# Patient Record
Sex: Male | Born: 1955
Health system: Southern US, Community
[De-identification: ages and names within clinical notes are randomized; demographics above are authoritative.]

## PROBLEM LIST (undated history)

## (undated) DIAGNOSIS — D72829 Elevated white blood cell count, unspecified: Secondary | ICD-10-CM

## (undated) DIAGNOSIS — J189 Pneumonia, unspecified organism: Secondary | ICD-10-CM

## (undated) DIAGNOSIS — E785 Hyperlipidemia, unspecified: Secondary | ICD-10-CM

## (undated) DIAGNOSIS — M5136 Other intervertebral disc degeneration, lumbar region: Secondary | ICD-10-CM

## (undated) DIAGNOSIS — F419 Anxiety disorder, unspecified: Secondary | ICD-10-CM

## (undated) DIAGNOSIS — I10 Essential (primary) hypertension: Secondary | ICD-10-CM

## (undated) DIAGNOSIS — M51369 Other intervertebral disc degeneration, lumbar region without mention of lumbar back pain or lower extremity pain: Secondary | ICD-10-CM

## (undated) HISTORY — DX: Other intervertebral disc degeneration, lumbar region without mention of lumbar back pain or lower extremity pain: M51.369

## (undated) HISTORY — DX: Essential (primary) hypertension: I10

## (undated) HISTORY — DX: Hyperlipidemia, unspecified: E78.5

## (undated) HISTORY — DX: Elevated white blood cell count, unspecified: D72.829

## (undated) HISTORY — PX: TONSILLECTOMY: SUR1361

## (undated) HISTORY — DX: Other intervertebral disc degeneration, lumbar region: M51.36

## (undated) HISTORY — PX: TOTAL HIP ARTHROPLASTY: SHX124

---

## 2011-06-11 ENCOUNTER — Encounter (HOSPITAL_COMMUNITY): Payer: 59 | Attending: Oncology | Admitting: Oncology

## 2011-06-11 ENCOUNTER — Encounter (HOSPITAL_COMMUNITY): Payer: Self-pay | Admitting: Oncology

## 2011-06-11 VITALS — BP 124/76 | HR 84 | Temp 97.9°F | Ht 71.0 in | Wt 202.6 lb

## 2011-06-11 DIAGNOSIS — B029 Zoster without complications: Secondary | ICD-10-CM

## 2011-06-11 DIAGNOSIS — D72829 Elevated white blood cell count, unspecified: Secondary | ICD-10-CM | POA: Insufficient documentation

## 2011-06-11 DIAGNOSIS — J449 Chronic obstructive pulmonary disease, unspecified: Secondary | ICD-10-CM | POA: Insufficient documentation

## 2011-06-11 DIAGNOSIS — F17200 Nicotine dependence, unspecified, uncomplicated: Secondary | ICD-10-CM | POA: Insufficient documentation

## 2011-06-11 DIAGNOSIS — J4489 Other specified chronic obstructive pulmonary disease: Secondary | ICD-10-CM | POA: Insufficient documentation

## 2011-06-11 LAB — DIFFERENTIAL
Eosinophils Relative: 1 % (ref 0–5)
Lymphocytes Relative: 25 % (ref 12–46)
Monocytes Absolute: 0.8 10*3/uL (ref 0.1–1.0)
Monocytes Relative: 7 % (ref 3–12)
Neutro Abs: 7.9 10*3/uL — ABNORMAL HIGH (ref 1.7–7.7)

## 2011-06-11 LAB — CBC
HCT: 41.8 % (ref 39.0–52.0)
Hemoglobin: 15.2 g/dL (ref 13.0–17.0)
MCHC: 36.4 g/dL — ABNORMAL HIGH (ref 30.0–36.0)
MCV: 92.7 fL (ref 78.0–100.0)
RDW: 13.1 % (ref 11.5–15.5)
WBC: 11.8 10*3/uL — ABNORMAL HIGH (ref 4.0–10.5)

## 2011-06-11 NOTE — Patient Instructions (Signed)
[facility]  Discharge Instructions  RECOMMENDATIONS MADE BY THE CONSULTANT AND ANY TEST RESULTS WILL BE SENT TO YOUR REFERRING DOCTOR.   EXAM FINDINGS BY MD TODAY AND SIGNS AND SYMPTOMS TO REPORT TO CLINIC OR PRIMARY MD: need to stop smoking.  Will try you on zyban to see if it will help.  MEDICATIONS PRESCRIBED: zyban start with 1 pill at bedtime for 3 nights then 1 pill 2 times daily   INSTRUCTIONS GIVEN AND DISCUSSED: Advance Medical Directives  SPECIAL INSTRUCTIONS/FOLLOW-UP:  CT of chest in October then labs and MD appointment in November  I acknowledge that I have been informed and understand all the instructions given to me and received a copy. I do not have any more questions at this time, but understand that I may call the Specialty Clinic at [facility] at [phone] during business hours should I have any further questions or need assistance in obtaining follow-up care.    __________________________________________  _____________  __________ Signature of Patient or Authorized Representative            Date                   Time    __________________________________________ Nurse's Signature

## 2011-06-11 NOTE — Progress Notes (Signed)
Addended by: Sterling Big on: 06/11/2011 03:36 PM   Modules accepted: Orders

## 2011-06-11 NOTE — Progress Notes (Signed)
ict

## 2011-06-11 NOTE — Progress Notes (Signed)
CC:   Helene Kelp, PA-C  DIAGNOSES: 1. Leukocytosis, unclear etiology. 2. History of herpes zoster, February, 2012 unprovoked. 3. Chronic obstructive pulmonary disease with a greater than 40 to 50     pack year history of smoking. He smokes a pack and a half of     cigarettes a day, has done so for 30 years essentially. 4. History of tonsillectomy many years ago as a child. 5. Hypercholesterolemia.  This is a very pleasant gentleman, 55 year old, who I think looks slightly older than his stated age, who was referred because of chronic elevation of his white cells since July of last year. I do not have blood work prior to that. At that time in July of 2011, his white count was 11,600 with a slight increase in lymphocytes and granulocytes.  His hemoglobin and platelets were normal at that time. MCV was 99.5.  He had a white count then in December, 2011 of 11,000.  Normal hemoglobin and normal platelets and this time the differential showed a slight increase in lymphocytes to 4000.  His neutrophils were normal.  This year in August his white count was 12,900 at this time with 9,500 so it is a slight increase, normal numbers of lymphocytes and normal hemoglobin and normal platelet count.  He does cough at times. He does not bring up phlegm and does not bring up blood. He is not losing weight. Appetite is excellent. Bowel function is fine, but he has not had  colonoscopy ever.  He has no GU functional problems.  He is not aware of any lumps or bumps anywhere.  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  He is married. He has one son. His wife and his son are nonsmokers and they are both in good health he states.  He does use about 4 beers a day and again he smokes a pack and a half a day.  CURRENT MEDICATIONS:  His medications include atorvastatin, Benicar, hydrochlorothiazide and hydrocodone p.r.n. for pain.  ALLERGIES:  Penicillin.  PHYSICAL EXAMINATION:   General:  He is a pleasant gentleman in no acute distress.  His height is 5 feet 11 inches.  Weight is 202 pounds.  BMI 28.3.  Blood pressure 120/76 right arm sitting position.  Pulse 80 and regular.  Respiratory rate 14 to 16 and unlabored.  He has no obvious hepatosplenomegaly. He does have a lymph node that is about a centimeter and a half in the left supraclavicular fossa, but it is soft. There is a questionable small lymph node about 5 to 6 mm in the right supraclavicular fossa and low neck area on the right.  I did not feel enlarged inguinal nodes, axillary nodes, etc.  He had no infraclavicular nodes. His lungs showed diminished breath sounds, but right now they were clear.  His heart showed a regular rhythm and rate without murmur, rub or gallop. He had no gynecomastia. He did have a patch of scars on the right lateral mid axillary line around T6 consistent with old zoster. He states that he actually had that this February and forgot to mention it in his history.  He has no leg edema, pulse 1 to 2 plus and symmetric.  Nails are unremarkable. He is alert, he is oriented.  Facial symmetry is intact. Throat is clear. Tongue is normal to midline. Pupils are equally round and reactive to light.  Teeth are in fair repair.  This gentleman I think has some  things to worry about, maybe mild leukocytosis, but it is not a single cell type, so I do not think we need to send off flow cytometry at this juncture.  I would like to repeat a CBC and definitely do a smear.  Next, he has longstanding smoking history and he had unprovoked shingles. He did not have infection or surgery, etc. I do think that he needs a CT of the chest to rule out underlying or occult lung malignancy. He needs a repeat CBC and diff after 8 weeks of cessation of smoking to see if his bronchitis, which is chronic, is causing his elevation in leukocytes and I would like him to come back and see me in 8 weeks. I would like  to try him on Zyban if he is willing to try that after discussing it, I think that might help him. I really need to see what his counts are off cigarettes. He is willing to proceed with this.  We will see him as scheduled.    ______________________________ Ladona Horns. Mariel Sleet, MD ESN/MEDQ  D:  06/11/2011  T:  06/11/2011  Job:  [id number]

## 2011-06-27 ENCOUNTER — Other Ambulatory Visit (HOSPITAL_COMMUNITY): Payer: Self-pay | Admitting: Oncology

## 2011-06-27 ENCOUNTER — Ambulatory Visit (HOSPITAL_COMMUNITY)
Admission: RE | Admit: 2011-06-27 | Discharge: 2011-06-27 | Disposition: A | Discharge status: Home / Self Care | Payer: 59 | Source: Ambulatory Visit | Attending: Oncology | Admitting: Oncology

## 2011-06-27 DIAGNOSIS — B028 Zoster with other complications: Secondary | ICD-10-CM | POA: Insufficient documentation

## 2011-06-27 DIAGNOSIS — R911 Solitary pulmonary nodule: Secondary | ICD-10-CM

## 2011-06-27 DIAGNOSIS — F17200 Nicotine dependence, unspecified, uncomplicated: Secondary | ICD-10-CM | POA: Insufficient documentation

## 2011-06-27 DIAGNOSIS — R7989 Other specified abnormal findings of blood chemistry: Secondary | ICD-10-CM | POA: Insufficient documentation

## 2011-06-27 DIAGNOSIS — D72829 Elevated white blood cell count, unspecified: Secondary | ICD-10-CM

## 2011-08-06 ENCOUNTER — Encounter (HOSPITAL_COMMUNITY): Payer: 59 | Attending: Oncology

## 2011-08-06 ENCOUNTER — Encounter (HOSPITAL_BASED_OUTPATIENT_CLINIC_OR_DEPARTMENT_OTHER): Payer: 59 | Admitting: Oncology

## 2011-08-06 VITALS — BP 138/73 | HR 78 | Temp 98.0°F | Wt 202.0 lb

## 2011-08-06 DIAGNOSIS — J449 Chronic obstructive pulmonary disease, unspecified: Secondary | ICD-10-CM | POA: Insufficient documentation

## 2011-08-06 DIAGNOSIS — J4489 Other specified chronic obstructive pulmonary disease: Secondary | ICD-10-CM | POA: Insufficient documentation

## 2011-08-06 DIAGNOSIS — D72829 Elevated white blood cell count, unspecified: Secondary | ICD-10-CM | POA: Insufficient documentation

## 2011-08-06 LAB — CBC
HCT: 45.4 % (ref 39.0–52.0)
MCH: 32.9 pg (ref 26.0–34.0)
MCHC: 35.5 g/dL (ref 30.0–36.0)
MCV: 92.8 fL (ref 78.0–100.0)
Platelets: 208 10*3/uL (ref 150–400)
RDW: 12.9 % (ref 11.5–15.5)
WBC: 8.5 10*3/uL (ref 4.0–10.5)

## 2011-08-06 LAB — DIFFERENTIAL
Basophils Absolute: 0.1 10*3/uL (ref 0.0–0.1)
Basophils Relative: 1 % (ref 0–1)
Eosinophils Absolute: 0.1 10*3/uL (ref 0.0–0.7)
Eosinophils Relative: 2 % (ref 0–5)
Lymphocytes Relative: 28 % (ref 12–46)
Monocytes Absolute: 0.7 10*3/uL (ref 0.1–1.0)

## 2011-08-06 MED ORDER — BUPROPION HCL ER (SMOKING DET) 150 MG PO TB12: 150.0000 mg | ORAL_TABLET | Freq: Two times a day (BID) | ORAL | Status: DC

## 2011-08-06 NOTE — Progress Notes (Signed)
This office note has been dictated.

## 2011-08-06 NOTE — Patient Instructions (Signed)
Louis [name]  [id number] 1956/02/03   [facility]  Discharge Instructions  RECOMMENDATIONS MADE BY THE CONSULTANT AND ANY TEST RESULTS WILL BE SENT TO YOUR REFERRING DOCTOR.   EXAM FINDINGS BY MD TODAY AND SIGNS AND SYMPTOMS TO REPORT TO CLINIC OR PRIMARY MD: Exam findings are good per Dr. Mariel Sleet.  Please continue to work to eliminate your use of tobacco/smoking.  MEDICATIONS PRESCRIBED: Zyban reordered with refills x 3.  I acknowledge that I have been informed and understand all the instructions given to me and received a copy. I do not have any more questions at this time, but understand that I may call the Specialty Clinic at [facility] at [phone] during business hours should I have any further questions or need assistance in obtaining follow-up care.    __________________________________________  _____________  __________ Signature of Patient or Authorized Representative            Date                   Time    __________________________________________ Nurse's Signature

## 2011-08-06 NOTE — Progress Notes (Signed)
Labs drawn today for cbc,diff 

## 2011-08-06 NOTE — Progress Notes (Signed)
CC:   Ernestina Penna, M.D.  DIAGNOSES: 1. Leukocytosis, which is now resolved. 2. Chronic obstructive pulmonary disease secondary to a longstanding     smoking history and he has been able to quit now for the last 4 to     5 weeks. 3. History of herpes zoster in February 2012. 4. Tonsillectomy years ago as a child. 5. Hypercholesterolemia.  Mr. Olmeda is here today for followup.  He has been able to quit smoking and interestingly his white count is perfectly normal at 8500, differential is unremarkable.  Hemoglobin is normal at 16.1 g, platelets normal at 208,000, differential again unremarkable, and a CT scan of the chest without contrast done on October 12th revealed that he has centrilobular emphysema without question.  There is no evidence for lung cancer, however.  There was a subpleural lingular nodule felt to be a lymph node, but with his smoking history we need to do another low-dose CT scan next year either way.  Again, there was no evidence of malignancy.  With his blood counts perfectly normal and he is doing very, very well on the Zyban 150 mg twice a day, we will continue the Zyban, call in refills x3, and see him back in late February to repeat his counts to make sure that they are staying the same.  I did review his blood smear today, which shows an occasional large platelet.  He has some atypical lymphocytes, but no malignant-appearing cells.  He had 1 myelocyte seen versus a metamyelocyte, but I believe it was a myelocyte.  He had several 5-lobed polys, but they appeared normal.  So we will see him back.  As mentioned, he is doing very well right now, asymptomatic. Again, his blood counts are all in the normal range with an unremarkable differential.  So will let him go, see him back in 2013 as planned and a CT scan needs to be done next year.    ______________________________ Ladona Horns. Mariel Sleet, MD ESN/MEDQ  D:  08/06/2011  T:  08/06/2011  Job:  [id number]

## 2011-11-11 ENCOUNTER — Ambulatory Visit (HOSPITAL_COMMUNITY): Payer: 59 | Admitting: Oncology

## 2011-11-11 ENCOUNTER — Other Ambulatory Visit (HOSPITAL_COMMUNITY): Payer: 59

## 2012-01-05 ENCOUNTER — Other Ambulatory Visit (HOSPITAL_COMMUNITY): Payer: Self-pay | Admitting: Oncology

## 2012-01-05 DIAGNOSIS — J449 Chronic obstructive pulmonary disease, unspecified: Secondary | ICD-10-CM

## 2012-01-05 DIAGNOSIS — D72829 Elevated white blood cell count, unspecified: Secondary | ICD-10-CM

## 2012-01-05 MED ORDER — BUPROPION HCL ER (SMOKING DET) 150 MG PO TB12: 150.0000 mg | ORAL_TABLET | Freq: Two times a day (BID) | ORAL | Status: DC

## 2012-08-17 ENCOUNTER — Other Ambulatory Visit (HOSPITAL_COMMUNITY): Payer: Self-pay | Admitting: Oncology

## 2012-08-17 DIAGNOSIS — D72829 Elevated white blood cell count, unspecified: Secondary | ICD-10-CM

## 2012-08-17 DIAGNOSIS — J449 Chronic obstructive pulmonary disease, unspecified: Secondary | ICD-10-CM

## 2012-08-17 MED ORDER — BUPROPION HCL ER (SMOKING DET) 150 MG PO TB12: 150.0000 mg | ORAL_TABLET | Freq: Two times a day (BID) | ORAL | Status: DC

## 2012-12-30 ENCOUNTER — Telehealth: Payer: Self-pay | Admitting: Physician Assistant

## 2012-12-30 DIAGNOSIS — I10 Essential (primary) hypertension: Secondary | ICD-10-CM

## 2012-12-30 DIAGNOSIS — D72829 Elevated white blood cell count, unspecified: Secondary | ICD-10-CM

## 2012-12-30 NOTE — Telephone Encounter (Signed)
Patient requested a cheaper antihypertensive than Benicar at his appt in Nov 2013 with Helene Kelp, PA-C.  He was switched to Amlodipine 10mg  and has noticed swelling in his feet when he takes it.  He has tried alternating not taking it for a week at a time and the swelling resolves when he isn't on it. He would like to switch back to Benicar/HCT 40/12.5.   (I set the prescription up for you to refill if you approve it.  Samples would be appreciated as well.)

## 2012-12-31 NOTE — Telephone Encounter (Signed)
Let us use Losartan/hct 100/25 one daily #30 Refill x 1. instead OV in 6 weeks. D/C the norvasc.

## 2013-01-03 MED ORDER — LOSARTAN POTASSIUM-HCTZ 100-25 MG PO TABS: 1.0000 | ORAL_TABLET | Freq: Every day | ORAL | Status: DC

## 2013-01-03 NOTE — Telephone Encounter (Signed)
Losartan is equivalent to Benicar and cheaper.

## 2013-01-03 NOTE — Telephone Encounter (Signed)
PT AWARE TO STOP THE NORVASC AND PICK UP NEW RX.

## 2013-02-26 ENCOUNTER — Other Ambulatory Visit: Payer: Self-pay | Admitting: *Deleted

## 2013-02-26 DIAGNOSIS — I10 Essential (primary) hypertension: Secondary | ICD-10-CM

## 2013-02-26 NOTE — Telephone Encounter (Signed)
You filled this last time, last seen 12/13, no future appts scheduled. If denied nurse must let him know

## 2013-02-28 MED ORDER — LOSARTAN POTASSIUM-HCTZ 100-25 MG PO TABS: 1.0000 | ORAL_TABLET | Freq: Every day | ORAL | Status: DC

## 2013-02-28 NOTE — Telephone Encounter (Signed)
Due for Office visit

## 2013-04-30 ENCOUNTER — Other Ambulatory Visit: Payer: Self-pay | Admitting: Family Medicine

## 2013-05-03 NOTE — Telephone Encounter (Signed)
Last seen 07/21/12  ACM

## 2013-05-06 ENCOUNTER — Ambulatory Visit (INDEPENDENT_AMBULATORY_CARE_PROVIDER_SITE_OTHER): Payer: 59 | Admitting: Family Medicine

## 2013-05-06 VITALS — BP 198/100 | HR 88 | Temp 98.7°F | Ht 72.0 in | Wt 215.0 lb

## 2013-05-06 DIAGNOSIS — M549 Dorsalgia, unspecified: Secondary | ICD-10-CM

## 2013-05-06 DIAGNOSIS — D72829 Elevated white blood cell count, unspecified: Secondary | ICD-10-CM

## 2013-05-06 DIAGNOSIS — J069 Acute upper respiratory infection, unspecified: Secondary | ICD-10-CM

## 2013-05-06 DIAGNOSIS — E785 Hyperlipidemia, unspecified: Secondary | ICD-10-CM

## 2013-05-06 DIAGNOSIS — I10 Essential (primary) hypertension: Secondary | ICD-10-CM

## 2013-05-06 MED ORDER — DOXYCYCLINE HYCLATE 50 MG PO CAPS: 50.0000 mg | ORAL_CAPSULE | Freq: Two times a day (BID) | ORAL | Status: DC

## 2013-05-06 MED ORDER — METOPROLOL TARTRATE 50 MG PO TABS: 50.0000 mg | ORAL_TABLET | Freq: Two times a day (BID) | ORAL | Status: DC

## 2013-05-06 MED ORDER — CLONIDINE HCL 0.1 MG PO TABS
0.1000 mg | ORAL_TABLET | Freq: Once | ORAL | Status: AC
  Administered 2013-05-06: 0.1 mg via ORAL

## 2013-05-06 MED ORDER — HYDROCODONE-ACETAMINOPHEN 5-325 MG PO TABS: 1.0000 | ORAL_TABLET | Freq: Four times a day (QID) | ORAL | Status: DC | PRN

## 2013-05-06 MED ORDER — ATORVASTATIN CALCIUM 40 MG PO TABS: 40.0000 mg | ORAL_TABLET | Freq: Every day | ORAL | Status: DC

## 2013-05-06 MED ORDER — METHOCARBAMOL 750 MG PO TABS: 750.0000 mg | ORAL_TABLET | Freq: Two times a day (BID) | ORAL | Status: DC | PRN

## 2013-05-06 MED ORDER — LOSARTAN POTASSIUM-HCTZ 100-25 MG PO TABS: 1.0000 | ORAL_TABLET | Freq: Every day | ORAL | Status: DC

## 2013-05-06 NOTE — Patient Instructions (Signed)

## 2013-05-06 NOTE — Progress Notes (Signed)
  Subjective:    Patient ID: Louis [name], male    DOB: [birth year], 57 y.o.   MRN: [id number]  HPI  This 57 y.o. male presents for evaluation of hypertension.  He has been out of his blood pressure medicine For over 3 months.  He has hx of hyperlipidemia.  He has c/o uri sx's.  Review of Systems No chest pain, SOB, HA, dizziness, vision change, N/V, diarrhea, constipation, dysuria, urinary urgency or frequency, myalgias, arthralgias or rash.     Objective:   Physical Exam Vital signs noted  Well developed well nourished male.  HEENT - Head atraumatic Normocephalic                Eyes - PERRLA, Conjuctiva - clear Sclera- Clear EOMI                Ears - EAC's Wnl TM's Wnl Gross Hearing WNL                Nose - Nares patent                 Throat - oropharanx wnl Respiratory - Lungs CTA bilateral Cardiac - RRR S1 and S2 without murmur GI - Abdomen soft Nontender and bowel sounds active x 4 Extremities - No edema. Neuro - Grossly intact.       Assessment & Plan:  HTN (hypertension) - Plan: cloNIDine (CATAPRES) tablet 0.1 mg, losartan-hydrochlorothiazide (HYZAAR) 100-25 MG per tablet, metoprolol (LOPRESSOR) 50 MG tablet.  Clonidine 0.1mg  po given in clinic and bp comes down to 170/100. Patient advised to get on the lopressor and hyzaar today.   Back pain - Plan: methocarbamol (ROBAXIN) 750 MG tablet, metoprolol (LOPRESSOR) 50 MG tablet  Other and unspecified hyperlipidemia - Plan: atorvastatin (LIPITOR) 40 MG tablet, metoprolol (LOPRESSOR) 50 MG tablet  URI, acute - Plan: doxycycline (VIBRAMYCIN) 50 MG capsule.  Follow up in one month and prn

## 2013-05-09 ENCOUNTER — Telehealth: Payer: Self-pay | Admitting: Family Medicine

## 2013-05-09 ENCOUNTER — Other Ambulatory Visit: Payer: Self-pay | Admitting: Family Medicine

## 2013-05-09 DIAGNOSIS — M549 Dorsalgia, unspecified: Secondary | ICD-10-CM

## 2013-05-09 MED ORDER — HYDROCODONE-ACETAMINOPHEN 5-325 MG PO TABS: 1.0000 | ORAL_TABLET | Freq: Four times a day (QID) | ORAL | Status: DC | PRN

## 2013-05-09 NOTE — Telephone Encounter (Signed)
Tell him to come in and p/u rx

## 2013-05-09 NOTE — Telephone Encounter (Signed)
Patient aware.

## 2013-06-28 ENCOUNTER — Other Ambulatory Visit: Payer: Self-pay | Admitting: Nurse Practitioner

## 2013-06-30 NOTE — Telephone Encounter (Signed)
Patient needs to be seen. Has exceeded time since last visit. Limited quantity refilled. Needs to bring all medications to next appointment.   

## 2013-06-30 NOTE — Telephone Encounter (Signed)
LAST OV 11/13. MED NOT IN EPIC BUT IN PAPER CHART. LAST RF 9/14.

## 2013-07-04 NOTE — Telephone Encounter (Signed)
Left message for pt to return call to sch appt

## 2013-07-22 ENCOUNTER — Ambulatory Visit (INDEPENDENT_AMBULATORY_CARE_PROVIDER_SITE_OTHER): Payer: 59 | Admitting: Family Medicine

## 2013-07-22 ENCOUNTER — Encounter: Payer: Self-pay | Admitting: Family Medicine

## 2013-07-22 VITALS — BP 135/75 | HR 81 | Temp 97.3°F | Ht 72.0 in | Wt 212.2 lb

## 2013-07-22 DIAGNOSIS — M1990 Unspecified osteoarthritis, unspecified site: Secondary | ICD-10-CM

## 2013-07-22 DIAGNOSIS — M549 Dorsalgia, unspecified: Secondary | ICD-10-CM

## 2013-07-22 DIAGNOSIS — Z0000 Encounter for general adult medical examination without abnormal findings: Secondary | ICD-10-CM

## 2013-07-22 DIAGNOSIS — M129 Arthropathy, unspecified: Secondary | ICD-10-CM

## 2013-07-22 DIAGNOSIS — I10 Essential (primary) hypertension: Secondary | ICD-10-CM

## 2013-07-22 LAB — POCT CBC
Granulocyte percent: 58.7 %G (ref 37–80)
HCT, POC: 49.1 % (ref 43.5–53.7)
Hemoglobin: 16.7 g/dL (ref 14.1–18.1)
Lymph, poc: 3.8 — AB (ref 0.6–3.4)
MCH, POC: 31.5 pg — AB (ref 27–31.2)
MCHC: 34 g/dL (ref 31.8–35.4)
MCV: 92.6 fL (ref 80–97)
MPV: 7.3 fL (ref 0–99.8)
POC Granulocyte: 6.1 (ref 2–6.9)
POC LYMPH PERCENT: 36.7 %L (ref 10–50)
Platelet Count, POC: 241 10*3/uL (ref 142–424)
RBC: 5.3 M/uL (ref 4.69–6.13)
RDW, POC: 12.5 %
WBC: 10.4 10*3/uL — AB (ref 4.6–10.2)

## 2013-07-22 MED ORDER — MELOXICAM 15 MG PO TABS: 15.0000 mg | ORAL_TABLET | Freq: Every day | ORAL | Status: DC

## 2013-07-22 MED ORDER — HYDROCODONE-ACETAMINOPHEN 5-325 MG PO TABS: 1.0000 | ORAL_TABLET | Freq: Four times a day (QID) | ORAL | Status: DC | PRN

## 2013-07-22 NOTE — Patient Instructions (Signed)

## 2013-07-22 NOTE — Progress Notes (Signed)
  Subjective:    Patient ID: Louis [name], male    DOB: Jun 17, 1956, 57 y.o.   MRN: [id number]  HPI This 57 y.o. male presents for evaluation of hypertension.  Patient  Has persistent back pain and wants refill on pain meds.  He has Been having arthralgias.   Review of Systems C/o back pain. No chest pain, SOB, HA, dizziness, vision change, N/V, diarrhea, constipation, dysuria, urinary urgency or rash.     Objective:   Physical Exam Vital signs noted  Well developed well nourished male.  HEENT - Head atraumatic Normocephalic                Eyes - PERRLA, Conjuctiva - clear Sclera- Clear EOMI                Ears - EAC's Wnl TM's Wnl Gross Hearing WNL                Nose - Nares patent                 Throat - oropharanx wnl Respiratory - Lungs CTA bilateral Cardiac - RRR S1 and S2 without murmur GI - Abdomen soft Nontender and bowel sounds active x 4 Extremities - No edema. Neuro - Grossly intact.       Assessment & Plan:  Arthritis - Plan: POCT CBC, Uric acid, meloxicam (MOBIC) 15 MG tablet, HYDROcodone-acetaminophen (NORCO) 5-325 MG per tablet  Routine general medical examination at a health care facility - Plan: POCT CBC, Lipid panel, PSA, total and free, Uric acid, Thyroid Panel With TSH, CMP14+EGFR  Unspecified essential hypertension - Plan: POCT CBC  Back pain - Plan: HYDROcodone-acetaminophen (NORCO) 5-325 MG per tablet  Deatra Canter FNP

## 2013-07-23 LAB — CMP14+EGFR
ALT: 29 IU/L (ref 0–44)
AST: 22 IU/L (ref 0–40)
Albumin/Globulin Ratio: 2.2 (ref 1.1–2.5)
Albumin: 4.8 g/dL (ref 3.5–5.5)
Alkaline Phosphatase: 94 IU/L (ref 39–117)
BUN/Creatinine Ratio: 14 (ref 9–20)
BUN: 11 mg/dL (ref 6–24)
CO2: 30 mmol/L — ABNORMAL HIGH (ref 18–29)
Calcium: 9.5 mg/dL (ref 8.7–10.2)
Chloride: 103 mmol/L (ref 97–108)
Creatinine, Ser: 0.78 mg/dL (ref 0.76–1.27)
GFR calc Af Amer: 117 mL/min/{1.73_m2} (ref >59)
GFR calc non Af Amer: 101 mL/min/{1.73_m2} (ref >59)
Globulin, Total: 2.2 g/dL (ref 1.5–4.5)
Glucose: 102 mg/dL — ABNORMAL HIGH (ref 65–99)
Potassium: 4.9 mmol/L (ref 3.5–5.2)
Sodium: 144 mmol/L (ref 134–144)
Total Bilirubin: 0.7 mg/dL (ref 0.0–1.2)
Total Protein: 7 g/dL (ref 6.0–8.5)

## 2013-07-23 LAB — LIPID PANEL
Chol/HDL Ratio: 2.9 ratio units (ref 0.0–5.0)
Cholesterol, Total: 132 mg/dL (ref 100–199)
HDL: 46 mg/dL (ref >39)
LDL Calculated: 51 mg/dL (ref 0–99)
Triglycerides: 174 mg/dL — ABNORMAL HIGH (ref 0–149)
VLDL Cholesterol Cal: 35 mg/dL (ref 5–40)

## 2013-07-23 LAB — PSA, TOTAL AND FREE
PSA, Free Pct: 40 %
PSA, Free: 0.16 ng/mL
PSA: 0.4 ng/mL (ref 0.0–4.0)

## 2013-07-23 LAB — THYROID PANEL WITH TSH
Free Thyroxine Index: 2.2 (ref 1.2–4.9)
T3 Uptake Ratio: 30 % (ref 24–39)
T4, Total: 7.2 ug/dL (ref 4.5–12.0)
TSH: 0.959 u[IU]/mL (ref 0.450–4.500)

## 2013-07-23 LAB — URIC ACID: Uric Acid: 5.5 mg/dL (ref 3.7–8.6)

## 2013-09-30 ENCOUNTER — Ambulatory Visit (INDEPENDENT_AMBULATORY_CARE_PROVIDER_SITE_OTHER): Payer: 59 | Admitting: Family Medicine

## 2013-09-30 VITALS — BP 147/69 | HR 49 | Temp 96.9°F | Ht 72.0 in | Wt 216.0 lb

## 2013-09-30 DIAGNOSIS — J209 Acute bronchitis, unspecified: Secondary | ICD-10-CM

## 2013-09-30 MED ORDER — LEVALBUTEROL HCL 1.25 MG/0.5ML IN NEBU: 1.2500 mg | INHALATION_SOLUTION | Freq: Once | RESPIRATORY_TRACT | Status: DC

## 2013-09-30 MED ORDER — LEVALBUTEROL HCL 1.25 MG/3ML IN NEBU
1.2500 mg | INHALATION_SOLUTION | Freq: Once | RESPIRATORY_TRACT | Status: AC
  Administered 2013-09-30: 1.25 mg via RESPIRATORY_TRACT

## 2013-09-30 MED ORDER — AZITHROMYCIN 250 MG PO TABS: ORAL_TABLET | ORAL | Status: DC

## 2013-09-30 MED ORDER — HYDROCODONE-HOMATROPINE 5-1.5 MG/5ML PO SYRP: 5.0000 mL | ORAL_SOLUTION | Freq: Three times a day (TID) | ORAL | Status: DC | PRN

## 2013-09-30 MED ORDER — METHYLPREDNISOLONE ACETATE 80 MG/ML IJ SUSP
80.0000 mg | Freq: Once | INTRAMUSCULAR | Status: AC
  Administered 2013-09-30: 80 mg via INTRAMUSCULAR

## 2013-09-30 NOTE — Progress Notes (Signed)
   Subjective:    Patient ID: Louis [name], male    DOB: [birth year], 58 y.o.   MRN: [id number]  HPI This 58 y.o. male presents for evaluation of persistent dry hacky cough and uri sx's for Over a week.  He is coughing a lot he states.  He is a cigarette smoker and is trying To cut back he states.   Review of Systems C/o cough and bronchitis sx's   No chest pain, SOB, HA, dizziness, vision change, N/V, diarrhea, constipation, dysuria, urinary urgency or frequency, myalgias, arthralgias or rash.  Objective:   Physical Exam Vital signs noted  Well developed well nourished male.  HEENT - Head atraumatic Normocephalic                Eyes - PERRLA, Conjuctiva - clear Sclera- Clear EOMI                Ears - EAC's Wnl TM's Wnl Gross Hearing WNL                Nose - Nares decreased and boggy                Throat - oropharanx wnl Respiratory - Lungs with expiratory wheezes  Cardiac - RRR S1 and S2 without murmur GI - Abdomen soft Nontender and bowel sounds active x 4   Post neb - Lungs CTA bilateral     Assessment & Plan:  Acute bronchitis - Plan: levalbuterol (XOPENEX) nebulizer solution 1.25 mg, methylPREDNISolone acetate (DEPO-MEDROL) injection 80 mg, azithromycin (ZITHROMAX) 250 MG tablet, HYDROcodone-homatropine (HYCODAN) 5-1.5 MG/5ML syrup.  Symbicort 80/4.5 one puff bid #1 sample given and instructions given on how to use.  Recommend mucinex otc bid for next 2 weeks.  Push po fluids, rest, tylenol and motrin otc prn as directed for fever, arthralgias, and myalgias.  Follow up prn if sx's continue or persist.  Discused DC smoking at length.    Lysbeth Penner FNP

## 2013-09-30 NOTE — Patient Instructions (Signed)

## 2013-10-04 ENCOUNTER — Telehealth: Payer: Self-pay | Admitting: Family Medicine

## 2013-10-05 ENCOUNTER — Other Ambulatory Visit: Payer: Self-pay | Admitting: Family Medicine

## 2013-10-05 DIAGNOSIS — J209 Acute bronchitis, unspecified: Secondary | ICD-10-CM

## 2013-10-05 MED ORDER — HYDROCODONE-HOMATROPINE 5-1.5 MG/5ML PO SYRP: 5.0000 mL | ORAL_SOLUTION | Freq: Three times a day (TID) | ORAL | Status: DC | PRN

## 2013-10-05 NOTE — Telephone Encounter (Signed)
Pt notified RX for Hycodan 158ml was ready for pick up Verbalizes understanding

## 2013-12-15 ENCOUNTER — Telehealth: Payer: Self-pay | Admitting: Family Medicine

## 2013-12-15 NOTE — Telephone Encounter (Signed)
appt for next Friday with bill

## 2013-12-23 ENCOUNTER — Encounter: Payer: Self-pay | Admitting: Family Medicine

## 2013-12-23 ENCOUNTER — Ambulatory Visit (INDEPENDENT_AMBULATORY_CARE_PROVIDER_SITE_OTHER): Payer: 59 | Admitting: Family Medicine

## 2013-12-23 ENCOUNTER — Ambulatory Visit (INDEPENDENT_AMBULATORY_CARE_PROVIDER_SITE_OTHER): Payer: 59

## 2013-12-23 VITALS — BP 122/65 | HR 48 | Temp 97.1°F | Ht 72.0 in

## 2013-12-23 DIAGNOSIS — M7741 Metatarsalgia, right foot: Secondary | ICD-10-CM

## 2013-12-23 DIAGNOSIS — M79609 Pain in unspecified limb: Secondary | ICD-10-CM

## 2013-12-23 DIAGNOSIS — M549 Dorsalgia, unspecified: Secondary | ICD-10-CM

## 2013-12-23 DIAGNOSIS — M129 Arthropathy, unspecified: Secondary | ICD-10-CM

## 2013-12-23 DIAGNOSIS — M7750 Other enthesopathy of unspecified foot: Secondary | ICD-10-CM

## 2013-12-23 DIAGNOSIS — M79671 Pain in right foot: Secondary | ICD-10-CM

## 2013-12-23 DIAGNOSIS — M1990 Unspecified osteoarthritis, unspecified site: Secondary | ICD-10-CM

## 2013-12-23 MED ORDER — NAPROXEN 500 MG PO TABS
500.0000 mg | ORAL_TABLET | Freq: Two times a day (BID) | ORAL | Status: DC
Start: 2013-12-23 — End: 2014-03-30

## 2013-12-23 MED ORDER — HYDROCODONE-ACETAMINOPHEN 5-325 MG PO TABS: 1.0000 | ORAL_TABLET | Freq: Four times a day (QID) | ORAL | Status: DC | PRN

## 2013-12-23 NOTE — Progress Notes (Signed)
   Subjective:    Patient ID: Louis [name], male    DOB: [birth year], 58 y.o.   MRN: [id number]  HPI This 58 y.o. male presents for evaluation of right foot discomfort.   Review of Systems C/o right foot pain No chest pain, SOB, HA, dizziness, vision change, N/V, diarrhea, constipation, dysuria, urinary urgency or frequency, myalgias, arthralgias or rash.     Objective:   Physical Exam Vital signs noted  Well developed well nourished male in NAD Respiratory - Lungs CTA bilateral Cardiac - RRR S1 and S2 without murmur MS - TTP right metatarsal region  Xray Right foot - no fracture     Assessment & Plan:  Right foot pain - Plan: DG Foot Complete Right, naproxen (NAPROSYN) 500 MG tablet, Ambulatory referral to Orthopedic Surgery, HYDROcodone-acetaminophen (NORCO) 5-325 MG per tablet  Metatarsalgia of right foot - Plan: naproxen (NAPROSYN) 500 MG tablet, Ambulatory referral to Orthopedic Surgery, HYDROcodone-acetaminophen (NORCO) 5-325 MG per tablet  Lysbeth Penner FNP

## 2014-03-30 ENCOUNTER — Encounter: Payer: Self-pay | Admitting: Family Medicine

## 2014-03-30 ENCOUNTER — Ambulatory Visit (INDEPENDENT_AMBULATORY_CARE_PROVIDER_SITE_OTHER): Payer: 59 | Admitting: Family Medicine

## 2014-03-30 VITALS — BP 164/82 | HR 50 | Temp 97.6°F | Ht 72.0 in | Wt 215.8 lb

## 2014-03-30 DIAGNOSIS — M545 Low back pain, unspecified: Secondary | ICD-10-CM

## 2014-03-30 DIAGNOSIS — M1990 Unspecified osteoarthritis, unspecified site: Secondary | ICD-10-CM

## 2014-03-30 DIAGNOSIS — M79671 Pain in right foot: Secondary | ICD-10-CM

## 2014-03-30 DIAGNOSIS — M79609 Pain in unspecified limb: Secondary | ICD-10-CM

## 2014-03-30 DIAGNOSIS — M129 Arthropathy, unspecified: Secondary | ICD-10-CM

## 2014-03-30 DIAGNOSIS — M7750 Other enthesopathy of unspecified foot: Secondary | ICD-10-CM

## 2014-03-30 DIAGNOSIS — M7741 Metatarsalgia, right foot: Secondary | ICD-10-CM

## 2014-03-30 MED ORDER — MELOXICAM 15 MG PO TABS: 15.0000 mg | ORAL_TABLET | Freq: Every day | ORAL | Status: DC

## 2014-03-30 MED ORDER — HYDROCODONE-ACETAMINOPHEN 5-325 MG PO TABS: 1.0000 | ORAL_TABLET | Freq: Four times a day (QID) | ORAL | Status: DC | PRN

## 2014-03-30 NOTE — Progress Notes (Signed)
   Subjective:    Patient ID: Louis [name], male    DOB: [birth year], 58 y.o.   MRN: [id number]  HPI  This 58 y.o. male presents for evaluation of severe back pain.  He wants refill on his norco for pain.  He has hx of metatarsalgia and was rx'd pain meds which have helped.  He has hx of arthritis pain and he has seen ortho for inserts for metatarsalgia and this has helped.  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  Review of Systems C/o back pain   No chest pain, SOB, HA, dizziness, vision change, N/V, diarrhea, constipation, dysuria, urinary urgency or frequency, myalgias, arthralgias or rash.  Objective:   Physical Exam Vital signs noted  Well developed well nourished male.  HEENT - Head atraumatic Normocephalic                Eyes - PERRLA, Conjuctiva - clear Sclera- Clear EOMI                Ears - EAC's Wnl TM's Wnl Gross Hearing WNL                Throat - oropharanx wnl Respiratory - Lungs CTA bilateral Cardiac - RRR S1 and S2 without murmur GI - Abdomen soft Nontender and bowel sounds active x 4 MS - TTP LS spine and decreased ROM LS spine.       Assessment & Plan:  Arthritis - Plan: meloxicam (MOBIC) 15 MG tablet, HYDROcodone-acetaminophen (NORCO) 5-325 MG per tablet  Bilateral low back pain without sciatica - Plan: meloxicam (MOBIC) 15 MG tablet, HYDROcodone-acetaminophen (NORCO) 5-325 MG per tablet. Sciatic doughnut  Right foot pain - Improved  Metatarsalgia of right foot - Improved and continue inserts for shoes.  Lysbeth Penner FNP

## 2014-05-10 ENCOUNTER — Other Ambulatory Visit: Payer: Self-pay | Admitting: Family Medicine

## 2014-05-23 ENCOUNTER — Other Ambulatory Visit: Payer: Self-pay | Admitting: Family Medicine

## 2014-05-23 NOTE — Telephone Encounter (Signed)
Last lipids 11/14. ntbs

## 2014-06-02 ENCOUNTER — Ambulatory Visit (INDEPENDENT_AMBULATORY_CARE_PROVIDER_SITE_OTHER): Payer: 59 | Admitting: Family Medicine

## 2014-06-02 ENCOUNTER — Encounter: Payer: Self-pay | Admitting: Family Medicine

## 2014-06-02 VITALS — BP 199/69 | HR 44 | Temp 97.5°F | Ht 72.0 in | Wt 215.4 lb

## 2014-06-02 DIAGNOSIS — M129 Arthropathy, unspecified: Secondary | ICD-10-CM

## 2014-06-02 DIAGNOSIS — M1990 Unspecified osteoarthritis, unspecified site: Secondary | ICD-10-CM

## 2014-06-02 DIAGNOSIS — G4700 Insomnia, unspecified: Secondary | ICD-10-CM

## 2014-06-02 DIAGNOSIS — Z23 Encounter for immunization: Secondary | ICD-10-CM

## 2014-06-02 DIAGNOSIS — E785 Hyperlipidemia, unspecified: Secondary | ICD-10-CM

## 2014-06-02 DIAGNOSIS — M545 Low back pain, unspecified: Secondary | ICD-10-CM

## 2014-06-02 DIAGNOSIS — Z1211 Encounter for screening for malignant neoplasm of colon: Secondary | ICD-10-CM

## 2014-06-02 MED ORDER — PITAVASTATIN CALCIUM 2 MG PO TABS: 2.0000 mg | ORAL_TABLET | ORAL | Status: DC

## 2014-06-02 MED ORDER — DICYCLOMINE HCL 10 MG PO CAPS
10.0000 mg | ORAL_CAPSULE | Freq: Three times a day (TID) | ORAL | Status: DC
Start: 2014-06-02 — End: 2015-04-04

## 2014-06-02 MED ORDER — HYDROCODONE-ACETAMINOPHEN 5-325 MG PO TABS: 1.0000 | ORAL_TABLET | Freq: Four times a day (QID) | ORAL | Status: DC | PRN

## 2014-06-02 NOTE — Progress Notes (Signed)
   Subjective:    Patient ID: Louis [name], male    DOB: Aug 13, 1956, 58 y.o.   MRN: [id number]  HPI Patient comes in with a list of things he would like addressed.  He needs tdap, he need refill on lortab which he takes for back pain and arthritis, he wants to change lipid meds because he is having a lot of myalgias and arthralgias, he would like to try some medicine for insomnia but nothing that he can get addicted to and he would like a muscle relaxer but one that does not cause somnolence and that is affordable through his insurance company.  He would also like referral to GI for colonoscopy.   Review of Systems No chest pain, SOB, HA, dizziness, vision change, N/V, diarrhea, constipation, dysuria, urinary urgency or frequency, myalgias, arthralgias or rash.     Objective:   Physical Exam  Vital signs noted  Well developed well nourished male.  HEENT - Head atraumatic Normocephalic                Eyes - PERRLA, Conjuctiva - clear Sclera- Clear EOMI                Ears - EAC's Wnl TM's Wnl Gross Hearing WNL                Nose - Nares patent                 Throat - oropharanx wnl Respiratory - Lungs CTA bilateral Cardiac - RRR S1 and S2 without murmur GI - Abdomen soft Nontender and bowel sounds active x 4 Extremities - No edema. Neuro - Grossly intact.      Assessment & Plan:  Arthritis - Plan: HYDROcodone-acetaminophen (NORCO) 5-325 MG per tablet and take mobic.  Bilateral low back pain without sciatica - Plan: HYDROcodone-acetaminophen (NORCO) 5-325 MG per tablet  Insomnia - otc melatonin  Other and unspecified hyperlipidemia - DC lipitor and start livalo  Myalgias and muscle cramps - DC lipitor and try livalo and take bentyl 10mg  po qid prn for muscles spasms.  GI referral for colonscopy  TDAP  Lysbeth Penner FNP

## 2014-07-06 ENCOUNTER — Encounter: Payer: Self-pay | Admitting: Family Medicine

## 2014-08-03 ENCOUNTER — Telehealth: Payer: Self-pay | Admitting: Family Medicine

## 2014-08-03 NOTE — Telephone Encounter (Signed)
Appointment scheduled for 12/3 at 10:15

## 2014-08-17 ENCOUNTER — Encounter: Payer: Self-pay | Admitting: Family Medicine

## 2014-08-17 ENCOUNTER — Ambulatory Visit (INDEPENDENT_AMBULATORY_CARE_PROVIDER_SITE_OTHER): Payer: 59 | Admitting: Family Medicine

## 2014-08-17 VITALS — BP 155/77 | HR 50 | Temp 97.0°F | Ht 72.0 in | Wt 209.0 lb

## 2014-08-17 DIAGNOSIS — Z139 Encounter for screening, unspecified: Secondary | ICD-10-CM

## 2014-08-17 DIAGNOSIS — M545 Low back pain, unspecified: Secondary | ICD-10-CM

## 2014-08-17 DIAGNOSIS — E785 Hyperlipidemia, unspecified: Secondary | ICD-10-CM

## 2014-08-17 DIAGNOSIS — M1990 Unspecified osteoarthritis, unspecified site: Secondary | ICD-10-CM

## 2014-08-17 DIAGNOSIS — M5441 Lumbago with sciatica, right side: Secondary | ICD-10-CM

## 2014-08-17 DIAGNOSIS — I10 Essential (primary) hypertension: Secondary | ICD-10-CM

## 2014-08-17 MED ORDER — METHOCARBAMOL 750 MG PO TABS: 750.0000 mg | ORAL_TABLET | Freq: Two times a day (BID) | ORAL | Status: DC | PRN

## 2014-08-17 MED ORDER — HYDROCODONE-ACETAMINOPHEN 5-325 MG PO TABS: 1.0000 | ORAL_TABLET | Freq: Four times a day (QID) | ORAL | Status: DC | PRN

## 2014-08-17 NOTE — Addendum Note (Signed)
Addended by: Earlene Plater on: 08/17/2014 03:06 PM   Modules accepted: Orders, SmartSet

## 2014-08-17 NOTE — Addendum Note (Signed)
Addended by: Earlene Plater on: 08/17/2014 03:10 PM   Modules accepted: Orders, SmartSet

## 2014-08-17 NOTE — Progress Notes (Signed)
   Subjective:    Patient ID: Louis [name], male    DOB: August 17, 1956, 58 y.o.   MRN: [id number]  HPI Patient is here for CPE and refill on pain medication.  He has had flu shot this year.  He has hx of chronic back pain.   Review of Systems  Constitutional: Negative for fever.  HENT: Negative for ear pain.   Eyes: Negative for discharge.  Respiratory: Negative for cough.   Cardiovascular: Negative for chest pain.  Gastrointestinal: Negative for abdominal distention.  Endocrine: Negative for polyuria.  Genitourinary: Negative for difficulty urinating.  Musculoskeletal: Negative for gait problem and neck pain.  Skin: Negative for color change and rash.  Neurological: Negative for speech difficulty and headaches.  Psychiatric/Behavioral: Negative for agitation.       Objective:    BP 155/77 mmHg  Pulse 50  Temp(Src) 97 F (36.1 C) (Oral)  Ht 6' (1.829 m)  Wt 209 lb (94.802 kg)  BMI 28.34 kg/m2 Physical Exam  Constitutional: He is oriented to person, place, and time. He appears well-developed and well-nourished.  HENT:  Head: Normocephalic and atraumatic.  Mouth/Throat: Oropharynx is clear and moist.  Eyes: Pupils are equal, round, and reactive to light.  Neck: Normal range of motion. Neck supple.  Cardiovascular: Normal rate and regular rhythm.   No murmur heard. Pulmonary/Chest: Effort normal and breath sounds normal.  Abdominal: Soft. Bowel sounds are normal. There is no tenderness.  Neurological: He is alert and oriented to person, place, and time.  Skin: Skin is warm and dry.  Psychiatric: He has a normal mood and affect.          Assessment & Plan:     ICD-9-CM ICD-10-CM   1. Hyperlipemia 272.4 E78.5 Lipid panel  2. Essential hypertension, benign 401.1 I10 POCT CBC     CMP14+EGFR  3. Screening V82.9 Z13.9 PSA, total and free     Thyroid Panel With TSH  4. Low back pain with right-sided sciatica, unspecified back pain laterality 724.3 M54.41 methocarbamol  (ROBAXIN) 750 MG tablet  5. Arthritis 716.90 M19.90 HYDROcodone-acetaminophen (NORCO) 5-325 MG per tablet  6. Bilateral low back pain without sciatica 724.2 M54.5 HYDROcodone-acetaminophen (NORCO) 5-325 MG per tablet     Return if symptoms worsen or fail to improve.  Lysbeth Penner FNP

## 2014-08-18 LAB — PSA, TOTAL AND FREE
PSA, Free Pct: 32.5 %
PSA, Free: 0.13 ng/mL
PSA: 0.4 ng/mL (ref 0.0–4.0)

## 2014-08-18 LAB — CMP14+EGFR
ALT: 47 IU/L — ABNORMAL HIGH (ref 0–44)
AST: 33 IU/L (ref 0–40)
Albumin/Globulin Ratio: 1.9 (ref 1.1–2.5)
Albumin: 4.5 g/dL (ref 3.5–5.5)
Alkaline Phosphatase: 81 IU/L (ref 39–117)
BUN/Creatinine Ratio: 10 (ref 9–20)
BUN: 9 mg/dL (ref 6–24)
CO2: 26 mmol/L (ref 18–29)
Calcium: 9.8 mg/dL (ref 8.7–10.2)
Chloride: 101 mmol/L (ref 97–108)
Creatinine, Ser: 0.94 mg/dL (ref 0.76–1.27)
GFR calc Af Amer: 104 mL/min/{1.73_m2} (ref >59)
GFR calc non Af Amer: 90 mL/min/{1.73_m2} (ref >59)
Globulin, Total: 2.4 g/dL (ref 1.5–4.5)
Glucose: 105 mg/dL — ABNORMAL HIGH (ref 65–99)
Potassium: 5.5 mmol/L — ABNORMAL HIGH (ref 3.5–5.2)
Sodium: 142 mmol/L (ref 134–144)
Total Bilirubin: 1.1 mg/dL (ref 0.0–1.2)
Total Protein: 6.9 g/dL (ref 6.0–8.5)

## 2014-08-18 LAB — CBC WITH DIFFERENTIAL
Basophils Absolute: 0.1 10*3/uL (ref 0.0–0.2)
Basos: 1 %
Eos: 2 %
Eosinophils Absolute: 0.2 10*3/uL (ref 0.0–0.4)
HCT: 47.3 % (ref 37.5–51.0)
Hemoglobin: 16.7 g/dL (ref 12.6–17.7)
Immature Grans (Abs): 0 10*3/uL (ref 0.0–0.1)
Immature Granulocytes: 0 %
Lymphocytes Absolute: 4.1 10*3/uL — ABNORMAL HIGH (ref 0.7–3.1)
Lymphs: 38 %
MCH: 33.1 pg — ABNORMAL HIGH (ref 26.6–33.0)
MCHC: 35.3 g/dL (ref 31.5–35.7)
MCV: 94 fL (ref 79–97)
Monocytes Absolute: 0.8 10*3/uL (ref 0.1–0.9)
Monocytes: 7 %
Neutrophils Absolute: 5.7 10*3/uL (ref 1.4–7.0)
Neutrophils Relative %: 52 %
Platelets: 235 10*3/uL (ref 150–379)
RBC: 5.05 x10E6/uL (ref 4.14–5.80)
RDW: 13.1 % (ref 12.3–15.4)
WBC: 10.8 10*3/uL (ref 3.4–10.8)

## 2014-08-18 LAB — THYROID PANEL WITH TSH
Free Thyroxine Index: 2.1 (ref 1.2–4.9)
T3 Uptake Ratio: 28 % (ref 24–39)
T4, Total: 7.6 ug/dL (ref 4.5–12.0)
TSH: 0.798 u[IU]/mL (ref 0.450–4.500)

## 2014-08-18 LAB — LIPID PANEL
Chol/HDL Ratio: 2.6 ratio units (ref 0.0–5.0)
Cholesterol, Total: 136 mg/dL (ref 100–199)
HDL: 52 mg/dL (ref >39)
LDL Calculated: 63 mg/dL (ref 0–99)
Triglycerides: 103 mg/dL (ref 0–149)
VLDL Cholesterol Cal: 21 mg/dL (ref 5–40)

## 2014-08-25 ENCOUNTER — Other Ambulatory Visit (INDEPENDENT_AMBULATORY_CARE_PROVIDER_SITE_OTHER): Payer: 59

## 2014-08-25 DIAGNOSIS — E875 Hyperkalemia: Secondary | ICD-10-CM

## 2014-08-25 NOTE — Progress Notes (Signed)
Lab only 

## 2014-08-26 LAB — BMP8+EGFR
BUN/Creatinine Ratio: 13 (ref 9–20)
BUN: 11 mg/dL (ref 6–24)
CO2: 26 mmol/L (ref 18–29)
Calcium: 8.9 mg/dL (ref 8.7–10.2)
Chloride: 101 mmol/L (ref 97–108)
Creatinine, Ser: 0.88 mg/dL (ref 0.76–1.27)
GFR calc Af Amer: 110 mL/min/{1.73_m2} (ref >59)
GFR calc non Af Amer: 95 mL/min/{1.73_m2} (ref >59)
Glucose: 90 mg/dL (ref 65–99)
Potassium: 4.4 mmol/L (ref 3.5–5.2)
Sodium: 141 mmol/L (ref 134–144)

## 2014-08-28 ENCOUNTER — Telehealth: Payer: Self-pay | Admitting: *Deleted

## 2014-08-28 NOTE — Telephone Encounter (Signed)
Aware, normal potassium.

## 2014-08-28 NOTE — Telephone Encounter (Signed)
-----   Message from Lysbeth Penner, FNP sent at 08/28/2014 10:22 AM EST ----- K is normal

## 2014-10-13 ENCOUNTER — Ambulatory Visit (INDEPENDENT_AMBULATORY_CARE_PROVIDER_SITE_OTHER): Payer: 59 | Admitting: Family Medicine

## 2014-10-13 ENCOUNTER — Encounter: Payer: Self-pay | Admitting: Family Medicine

## 2014-10-13 VITALS — BP 172/80 | HR 44 | Temp 97.0°F | Ht 72.0 in | Wt 209.0 lb

## 2014-10-13 DIAGNOSIS — J069 Acute upper respiratory infection, unspecified: Secondary | ICD-10-CM

## 2014-10-13 DIAGNOSIS — I10 Essential (primary) hypertension: Secondary | ICD-10-CM

## 2014-10-13 MED ORDER — METHYLPREDNISOLONE ACETATE 80 MG/ML IJ SUSP
80.0000 mg | Freq: Once | INTRAMUSCULAR | Status: AC
  Administered 2014-10-13: 80 mg via INTRAMUSCULAR

## 2014-10-13 MED ORDER — AZITHROMYCIN 250 MG PO TABS: ORAL_TABLET | ORAL | Status: DC

## 2014-10-13 MED ORDER — AMLODIPINE BESYLATE 5 MG PO TABS
5.0000 mg | ORAL_TABLET | Freq: Every day | ORAL | Status: DC
Start: 2014-10-13 — End: 2015-04-04

## 2014-10-13 NOTE — Progress Notes (Signed)
   Subjective:    Patient ID: Louis [name], male    DOB: 1956/02/03, 59 y.o.   MRN: [id number]  HPI Patient c/o uri sinus congestion and fatigue.  He is only taking metoprolol qd instead of BID due to bradycardia.  He has been having elevated bp readings.  Review of Systems  Constitutional: Negative for fever.  HENT: Negative for ear pain.   Eyes: Negative for discharge.  Respiratory: Negative for cough.   Cardiovascular: Negative for chest pain.  Gastrointestinal: Negative for abdominal distention.  Endocrine: Negative for polyuria.  Genitourinary: Negative for difficulty urinating.  Musculoskeletal: Negative for gait problem and neck pain.  Skin: Negative for color change and rash.  Neurological: Negative for speech difficulty and headaches.  Psychiatric/Behavioral: Negative for agitation.       Objective:    BP 172/80 mmHg  Pulse 44  Temp(Src) 97 F (36.1 C) (Oral)  Ht 6' (1.829 m)  Wt 209 lb (94.802 kg)  BMI 28.34 kg/m2 Physical Exam  Constitutional: He is oriented to person, place, and time. He appears well-developed and well-nourished.  HENT:  Head: Normocephalic and atraumatic.  Mouth/Throat: Oropharynx is clear and moist.  Eyes: Pupils are equal, round, and reactive to light.  Neck: Normal range of motion. Neck supple.  Cardiovascular: Normal rate and regular rhythm.   No murmur heard. Pulmonary/Chest: Effort normal and breath sounds normal.  Abdominal: Soft. Bowel sounds are normal. There is no tenderness.  Neurological: He is alert and oriented to person, place, and time.  Skin: Skin is warm and dry.  Psychiatric: He has a normal mood and affect.          Assessment & Plan:     ICD-9-CM ICD-10-CM   1. URI (upper respiratory infection) 465.9 J06.9 azithromycin (ZITHROMAX) 250 MG tablet     methylPREDNISolone acetate (DEPO-MEDROL) injection 80 mg  2. Essential hypertension 401.9 I10 amLODipine (NORVASC) 5 MG tablet   Discussed with patient that he  needs to follow up in 2-3 weeks for BP follow up.  No Follow-up on file.  Lysbeth Penner FNP

## 2014-10-13 NOTE — Addendum Note (Signed)
Addended by: Marin Olp on: 10/13/2014 09:10 AM   Modules accepted: Miquel Dunn

## 2014-11-21 ENCOUNTER — Other Ambulatory Visit: Payer: Self-pay | Admitting: Family Medicine

## 2015-04-04 ENCOUNTER — Encounter: Payer: Self-pay | Admitting: Physician Assistant

## 2015-04-04 ENCOUNTER — Ambulatory Visit (INDEPENDENT_AMBULATORY_CARE_PROVIDER_SITE_OTHER): Payer: 59 | Admitting: Physician Assistant

## 2015-04-04 ENCOUNTER — Encounter (INDEPENDENT_AMBULATORY_CARE_PROVIDER_SITE_OTHER): Payer: Self-pay

## 2015-04-04 VITALS — BP 145/67 | HR 43 | Temp 97.1°F | Ht 72.0 in | Wt 200.8 lb

## 2015-04-04 DIAGNOSIS — I10 Essential (primary) hypertension: Secondary | ICD-10-CM

## 2015-04-04 DIAGNOSIS — E785 Hyperlipidemia, unspecified: Secondary | ICD-10-CM

## 2015-04-04 DIAGNOSIS — L03019 Cellulitis of unspecified finger: Secondary | ICD-10-CM

## 2015-04-04 MED ORDER — TERBINAFINE HCL 250 MG PO TABS: 250.0000 mg | ORAL_TABLET | Freq: Every day | ORAL | Status: DC

## 2015-04-04 MED ORDER — METOPROLOL TARTRATE 50 MG PO TABS: 50.0000 mg | ORAL_TABLET | Freq: Two times a day (BID) | ORAL | Status: DC

## 2015-04-04 MED ORDER — AMLODIPINE BESYLATE 5 MG PO TABS: 5.0000 mg | ORAL_TABLET | Freq: Every day | ORAL | Status: DC

## 2015-04-04 NOTE — Patient Instructions (Signed)

## 2015-04-04 NOTE — Progress Notes (Signed)
Subjective:     Patient ID: Louis [name], male   DOB: 1956-08-16, 59 y.o.   MRN: [id number]  HPI Pt here with several complaints #1- he is overdue for f/u of HTN and hyperlipid Denies CP,SOB, or lower ext edema #2- he has had a thickened nail to the R thumb Denies any trauma to the thumb or nail Sx x 3 months  Review of Systems  Constitutional: Negative.   HENT: Negative.   Eyes: Negative.   Respiratory: Negative.   Cardiovascular: Negative.   Gastrointestinal: Negative.        Objective:   Physical Exam  Constitutional: He appears well-developed and well-nourished.  HENT:  Mouth/Throat: Oropharynx is clear and moist. No oropharyngeal exudate.  Neck: Neck supple. No JVD present.  Cardiovascular: Normal rate, regular rhythm, normal heart sounds and intact distal pulses.   Pulmonary/Chest: Effort normal and breath sounds normal.  Musculoskeletal: He exhibits no edema.  Lymphadenopathy:    He has no cervical adenopathy.  Nursing note and vitals reviewed. Thickened nails to the thumbs bilat R>L R nail has curved under Note made of same to both of the great toenails bilat No erythema/edema to the nail bed     Assessment:     1. Essential hypertension   2. Onychia of finger, unspecified laterality   3. Hyperlipemia        Plan:     RF of meds for BP today Will start Lamisil for the nails Pt not to start until he hears about his lab results Informed of chronic nature and what to expect Recheck labs in 6 wks if he starts Lamisil Smoking cessation

## 2015-04-05 LAB — CMP14+EGFR
ALBUMIN: 4.6 g/dL (ref 3.5–5.5)
ALT: 26 IU/L (ref 0–44)
AST: 22 IU/L (ref 0–40)
Albumin/Globulin Ratio: 1.9 (ref 1.1–2.5)
Alkaline Phosphatase: 90 IU/L (ref 39–117)
BUN / CREAT RATIO: 13 (ref 9–20)
BUN: 10 mg/dL (ref 6–24)
Bilirubin Total: 0.8 mg/dL (ref 0.0–1.2)
CO2: 26 mmol/L (ref 18–29)
CREATININE: 0.78 mg/dL (ref 0.76–1.27)
Calcium: 9.7 mg/dL (ref 8.7–10.2)
Chloride: 101 mmol/L (ref 97–108)
GFR, EST AFRICAN AMERICAN: 116 mL/min/{1.73_m2} (ref >59)
GFR, EST NON AFRICAN AMERICAN: 100 mL/min/{1.73_m2} (ref >59)
GLOBULIN, TOTAL: 2.4 g/dL (ref 1.5–4.5)
GLUCOSE: 110 mg/dL — AB (ref 65–99)
POTASSIUM: 5.7 mmol/L — AB (ref 3.5–5.2)
SODIUM: 142 mmol/L (ref 134–144)
TOTAL PROTEIN: 7 g/dL (ref 6.0–8.5)

## 2015-04-05 LAB — LIPID PANEL
CHOL/HDL RATIO: 3.5 ratio (ref 0.0–5.0)
Cholesterol, Total: 161 mg/dL (ref 100–199)
HDL: 46 mg/dL (ref >39)
LDL CALC: 84 mg/dL (ref 0–99)
TRIGLYCERIDES: 157 mg/dL — AB (ref 0–149)
VLDL Cholesterol Cal: 31 mg/dL (ref 5–40)

## 2015-04-09 ENCOUNTER — Telehealth: Payer: Self-pay | Admitting: Family Medicine

## 2015-04-09 MED ORDER — LISINOPRIL 20 MG PO TABS: 20.0000 mg | ORAL_TABLET | Freq: Every day | ORAL | Status: DC

## 2015-04-09 NOTE — Telephone Encounter (Signed)
D/c amlodipine and changed to lisinopril - rx sent to pharmacy

## 2015-04-09 NOTE — Telephone Encounter (Signed)
Left detailed mssg regarding BP meds, and that labs will be reviewed soon

## 2015-04-13 ENCOUNTER — Other Ambulatory Visit: Payer: Self-pay | Admitting: *Deleted

## 2015-04-13 DIAGNOSIS — E875 Hyperkalemia: Secondary | ICD-10-CM

## 2015-06-29 ENCOUNTER — Other Ambulatory Visit: Payer: Self-pay | Admitting: *Deleted

## 2015-06-29 MED ORDER — PITAVASTATIN CALCIUM 2 MG PO TABS: 2.0000 mg | ORAL_TABLET | Freq: Every day | ORAL | Status: DC

## 2015-07-03 ENCOUNTER — Other Ambulatory Visit: Payer: Self-pay

## 2015-07-03 MED ORDER — PITAVASTATIN CALCIUM 2 MG PO TABS: 2.0000 mg | ORAL_TABLET | Freq: Every day | ORAL | Status: DC

## 2015-07-18 ENCOUNTER — Other Ambulatory Visit: Payer: Self-pay | Admitting: Nurse Practitioner

## 2015-08-08 ENCOUNTER — Other Ambulatory Visit: Payer: Self-pay | Admitting: Physician Assistant

## 2015-08-08 NOTE — Telephone Encounter (Signed)
Last seen 04/04/15  WLW

## 2015-09-03 ENCOUNTER — Ambulatory Visit (INDEPENDENT_AMBULATORY_CARE_PROVIDER_SITE_OTHER): Payer: 59 | Admitting: Family Medicine

## 2015-09-03 ENCOUNTER — Encounter: Payer: Self-pay | Admitting: Family Medicine

## 2015-09-03 VITALS — BP 132/83 | HR 85 | Temp 97.7°F | Ht 74.2 in | Wt 207.6 lb

## 2015-09-03 DIAGNOSIS — M549 Dorsalgia, unspecified: Secondary | ICD-10-CM | POA: Insufficient documentation

## 2015-09-03 DIAGNOSIS — I10 Essential (primary) hypertension: Secondary | ICD-10-CM

## 2015-09-03 DIAGNOSIS — M5441 Lumbago with sciatica, right side: Secondary | ICD-10-CM

## 2015-09-03 DIAGNOSIS — G5702 Lesion of sciatic nerve, left lower limb: Secondary | ICD-10-CM | POA: Diagnosis not present

## 2015-09-03 DIAGNOSIS — M1990 Unspecified osteoarthritis, unspecified site: Secondary | ICD-10-CM

## 2015-09-03 DIAGNOSIS — Z23 Encounter for immunization: Secondary | ICD-10-CM | POA: Diagnosis not present

## 2015-09-03 DIAGNOSIS — M545 Low back pain, unspecified: Secondary | ICD-10-CM

## 2015-09-03 DIAGNOSIS — Z0000 Encounter for general adult medical examination without abnormal findings: Secondary | ICD-10-CM

## 2015-09-03 DIAGNOSIS — L603 Nail dystrophy: Secondary | ICD-10-CM

## 2015-09-03 DIAGNOSIS — E785 Hyperlipidemia, unspecified: Secondary | ICD-10-CM

## 2015-09-03 MED ORDER — GABAPENTIN 100 MG PO CAPS: 100.0000 mg | ORAL_CAPSULE | Freq: Three times a day (TID) | ORAL | Status: DC

## 2015-09-03 MED ORDER — METHOCARBAMOL 750 MG PO TABS: 750.0000 mg | ORAL_TABLET | Freq: Two times a day (BID) | ORAL | Status: DC | PRN

## 2015-09-03 MED ORDER — PITAVASTATIN CALCIUM 2 MG PO TABS: 2.0000 mg | ORAL_TABLET | Freq: Every day | ORAL | Status: DC

## 2015-09-03 MED ORDER — HYDROCODONE-ACETAMINOPHEN 5-325 MG PO TABS: 1.0000 | ORAL_TABLET | Freq: Four times a day (QID) | ORAL | Status: DC | PRN

## 2015-09-03 NOTE — Patient Instructions (Signed)
Great to meet you!  I have started a new medicine, Called gabapentin Try 1 pill at night, if it doesn't make you sleepy you can take it 3 times daily. If you can tell its helping but the results are not complete you can increase up to 300 mg 3 times a day (call for new rx if this happens)  Lets see you back in 1 year unless you need [place] sooner.

## 2015-09-03 NOTE — Progress Notes (Signed)
   HPI  Patient presents today here for an annual physical exam and to discuss thickened nails and hypertension.  Thickened nails He has his right thumbnail that has been thickened and curling downward for over the last year, he was given a course of Lamisil which did not improve this problem. 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 He also has thickened yellow toenails as well.  Hypertension Medication compliance, he's only on lisinopril. No chest pain, dyspnea, palpitations, leg edema  Back pain left-sided low back pain described as buttock pain with symptoms of numbness and tingling down the posterior left leg He uses Norco occasionally for this, he has not had a refill in one year. He requests refill of Norco, Robaxin He does stretches which helps his pain some. He also uses heat with good improvement  PMH: Smoking status noted ROS: Per HPI  Objective: BP 132/83 mmHg  Pulse 85  Temp(Src) 97.7 F (36.5 C) (Oral)  Ht 6' 2.2" (1.885 m)  Wt 207 lb 9.6 oz (94.167 kg)  BMI 26.50 kg/m2 Gen: NAD, alert, cooperative with exam HEENT: NCAT, TMs normal bilaterally, nares clear, oropharynx clear CV: RRR, good S1/S2, no murmur Resp: CTABL, no wheezes, non-labored Abd: SNTND, BS present, no guarding or organomegaly Ext: No edema, warm Neuro: Alert and oriented, No gross deficits Nails: Right thumbnail curled downward, thickened, and yellowing, also yellowing and thickening of all 10 toenails with the greatest symptoms of bilateral great toes, also thickening of left thumb  Assessment and plan:  # Onychodystrophy Malformed, thickened and downward curving right thumbnail, left thumbnail in bilateral toenails No response Lamisil Unclear etiology Refer to dermatology for further recommendations  # Hypertension Well controlled Continue lisinopril Labs, fasting  # Back pain Refill Norco, 60 pills, 5 mg, last was one year  ago.- Controlled substance database without any refills Continue Tylenol, heat, stretching, Robaxin Caution with NSAIDs as he has upset stomach with this, labs   # Hyperlipidemia Continue livalo, labs   Orders Placed This Encounter  Procedures  . Flu Vaccine QUAD 36+ mos IM  . Lipid panel  . CMP14+EGFR  . CBC with Differential/Platelet    Meds ordered this encounter  Medications  . gabapentin (NEURONTIN) 100 MG capsule    Sig: Take 1 capsule (100 mg total) by mouth 3 (three) times daily.    Dispense:  90 capsule    Refill:  3  . HYDROcodone-acetaminophen (NORCO) 5-325 MG tablet    Sig: Take 1 tablet by mouth every 6 (six) hours as needed.    Dispense:  60 tablet    Refill:  0  . methocarbamol (ROBAXIN) 750 MG tablet    Sig: Take 1 tablet (750 mg total) by mouth 2 (two) times daily as needed.    Dispense:  60 tablet    Refill:  Scott, MD [facility] 09/03/2015, 10:11 AM

## 2015-09-04 LAB — CBC WITH DIFFERENTIAL/PLATELET
BASOS ABS: 0 10*3/uL (ref 0.0–0.2)
Basos: 0 %
EOS (ABSOLUTE): 0.1 10*3/uL (ref 0.0–0.4)
EOS: 0 %
HEMATOCRIT: 47.2 % (ref 37.5–51.0)
HEMOGLOBIN: 17 g/dL (ref 12.6–17.7)
IMMATURE GRANULOCYTES: 0 %
Immature Grans (Abs): 0 10*3/uL (ref 0.0–0.1)
LYMPHS: 26 %
Lymphocytes Absolute: 3 10*3/uL (ref 0.7–3.1)
MCH: 33.6 pg — ABNORMAL HIGH (ref 26.6–33.0)
MCHC: 36 g/dL — AB (ref 31.5–35.7)
MCV: 93 fL (ref 79–97)
MONOCYTES: 6 %
Monocytes Absolute: 0.7 10*3/uL (ref 0.1–0.9)
NEUTROS PCT: 68 %
Neutrophils Absolute: 7.7 10*3/uL — ABNORMAL HIGH (ref 1.4–7.0)
Platelets: 247 10*3/uL (ref 150–379)
RBC: 5.06 x10E6/uL (ref 4.14–5.80)
RDW: 12.5 % (ref 12.3–15.4)
WBC: 11.6 10*3/uL — AB (ref 3.4–10.8)

## 2015-09-04 LAB — LIPID PANEL
CHOL/HDL RATIO: 3 ratio (ref 0.0–5.0)
Cholesterol, Total: 158 mg/dL (ref 100–199)
HDL: 52 mg/dL (ref >39)
LDL CALC: 76 mg/dL (ref 0–99)
Triglycerides: 151 mg/dL — ABNORMAL HIGH (ref 0–149)
VLDL CHOLESTEROL CAL: 30 mg/dL (ref 5–40)

## 2015-09-04 LAB — CMP14+EGFR
ALBUMIN: 4.7 g/dL (ref 3.5–5.5)
ALK PHOS: 88 IU/L (ref 39–117)
ALT: 37 IU/L (ref 0–44)
AST: 25 IU/L (ref 0–40)
Albumin/Globulin Ratio: 1.8 (ref 1.1–2.5)
BILIRUBIN TOTAL: 0.7 mg/dL (ref 0.0–1.2)
BUN/Creatinine Ratio: 10 (ref 9–20)
BUN: 10 mg/dL (ref 6–24)
CHLORIDE: 97 mmol/L (ref 96–106)
CO2: 26 mmol/L (ref 18–29)
CREATININE: 0.98 mg/dL (ref 0.76–1.27)
Calcium: 9.8 mg/dL (ref 8.7–10.2)
GFR calc Af Amer: 98 mL/min/{1.73_m2} (ref >59)
GFR calc non Af Amer: 85 mL/min/{1.73_m2} (ref >59)
GLUCOSE: 108 mg/dL — AB (ref 65–99)
Globulin, Total: 2.6 g/dL (ref 1.5–4.5)
Potassium: 4.8 mmol/L (ref 3.5–5.2)
Sodium: 139 mmol/L (ref 134–144)
Total Protein: 7.3 g/dL (ref 6.0–8.5)

## 2015-10-11 ENCOUNTER — Other Ambulatory Visit: Payer: Self-pay

## 2015-10-11 MED ORDER — TERBINAFINE HCL 250 MG PO TABS: 250.0000 mg | ORAL_TABLET | Freq: Every day | ORAL | Status: DC

## 2015-10-29 ENCOUNTER — Telehealth: Payer: Self-pay | Admitting: Family Medicine

## 2015-10-29 NOTE — Telephone Encounter (Signed)
Pt aware and is going to come by and get the coupon.

## 2015-10-29 NOTE — Telephone Encounter (Signed)
Will try coupon.  Laroy Apple, MD [facility] 10/29/2015, 4:02 PM

## 2016-04-23 ENCOUNTER — Other Ambulatory Visit: Payer: Self-pay

## 2016-04-23 MED ORDER — LISINOPRIL 20 MG PO TABS
20.0000 mg | ORAL_TABLET | Freq: Every day | ORAL | 0 refills | Status: DC
Start: 2016-04-23 — End: 2016-04-24

## 2016-04-24 ENCOUNTER — Other Ambulatory Visit: Payer: Self-pay | Admitting: Nurse Practitioner

## 2016-08-18 ENCOUNTER — Ambulatory Visit (INDEPENDENT_AMBULATORY_CARE_PROVIDER_SITE_OTHER): Payer: 59 | Admitting: Family Medicine

## 2016-08-18 ENCOUNTER — Encounter: Payer: Self-pay | Admitting: Family Medicine

## 2016-08-18 VITALS — BP 147/79 | HR 66 | Temp 97.4°F | Ht 74.2 in | Wt 216.4 lb

## 2016-08-18 DIAGNOSIS — G5702 Lesion of sciatic nerve, left lower limb: Secondary | ICD-10-CM

## 2016-08-18 DIAGNOSIS — Z0000 Encounter for general adult medical examination without abnormal findings: Secondary | ICD-10-CM | POA: Diagnosis not present

## 2016-08-18 DIAGNOSIS — M1990 Unspecified osteoarthritis, unspecified site: Secondary | ICD-10-CM

## 2016-08-18 DIAGNOSIS — R911 Solitary pulmonary nodule: Secondary | ICD-10-CM

## 2016-08-18 DIAGNOSIS — E785 Hyperlipidemia, unspecified: Secondary | ICD-10-CM

## 2016-08-18 DIAGNOSIS — I10 Essential (primary) hypertension: Secondary | ICD-10-CM

## 2016-08-18 DIAGNOSIS — Z23 Encounter for immunization: Secondary | ICD-10-CM | POA: Diagnosis not present

## 2016-08-18 MED ORDER — HYDROCODONE-ACETAMINOPHEN 5-325 MG PO TABS: 1.0000 | ORAL_TABLET | Freq: Four times a day (QID) | ORAL | 0 refills | Status: DC | PRN

## 2016-08-18 MED ORDER — PITAVASTATIN CALCIUM 2 MG PO TABS: 2.0000 mg | ORAL_TABLET | Freq: Every day | ORAL | 3 refills | Status: DC

## 2016-08-18 MED ORDER — LISINOPRIL 40 MG PO TABS: 40.0000 mg | ORAL_TABLET | Freq: Every day | ORAL | 3 refills | Status: DC

## 2016-08-18 MED ORDER — MELOXICAM 15 MG PO TABS: 15.0000 mg | ORAL_TABLET | Freq: Every day | ORAL | 1 refills | Status: DC

## 2016-08-18 MED ORDER — LISINOPRIL 20 MG PO TABS: 20.0000 mg | ORAL_TABLET | Freq: Every day | ORAL | 3 refills | Status: DC

## 2016-08-18 NOTE — Progress Notes (Signed)
   HPI  Patient presents today here for annual physical exam  Annual physical exam Patient feels well, has no complaints. He does not exercise, however he is occupationally active. He watches his diet, not portion control, however he tries to avoid fried and fatty foods and avoids eating out.  Hypertension Does not check blood pressure No headaches, chest pain, dyspnea, palpitations, leg edema Good medication compliance  Hyperlipidemia Good medication compliance No side effects  Left buttock pain Patient takes Norco intermittently for what buttock pain with radiation down his left lower extremity.  PMH: Smoking status noted ROS: Per HPI  Objective: BP (!) 147/79   Pulse 66   Temp 97.4 F (36.3 C) (Oral)   Ht 6' 2.2" (1.885 m)   Wt 216 lb 6.4 oz (98.2 kg)   BMI 27.63 kg/m  Gen: NAD, alert, cooperative with exam HEENT: NCAT, EOMI, PERRLA, Tms WNL BL, Oropharynx clear CV: RRR, good S1/S2, no murmur Resp: CTABL, no wheezes, non-labored Abd: SNTND, BS present, no guarding or organomegaly Ext: No edema, warm Neuro: Alert and oriented,  strength 5/5 and sensation intact in bilateral lower extremities.  Assessment and plan:  # Annual physical exam like Normal exam except for what is listed below Discussed therapeutic lifestyle changes. Influenza vaccine given today, counseling provided for all vaccine components.  # Hypertension Elevated today, some persistent elevations over the last few visits except for the very last visit. Patient does not check at home, he would like to increase medication. Increase to lisinopril 40 mg daily, previously 20 mg. Follow-up 2-3 months  # Hyperlipidemia Refill livalo Patient watching his diet  # Piriformis syndrome, arthritis  Uses intermittent hydrocodone, has not had refill in 12 months, refilled X 60 pills today Also given meloxicam which he states he tolerated previously. Will only use intermittently.   Lung nodule On chart  review, 3 mm Lung nodule in 2012 Repeat CT scan, recommended 1 year f/u at that time.     Orders Placed This Encounter  Procedures  . Flu Vaccine QUAD 36+ mos IM  . Lipid panel  . CMP14+EGFR  . CBC with Differential/Platelet  . PSA    Meds ordered this encounter  Medications  . DISCONTD: lisinopril (PRINIVIL,ZESTRIL) 20 MG tablet    Sig: Take 1 tablet (20 mg total) by mouth daily.    Dispense:  90 tablet    Refill:  3  . Pitavastatin Calcium (LIVALO) 2 MG TABS    Sig: Take 1 tablet (2 mg total) by mouth daily.    Dispense:  90 tablet    Refill:  3  . HYDROcodone-acetaminophen (NORCO) 5-325 MG tablet    Sig: Take 1 tablet by mouth every 6 (six) hours as needed.    Dispense:  60 tablet    Refill:  0  . meloxicam (MOBIC) 15 MG tablet    Sig: Take 1 tablet (15 mg total) by mouth daily.    Dispense:  30 tablet    Refill:  1  . lisinopril (PRINIVIL,ZESTRIL) 40 MG tablet    Sig: Take 1 tablet (40 mg total) by mouth daily.    Dispense:  90 tablet    Refill:  Bruce, MD [facility] 08/18/2016, 11:37 AM

## 2016-08-18 NOTE — Patient Instructions (Signed)
Great to see you!   We will call with labs within 1 week 

## 2016-08-19 ENCOUNTER — Telehealth: Payer: Self-pay | Admitting: Family Medicine

## 2016-08-19 LAB — CMP14+EGFR
A/G RATIO: 2 (ref 1.2–2.2)
ALK PHOS: 86 IU/L (ref 39–117)
ALT: 36 IU/L (ref 0–44)
AST: 28 IU/L (ref 0–40)
Albumin: 4.9 g/dL (ref 3.5–5.5)
BUN / CREAT RATIO: 14 (ref 9–20)
BUN: 10 mg/dL (ref 6–24)
Bilirubin Total: 0.5 mg/dL (ref 0.0–1.2)
CO2: 28 mmol/L (ref 18–29)
Calcium: 9.3 mg/dL (ref 8.7–10.2)
Chloride: 97 mmol/L (ref 96–106)
Creatinine, Ser: 0.72 mg/dL — ABNORMAL LOW (ref 0.76–1.27)
GFR calc Af Amer: 118 mL/min/{1.73_m2} (ref >59)
GFR calc non Af Amer: 102 mL/min/{1.73_m2} (ref >59)
GLOBULIN, TOTAL: 2.4 g/dL (ref 1.5–4.5)
Glucose: 90 mg/dL (ref 65–99)
POTASSIUM: 4.8 mmol/L (ref 3.5–5.2)
SODIUM: 141 mmol/L (ref 134–144)
Total Protein: 7.3 g/dL (ref 6.0–8.5)

## 2016-08-19 LAB — CBC WITH DIFFERENTIAL/PLATELET
BASOS: 1 %
Basophils Absolute: 0.1 10*3/uL (ref 0.0–0.2)
EOS (ABSOLUTE): 0.1 10*3/uL (ref 0.0–0.4)
EOS: 2 %
HEMATOCRIT: 46 % (ref 37.5–51.0)
Hemoglobin: 16.3 g/dL (ref 13.0–17.7)
Immature Grans (Abs): 0 10*3/uL (ref 0.0–0.1)
Immature Granulocytes: 0 %
LYMPHS ABS: 3.4 10*3/uL — AB (ref 0.7–3.1)
Lymphs: 39 %
MCH: 33.6 pg — ABNORMAL HIGH (ref 26.6–33.0)
MCHC: 35.4 g/dL (ref 31.5–35.7)
MCV: 95 fL (ref 79–97)
MONOS ABS: 0.6 10*3/uL (ref 0.1–0.9)
Monocytes: 7 %
NEUTROS ABS: 4.5 10*3/uL (ref 1.4–7.0)
Neutrophils: 51 %
Platelets: 230 10*3/uL (ref 150–379)
RBC: 4.85 x10E6/uL (ref 4.14–5.80)
RDW: 13.2 % (ref 12.3–15.4)
WBC: 8.7 10*3/uL (ref 3.4–10.8)

## 2016-08-19 LAB — LIPID PANEL
CHOL/HDL RATIO: 2.9 ratio (ref 0.0–5.0)
Cholesterol, Total: 138 mg/dL (ref 100–199)
HDL: 48 mg/dL (ref >39)
LDL CALC: 46 mg/dL (ref 0–99)
TRIGLYCERIDES: 219 mg/dL — AB (ref 0–149)
VLDL CHOLESTEROL CAL: 44 mg/dL — AB (ref 5–40)

## 2016-08-19 LAB — PSA: Prostate Specific Ag, Serum: 0.3 ng/mL (ref 0.0–4.0)

## 2016-08-20 NOTE — Telephone Encounter (Signed)
Patient aware of lab results.

## 2016-08-26 ENCOUNTER — Telehealth: Payer: Self-pay | Admitting: Family Medicine

## 2016-08-26 MED ORDER — ROSUVASTATIN CALCIUM 10 MG PO TABS: 10.0000 mg | ORAL_TABLET | Freq: Every day | ORAL | 5 refills | Status: DC

## 2016-08-26 NOTE — Telephone Encounter (Signed)
RX for crestor 10 mg sent. Would recommend f/u 1-2 month for labs and check to see if he is tolerating.   Laroy Apple, MD [facility] 08/26/2016, 12:29 PM

## 2016-08-26 NOTE — Telephone Encounter (Signed)
LMTCB

## 2016-08-26 NOTE — Telephone Encounter (Signed)
Please review and advise.

## 2016-09-02 ENCOUNTER — Ambulatory Visit (HOSPITAL_COMMUNITY)
Admission: RE | Admit: 2016-09-02 | Discharge: 2016-09-02 | Disposition: A | Discharge status: Home / Self Care | Payer: 59 | Source: Ambulatory Visit | Attending: Family Medicine | Admitting: Family Medicine

## 2016-09-02 DIAGNOSIS — R911 Solitary pulmonary nodule: Secondary | ICD-10-CM

## 2016-09-04 NOTE — Telephone Encounter (Signed)
Patient aware of recommendation.  

## 2016-10-16 ENCOUNTER — Other Ambulatory Visit: Payer: Self-pay | Admitting: Family Medicine

## 2017-05-11 ENCOUNTER — Ambulatory Visit (INDEPENDENT_AMBULATORY_CARE_PROVIDER_SITE_OTHER): Payer: 59

## 2017-05-11 ENCOUNTER — Encounter: Payer: Self-pay | Admitting: Family Medicine

## 2017-05-11 ENCOUNTER — Ambulatory Visit (INDEPENDENT_AMBULATORY_CARE_PROVIDER_SITE_OTHER): Payer: 59 | Admitting: Family Medicine

## 2017-05-11 VITALS — BP 154/89 | HR 87 | Temp 97.9°F | Ht 74.2 in | Wt 213.4 lb

## 2017-05-11 DIAGNOSIS — M25551 Pain in right hip: Secondary | ICD-10-CM

## 2017-05-11 MED ORDER — HYDROCODONE-ACETAMINOPHEN 5-325 MG PO TABS: 1.0000 | ORAL_TABLET | Freq: Four times a day (QID) | ORAL | 0 refills | Status: DC | PRN

## 2017-05-11 MED ORDER — METHYLPREDNISOLONE ACETATE 80 MG/ML IJ SUSP
80.0000 mg | Freq: Once | INTRAMUSCULAR | Status: AC
  Administered 2017-05-11: 80 mg via INTRAMUSCULAR

## 2017-05-11 NOTE — Progress Notes (Signed)
   HPI  Patient presents today here with right hip pain.  Patient states that he went on vacation and his hip pain got better, he did not have any problems for a few months. About 2 weeks ago he developed right-sided lateral hip pain with radiation down to his right knee. 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  He denies any discrete injury. He's tried warm moist heat with good improvement. He is also trying meloxicam with some improvement.  He was previously using intermittent hydrocodone for pain which he has run out of. His last prescription was December 2017.  PMH: Smoking status noted ROS: Per HPI  Objective: BP (!) 154/89   Pulse 87   Temp 97.9 F (36.6 C) (Oral)   Ht 6' 2.2" (1.885 m)   Wt 213 lb 6.4 oz (96.8 kg)   BMI 27.25 kg/m  Gen: NAD, alert, cooperative with exam HEENT: NCAT CV: RRR, good S1/S2, no murmur Resp: CTABL, no wheezes, non-labored Ext: No edema, warm Neuro: Alert and oriented, No gross deficits  MSK:  Negative Faber test, lateral hip pain with fadir Mild TTP over R greater troch No TTP of BL paraspinal muscles or midlin in lumbar area  Assessment and plan:  # Lateral right hip pain Unclear etiology, could be only greater trochanteric bursitis, however he has mild tenderness here and more debilitating symptoms than average. I am Depo-Medrol given Refill hydrocodone Plain film of the right hip, although I doubt intra-articular issues. Refer to sports medicine, I appreciate their opinion    Orders Placed This Encounter  Procedures  . DG HIP UNILAT W OR W/O PELVIS 2-3 VIEWS RIGHT    Standing Status:   Future    Standing Expiration Date:   07/10/2018    Order Specific Question:   Reason for Exam (SYMPTOM  OR DIAGNOSIS REQUIRED)    Answer:   R lat hip pain    Order Specific Question:   Preferred imaging location?    Answer:   Internal  . Ambulatory referral to Sports  Medicine    Referral Priority:   Routine    Referral Type:   Consultation    Referred to Provider:   Lyndal Pulley, DO    Number of Visits Requested:   1    Meds ordered this encounter  Medications  . methylPREDNISolone acetate (DEPO-MEDROL) injection 80 mg  . HYDROcodone-acetaminophen (NORCO) 5-325 MG tablet    Sig: Take 1 tablet by mouth every 6 (six) hours as needed.    Dispense:  60 tablet    Refill:  0    Laroy Apple, MD [facility] 05/11/2017, 4:07 PM

## 2017-05-12 NOTE — Progress Notes (Signed)
Patient aware.

## 2017-06-01 NOTE — Progress Notes (Signed)
[name] [address] [place], [place] [address] Phone: [phone] Subjective:    I'm seeing this patient by the request  of:  Timmothy Euler, MD   CC: Right hip pain.  [id number]  Louis [name] is a 61 y.o. male coming in with complaint of right hip pain. Patient's on primary care provider. States that it's been going on for multiple months. Some radiation going down the right knee. Patient is a little manual labor and notices more discomfort with certain range of motion. Does not remember any true injury. 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 Left side of his back hurt and radiates down leg while right hip hurts. Did receive a steroid injection in glute from primary care provider that really helped.   Onset- chronic Location- right hip Duration- intermittent Character- dull Aggravating factors-  Reliving factors-  Therapies tried- heat, IBU Severity-6 out of 10 and worsening   patient did have x-rays taken 05/12/2017. Independently visualized by me. Patient does have some narrowing of the right hip noted. Patient also has A Calcification Lateral to the Roof of the Acetabulum.  Past Medical History:  Diagnosis Date  . DDD (degenerative disc disease), lumbar   . Elevated white blood cell count   . Hyperlipidemia   . Hypertension    Past Surgical History:  Procedure Laterality Date  . TONSILLECTOMY     Social History   Social History  . Marital status: Married    Spouse name: N/A  . Number of children: N/A  . Years of education: N/A   Social History Main Topics  . Smoking status: Current Some Day Smoker    Packs/day: 0.50    Years: 30.00    Types: Cigarettes    Start date: 08/17/1984  . Smokeless tobacco: Current User    Types: Chew  . Alcohol use 8.4 oz/week    14 Cans of beer per week  . Drug use: No  . Sexual activity: Not Asked   Other Topics Concern  . None   Social History Narrative  . None    Allergies  Allergen Reactions  . Penicillins Other (See Comments)    Occurred as child not sure what type of reaction he had   Family History  Problem Relation Age of Onset  . Cancer Mother        breast     Past medical history, social, surgical and family history all reviewed in electronic medical record.  No pertanent information unless stated regarding to the chief complaint.   Review of Systems:Review of systems updated and as accurate as of 06/02/17  No headache, visual changes, nausea, vomiting, diarrhea, constipation, dizziness, abdominal pain, skin rash, fevers, chills, night sweats, weight loss, swollen lymph nodes, body aches, joint swelling, chest pain, shortness of breath, mood changes. Positive muscle aches, positive fatigue  Objective  Pulse 74, weight 209 lb (94.8 kg), SpO2 96 %. Systems examined below as of 06/02/17   General: No apparent distress alert and oriented x3 mood and affect normal, dressed appropriately.  HEENT: Pupils equal, extraocular movements intact  Respiratory: Patient's speak in full sentences and does not appear short of breath  Cardiovascular: No lower extremity edema, non tender, no erythema  Skin: Warm dry intact with no signs of infection or rash on extremities or on axial skeleton.  Abdomen: Soft nontender  Neuro: Cranial nerves II through XII are intact, neurovascularly intact in all extremities with 2+ DTRs  and 2+ pulses.  Lymph: No lymphadenopathy of posterior or anterior cervical chain or axillae bilaterally.  Gait normal with good balance and coordination.  MSK:  Non tender with full range of motion and good stability and symmetric strength and tone of shoulders, elbows, wrist,  knee and ankles bilaterally.  FGH:WEXHB ROM IR: 95 Deg with worsening pain, ER: 25 Deg with patient and worsening pain over the lateral aspect of the, Flexion: 120 Deg, Extension: 80 Deg, Abduction: 35 Deg, Adduction: 25 Deg Strength Mild weakness with  forward 5 strength compared to contralateral sign. Pelvic alignment unremarkable to inspection and palpation. Standing hip rotation and gait without trendelenburg sign / unsteadiness. Greater trochanter without tenderness to palpation. No tenderness over piriformis and greater trochanter.  pain with FABER and FADIR.  Back exam shows the patient does have some mild degenerative scoliosis. Patient does have some tenderness to palpation in the paraspinal musculature especially L5-S1 on the right side. Negative straight leg test but pockets have significant tightness of the hamstrings bilaterally. Neurovascular intact distally.  Procedure 71696; 15 additional minutes spent for Therapeutic exercises as stated in above notes.  This included exercises focusing on stretching, strengthening, with significant focus on eccentric aspects.   Long term goals include an improvement in range of motion, strength, endurance as well as avoiding reinjury. Patient's frequency would include in 1-2 times a day, 3-5 times a week for a duration of 6-12 weeks. Low back exercises that included:  Pelvic tilt/bracing instruction to focus on control of the pelvic girdle and lower abdominal muscles  Glute strengthening exercises, focusing on proper firing of the glutes without engaging the low back muscles Proper stretching techniques for maximum relief for the hamstrings, hip flexors, low back and some rotation where tolerated   Proper technique shown and discussed handout in great detail with ATC.  All questions were discussed and answered.     Impression and Recommendations:     This case required medical decision making of moderate complexity.      Note: This dictation was prepared with Dragon dictation along with smaller phrase technology. Any transcriptional errors that result from this process are unintentional.

## 2017-06-02 ENCOUNTER — Other Ambulatory Visit (INDEPENDENT_AMBULATORY_CARE_PROVIDER_SITE_OTHER): Payer: 59

## 2017-06-02 ENCOUNTER — Encounter: Payer: Self-pay | Admitting: Family Medicine

## 2017-06-02 ENCOUNTER — Ambulatory Visit (INDEPENDENT_AMBULATORY_CARE_PROVIDER_SITE_OTHER): Payer: 59 | Admitting: Family Medicine

## 2017-06-02 VITALS — HR 74 | Wt 209.0 lb

## 2017-06-02 DIAGNOSIS — M1611 Unilateral primary osteoarthritis, right hip: Secondary | ICD-10-CM | POA: Insufficient documentation

## 2017-06-02 DIAGNOSIS — M5136 Other intervertebral disc degeneration, lumbar region: Secondary | ICD-10-CM

## 2017-06-02 DIAGNOSIS — M51369 Other intervertebral disc degeneration, lumbar region without mention of lumbar back pain or lower extremity pain: Secondary | ICD-10-CM | POA: Insufficient documentation

## 2017-06-02 DIAGNOSIS — M2550 Pain in unspecified joint: Secondary | ICD-10-CM

## 2017-06-02 LAB — SEDIMENTATION RATE: Sed Rate: 3 mm/hr (ref 0–20)

## 2017-06-02 LAB — IBC PANEL
Iron: 270 ug/dL — ABNORMAL HIGH (ref 42–165)
Saturation Ratios: 62.2 % — ABNORMAL HIGH (ref 20.0–50.0)
TRANSFERRIN: 310 mg/dL (ref 212.0–360.0)

## 2017-06-02 LAB — TSH: TSH: 0.24 u[IU]/mL — ABNORMAL LOW (ref 0.35–4.50)

## 2017-06-02 LAB — VITAMIN D 25 HYDROXY (VIT D DEFICIENCY, FRACTURES): VITD: 22.2 ng/mL — ABNORMAL LOW (ref 30.00–100.00)

## 2017-06-02 MED ORDER — VITAMIN D (ERGOCALCIFEROL) 1.25 MG (50000 UNIT) PO CAPS: 50000.0000 [IU] | ORAL_CAPSULE | ORAL | 0 refills | Status: DC

## 2017-06-02 NOTE — Patient Instructions (Signed)
Good to see you  Ice 20 minutes 2 times daily. Usually after activity and before bed. Exercises 3 times a week.  Labs downstairs today  You do have calcium in places where it is not supposed to be an I hope we can get some of it out of there Once weekly vitamin D for 12 weeks.  Over the counter get  Turmeric 500mg  twice daily  See me again in 3-5 weeks.

## 2017-06-02 NOTE — Assessment & Plan Note (Signed)
Moderate in nature. Possible concern for more of a spinal stenosis. We discussed icing regimen, home exercises in which activities to do in which ones to avoid. Patient will continue to increase activity as tolerated. Follow-up again with me in 4-8 weeks.

## 2017-06-02 NOTE — Assessment & Plan Note (Signed)
Patient does have hip arthritis. It seems to be moderate to severe. Patient though does have surrounding soft tissue calcification that also is concerning. Discussed with patient at great length, we discussed icing regimen, home exercises, which activities to do in which ones to avoid. Patient pending labs will make some other adjustment. Follow-up again in 4 weeks

## 2017-06-03 LAB — PTH, INTACT AND CALCIUM
Calcium: 9.1 mg/dL (ref 8.6–10.3)
PTH: 23 pg/mL (ref 14–64)

## 2017-06-03 LAB — RHEUMATOID FACTOR

## 2017-06-03 LAB — CALCIUM, IONIZED: Calcium, Ion: 4.9 mg/dL (ref 4.8–5.6)

## 2017-06-03 LAB — ANA: Anti Nuclear Antibody(ANA): NEGATIVE

## 2017-06-08 ENCOUNTER — Telehealth: Payer: Self-pay

## 2017-06-08 NOTE — Progress Notes (Signed)
Called [place] at 3:02 pm to talk to him about his lab results. Patient did not answer. Left a message for him to call us back.

## 2017-06-08 NOTE — Telephone Encounter (Signed)
Patient called back and discussed results with Mendel Ryder.

## 2017-06-08 NOTE — Telephone Encounter (Signed)
Called Patient at 3:02 to discuss his lab results. Patient did not answer. Left a message for him to call us back on our main line.

## 2017-06-26 ENCOUNTER — Encounter: Payer: Self-pay | Admitting: Family Medicine

## 2017-06-26 ENCOUNTER — Ambulatory Visit (INDEPENDENT_AMBULATORY_CARE_PROVIDER_SITE_OTHER): Payer: 59 | Admitting: Family Medicine

## 2017-06-26 ENCOUNTER — Ambulatory Visit: Payer: Self-pay

## 2017-06-26 VITALS — BP 140/82 | HR 78 | Wt 209.0 lb

## 2017-06-26 DIAGNOSIS — M7062 Trochanteric bursitis, left hip: Secondary | ICD-10-CM | POA: Diagnosis not present

## 2017-06-26 DIAGNOSIS — M7061 Trochanteric bursitis, right hip: Secondary | ICD-10-CM | POA: Diagnosis not present

## 2017-06-26 DIAGNOSIS — M25552 Pain in left hip: Secondary | ICD-10-CM | POA: Diagnosis not present

## 2017-06-26 MED ORDER — NORTRIPTYLINE HCL 10 MG PO CAPS: 10.0000 mg | ORAL_CAPSULE | Freq: Every day | ORAL | 1 refills | Status: DC

## 2017-06-26 NOTE — Assessment & Plan Note (Signed)
Bilateral injections given today with patient's pain being severely more on the lateral aspects. We discussed again about the abnormal calcium deposits. We discussed with patient at great length. Patient's also has severe arthritis of the right hip and may need a replacement sooner than later. Patient's back also has severe amount of arthritis and could be contributing to some of the discomfort and pain.

## 2017-06-26 NOTE — Patient Instructions (Signed)
Good to see you  I hope the injections help We may try a hip injection at follow up  Continue the vitamin D Try nortriptyline at night See me again in 4 weeks.

## 2017-06-26 NOTE — Progress Notes (Signed)
[name] [address], [place] [address] Phone: [phone] Subjective:    I'm seeing this patient by the request  of:    CC: Back follow-up  [id number]  Louis [name] is a 61 y.o. male coming in with left hip pain after helping someone move a trailer on Saturday. He has an antalgic gait due to the pain which is constantly there.Nothing has helped his pain. Patient was found to have significant amount of calcific changes on the lateral aspect the hands bilaterally. Found to have low vitamin D levels and is on once weekly vitamin D. We discussed with patient also about the arthritic changes of the hip. Patient states still the pain seems to be on the lateral aspect of the hips right greater left than is causing more pain at night. States that it is chronic as well.      Past Medical History:  Diagnosis Date  . DDD (degenerative disc disease), lumbar   . Elevated white blood cell count   . Hyperlipidemia   . Hypertension    Past Surgical History:  Procedure Laterality Date  . TONSILLECTOMY     Social History   Social History  . Marital status: Married    Spouse name: N/A  . Number of children: N/A  . Years of education: N/A   Social History Main Topics  . Smoking status: Current Some Day Smoker    Packs/day: 0.50    Years: 30.00    Types: Cigarettes    Start date: 08/17/1984  . Smokeless tobacco: Current User    Types: Chew  . Alcohol use 8.4 oz/week    14 Cans of beer per week  . Drug use: No  . Sexual activity: Not Asked   Other Topics Concern  . None   Social History Narrative  . None   Allergies  Allergen Reactions  . Penicillins Other (See Comments)    Occurred as child not sure what type of reaction he had   Family History  Problem Relation Age of Onset  . Cancer Mother        breast     Past medical history, social, surgical and family history all reviewed in electronic medical record.  No pertanent  information unless stated regarding to the chief complaint.   Review of Systems:Review of systems updated and as accurate as of 06/26/17  No headache, visual changes, nausea, vomiting, diarrhea, constipation, dizziness, abdominal pain, skin rash, fevers, chills, night sweats, weight loss, swollen lymph nodes,chest pain, shortness of breath, mood changes. Positive muscle aches, body aches  Objective  Blood pressure 140/82, pulse 78, weight 209 lb (94.8 kg), SpO2 98 %. Systems examined below as of 06/26/17   General: No apparent distress alert and oriented x3 mood and affect normal, dressed appropriately.  HEENT: Pupils equal, extraocular movements intact  Respiratory: Patient's speak in full sentences and does not appear short of breath  Cardiovascular: No lower extremity edema, non tender, no erythema  Skin: Warm dry intact with no signs of infection or rash on extremities or on axial skeleton.  Abdomen: Soft nontender  Neuro: Cranial nerves II through XII are intact, neurovascularly intact in all extremities with 2+ DTRs and 2+ pulses.  Lymph: No lymphadenopathy of posterior or anterior cervical chain or axillae bilaterally.  Gait Antalgic gait MSK:  Non tender with full range of motion and good stability and symmetric strength and tone of shoulders, elbows, wrist,, knee and ankles bilaterally.  Bilateral  hip exam shows the patient's right hip has no internal range of motion. Patient does have significant decrease in neck range of motion bilaterally. Severe pain over the lateral aspects of the hip bilaterally. Back exam shows significant tightness of the hamstrings but negative straight leg test. Patient's back is tender to palpation diffusely of the lumbar spine.   Procedure: Real-time Ultrasound Guided Injection of right greater trochanteric bursitis secondary to patient's body habitus Device: GE Logiq Q7 Ultrasound guided injection is preferred based studies that show increased duration,  increased effect, greater accuracy, decreased procedural pain, increased response rate, and decreased cost with ultrasound guided versus blind injection.  Verbal informed consent obtained.  Time-out conducted.  Noted no overlying erythema, induration, or other signs of local infection.  Skin prepped in a sterile fashion.  Local anesthesia: Topical Ethyl chloride.  With sterile technique and under real time ultrasound guidance:  Greater trochanteric area was visualized and patient's bursa was noted. A 22-gauge 3 inch needle was inserted and 4 cc of 0.5% Marcaine and 1 cc of Kenalog 40 mg/dL was injected. Pictures taken Completed without difficulty  Pain immediately resolved suggesting accurate placement of the medication.  Advised to call if fevers/chills, erythema, induration, drainage, or persistent bleeding.  Images permanently stored and available for review in the ultrasound unit.  Impression: Technically successful ultrasound guided injection.   Procedure: Real-time Ultrasound Guided Injection of left  greater trochanteric bursitis secondary to patient's body habitus Device: GE Logiq Q7  Ultrasound guided injection is preferred based studies that show increased duration, increased effect, greater accuracy, decreased procedural pain, increased response rate, and decreased cost with ultrasound guided versus blind injection.  Verbal informed consent obtained.  Time-out conducted.  Noted no overlying erythema, induration, or other signs of local infection.  Skin prepped in a sterile fashion.  Local anesthesia: Topical Ethyl chloride.  With sterile technique and under real time ultrasound guidance:  Greater trochanteric area was visualized and patient's bursa was noted. A 22-gauge 3 inch needle was inserted and 4 cc of 0.5% Marcaine and 1 cc of Kenalog 40 mg/dL was injected. Pictures taken Completed without difficulty  Pain immediately resolved suggesting accurate placement of the medication.   Advised to call if fevers/chills, erythema, induration, drainage, or persistent bleeding.  Images permanently stored and available for review in the ultrasound unit.  Impression: Technically successful ultrasound guided injection.    Impression and Recommendations:     This case required medical decision making of moderate complexity.      Note: This dictation was prepared with Dragon dictation along with smaller phrase technology. Any transcriptional errors that result from this process are unintentional.

## 2017-07-24 ENCOUNTER — Ambulatory Visit: Payer: 59 | Admitting: Family Medicine

## 2017-07-24 ENCOUNTER — Ambulatory Visit (INDEPENDENT_AMBULATORY_CARE_PROVIDER_SITE_OTHER): Payer: 59 | Admitting: Family Medicine

## 2017-07-24 ENCOUNTER — Encounter: Payer: Self-pay | Admitting: Family Medicine

## 2017-07-24 VITALS — BP 158/69 | HR 60 | Temp 96.9°F | Ht 73.0 in | Wt 210.2 lb

## 2017-07-24 DIAGNOSIS — Z23 Encounter for immunization: Secondary | ICD-10-CM

## 2017-07-24 DIAGNOSIS — E785 Hyperlipidemia, unspecified: Secondary | ICD-10-CM

## 2017-07-24 DIAGNOSIS — M7061 Trochanteric bursitis, right hip: Secondary | ICD-10-CM

## 2017-07-24 DIAGNOSIS — M25551 Pain in right hip: Secondary | ICD-10-CM

## 2017-07-24 DIAGNOSIS — R7989 Other specified abnormal findings of blood chemistry: Secondary | ICD-10-CM

## 2017-07-24 DIAGNOSIS — I10 Essential (primary) hypertension: Secondary | ICD-10-CM | POA: Diagnosis not present

## 2017-07-24 DIAGNOSIS — Z0000 Encounter for general adult medical examination without abnormal findings: Secondary | ICD-10-CM

## 2017-07-24 DIAGNOSIS — M7062 Trochanteric bursitis, left hip: Secondary | ICD-10-CM

## 2017-07-24 MED ORDER — HYDROCODONE-ACETAMINOPHEN 5-325 MG PO TABS: 1.0000 | ORAL_TABLET | Freq: Four times a day (QID) | ORAL | 0 refills | Status: DC | PRN

## 2017-07-24 MED ORDER — LISINOPRIL 40 MG PO TABS: 40.0000 mg | ORAL_TABLET | Freq: Every day | ORAL | 3 refills | Status: DC

## 2017-07-24 NOTE — Progress Notes (Signed)
   HPI  Patient presents today for annual physical exam and follow-up chronic medical conditions.  He has had persistent hip pain and would like a refill of hydrocodone, he states he has been taking this 3 or 4 times a week.    Thyroid function was recently found to be slightly low, he will repeat these labs today. No overt symptoms of hyperthyroidism.  Hypertension Good medication compliance in general, however patient has been out of medication now for over a week.  He denies any headaches or chest pain. He states this is because he accidentally threw away his medications, his pharmacy supplied 10 days worth which was not enough  Hyperlipidemia Good medication compliance, tolerating well.  PMH: Smoking status noted ROS: Per HPI  Objective: BP (!) 158/69   Pulse 60   Temp (!) 96.9 F (36.1 C) (Oral)   Ht _0  (1.854 m)   Wt 210 lb 3.2 oz (95.3 kg)   BMI 27.73 kg/m  Gen: NAD, alert, cooperative with exam HEENT: NCAT, EOMI, PERRL CV: RRR, good S1/S2, no murmur Resp: CTABL, no wheezes, non-labored Abd: SNTND, BS present, no guarding or organomegaly Ext: No edema, warm Neuro: Alert and oriented, No gross deficits  Assessment and plan:  #Annual physical exam Normal exam, slightly overweight. Referral for repeat colonoscopy Hepatitis C screening Labs today, fasting  #Hyperlipidemia Repeat labs, continue statin. Tolerating well  #Hip pain Refill hydrocodone, discussed if persistent need will need to place on pain contract, continue to monitor for now. [place] controlled substance database reviewed with no red flags  #Low TSH level Patient with previously low TSH, asymptomatic Repeat labs  Influenza vaccine given today, counseling provided  Hypertension Refill lisinopril, discussed options for running out of medications if this happens again.  Discussed this is a very cheap catch pain medication if needed. Refill meds     Orders Placed This Encounter   Procedures  . Flu Vaccine QUAD 36+ mos IM  . Thyroid Panel With TSH  . CMP14+EGFR  . PSA  . CBC with Differential/Platelet  . Hepatitis C antibody  . Lipid panel  . Ambulatory referral to Gastroenterology    Referral Priority:   Routine    Referral Type:   Consultation    Number of Visits Requested:   1    Meds ordered this encounter  Medications  . lisinopril (PRINIVIL,ZESTRIL) 40 MG tablet    Sig: Take 1 tablet (40 mg total) daily by mouth.    Dispense:  90 tablet    Refill:  3  . HYDROcodone-acetaminophen (NORCO) 5-325 MG tablet    Sig: Take 1 tablet every 6 (six) hours as needed by mouth.    Dispense:  60 tablet    Refill:  0    Laroy Apple, MD [facility] 07/24/2017, 3:51 PM

## 2017-07-24 NOTE — Assessment & Plan Note (Signed)
Improved after the injections.  I do know the patient has severe arthritis of the hips as well as some of the back.  Possible workup needed if any worsening symptoms.  Hopefully patient will do well with conservative therapy and follow-up with me again more on an as-needed basis.

## 2017-07-24 NOTE — Patient Instructions (Signed)
Good to see you  Louis [name] is your friend.  Stay active Try to do the exercises 1-2 times a week  Continue the vitamins See me again in 6 weeks if still in pain

## 2017-07-24 NOTE — Progress Notes (Signed)
[name] [address], [place] [address] Phone: [phone] Subjective:    I'm seeing this patient by the request  of:    CC: Hip pain follow-up  [id number]  Louis [name] is a 61 y.o. male coming in with complaint of hip pain.  Found to have arthritis as well as CPPD.  Was having findings it was consistent with more of a greater trochanteric bursitis bilaterally.  Concern for some of the lumbar radiculopathy.  Patient does not take the medication regularly but feels that the injections were given him previously 1 month ago was significantly improved.  Patient states that he is feeling 90% better.     Past Medical History:  Diagnosis Date  . DDD (degenerative disc disease), lumbar   . Elevated white blood cell count   . Hyperlipidemia   . Hypertension    Past Surgical History:  Procedure Laterality Date  . TONSILLECTOMY     Social History   Socioeconomic History  . Marital status: Married    Spouse name: None  . Number of children: None  . Years of education: None  . Highest education level: None  Social Needs  . Financial resource strain: None  . Food insecurity - worry: None  . Food insecurity - inability: None  . Transportation needs - medical: None  . Transportation needs - non-medical: None  Occupational History  . None  Tobacco Use  . Smoking status: Current Some Day Smoker    Packs/day: 0.50    Years: 30.00    Pack years: 15.00    Types: Cigarettes    Start date: 08/17/1984  . Smokeless tobacco: Current User    Types: Chew  Substance and Sexual Activity  . Alcohol use: Yes    Alcohol/week: 8.4 oz    Types: 14 Cans of beer per week  . Drug use: No  . Sexual activity: None  Other Topics Concern  . None  Social History Narrative  . None   Allergies  Allergen Reactions  . Penicillins Other (See Comments)    Occurred as child not sure what type of reaction he had   Family History  Problem Relation Age of  Onset  . Cancer Mother        breast     Past medical history, social, surgical and family history all reviewed in electronic medical record.  No pertanent information unless stated regarding to the chief complaint.   Review of Systems:Review of systems updated and as accurate as of 07/24/17  No headache, visual changes, nausea, vomiting, diarrhea, constipation, dizziness, abdominal pain, skin rash, fevers, chills, night sweats, weight loss, swollen lymph nodes, body aches, joint swelling,  chest pain, shortness of breath, mood changes.  Positive muscle aches still  Objective  Blood pressure (!) 160/90, pulse 61, height 6' (1.829 m), weight 209 lb (94.8 kg), SpO2 96 %. Systems examined below as of 07/24/17   General: No apparent distress alert and oriented x3 mood and affect normal, dressed appropriately.  HEENT: Pupils equal, extraocular movements intact  Respiratory: Patient's speak in full sentences and does not appear short of breath  Cardiovascular: No lower extremity edema, non tender, no erythema  Skin: Warm dry intact with no signs of infection or rash on extremities or on axial skeleton.  Abdomen: Soft nontender  Neuro: Cranial nerves II through XII are intact, neurovascularly intact in all extremities with 2+ DTRs and 2+ pulses.  Lymph: No lymphadenopathy of posterior or anterior  cervical chain or axillae bilaterally.  Gait normal with good balance and coordination.  MSK:  Non tender with full range of motion and good stability and symmetric strength and tone of shoulders, elbows, wrist, hip, knee and ankles bilaterally.  Back exam is still significant tightness in all planes.  Positive Faber bilaterally.  Tender to palpation over the lateral aspect.  Still having some mild limitation with internal range of motion.  Significant tightness of straight leg test but negative radicular symptoms.   Impression and Recommendations:     This case required medical decision making of  moderate complexity.      Note: This dictation was prepared with Dragon dictation along with smaller phrase technology. Any transcriptional errors that result from this process are unintentional.

## 2017-07-24 NOTE — Patient Instructions (Signed)
Great to see you!  Come back in 6 months unless you need us sooner.    

## 2017-07-25 LAB — CMP14+EGFR
ALBUMIN: 4.7 g/dL (ref 3.6–4.8)
ALT: 39 IU/L (ref 0–44)
AST: 27 IU/L (ref 0–40)
Albumin/Globulin Ratio: 2 (ref 1.2–2.2)
Alkaline Phosphatase: 79 IU/L (ref 39–117)
BUN / CREAT RATIO: 12 (ref 10–24)
BUN: 9 mg/dL (ref 8–27)
Bilirubin Total: 0.5 mg/dL (ref 0.0–1.2)
CALCIUM: 9.4 mg/dL (ref 8.6–10.2)
CHLORIDE: 100 mmol/L (ref 96–106)
CO2: 27 mmol/L (ref 20–29)
CREATININE: 0.77 mg/dL (ref 0.76–1.27)
GFR calc non Af Amer: 99 mL/min/{1.73_m2} (ref >59)
GFR, EST AFRICAN AMERICAN: 114 mL/min/{1.73_m2} (ref >59)
GLUCOSE: 89 mg/dL (ref 65–99)
Globulin, Total: 2.3 g/dL (ref 1.5–4.5)
Potassium: 4.1 mmol/L (ref 3.5–5.2)
Sodium: 144 mmol/L (ref 134–144)
TOTAL PROTEIN: 7 g/dL (ref 6.0–8.5)

## 2017-07-25 LAB — LIPID PANEL
Chol/HDL Ratio: 3.4 ratio (ref 0.0–5.0)
Cholesterol, Total: 206 mg/dL — ABNORMAL HIGH (ref 100–199)
HDL: 61 mg/dL (ref >39)
LDL Calculated: 105 mg/dL — ABNORMAL HIGH (ref 0–99)
TRIGLYCERIDES: 200 mg/dL — AB (ref 0–149)
VLDL CHOLESTEROL CAL: 40 mg/dL (ref 5–40)

## 2017-07-25 LAB — CBC WITH DIFFERENTIAL/PLATELET
BASOS ABS: 0 10*3/uL (ref 0.0–0.2)
BASOS: 0 %
EOS (ABSOLUTE): 0.2 10*3/uL (ref 0.0–0.4)
Eos: 1 %
HEMOGLOBIN: 15.4 g/dL (ref 13.0–17.7)
Hematocrit: 46.2 % (ref 37.5–51.0)
IMMATURE GRANS (ABS): 0 10*3/uL (ref 0.0–0.1)
IMMATURE GRANULOCYTES: 0 %
LYMPHS: 39 %
Lymphocytes Absolute: 4 10*3/uL — ABNORMAL HIGH (ref 0.7–3.1)
MCH: 32.8 pg (ref 26.6–33.0)
MCHC: 33.3 g/dL (ref 31.5–35.7)
MCV: 98 fL — ABNORMAL HIGH (ref 79–97)
MONOCYTES: 7 %
Monocytes Absolute: 0.7 10*3/uL (ref 0.1–0.9)
NEUTROS ABS: 5.4 10*3/uL (ref 1.4–7.0)
NEUTROS PCT: 53 %
PLATELETS: 265 10*3/uL (ref 150–379)
RBC: 4.7 x10E6/uL (ref 4.14–5.80)
RDW: 13.5 % (ref 12.3–15.4)
WBC: 10.4 10*3/uL (ref 3.4–10.8)

## 2017-07-25 LAB — HEPATITIS C ANTIBODY

## 2017-07-25 LAB — PSA: PROSTATE SPECIFIC AG, SERUM: 0.4 ng/mL (ref 0.0–4.0)

## 2017-07-25 LAB — THYROID PANEL WITH TSH
FREE THYROXINE INDEX: 1.8 (ref 1.2–4.9)
T3 Uptake Ratio: 25 % (ref 24–39)
T4, Total: 7.2 ug/dL (ref 4.5–12.0)
TSH: 0.812 u[IU]/mL (ref 0.450–4.500)

## 2017-07-28 ENCOUNTER — Telehealth: Payer: Self-pay | Admitting: Family Medicine

## 2017-07-28 NOTE — Telephone Encounter (Signed)
lmtcb

## 2017-07-28 NOTE — Telephone Encounter (Signed)
Sending to Dr Wendi Snipes nurse

## 2017-07-30 ENCOUNTER — Telehealth: Payer: Self-pay | Admitting: Family Medicine

## 2017-07-30 NOTE — Telephone Encounter (Signed)
Refer to lab note °

## 2017-07-31 ENCOUNTER — Other Ambulatory Visit: Payer: Self-pay | Admitting: Family Medicine

## 2017-07-31 MED ORDER — PRAVASTATIN SODIUM 20 MG PO TABS: 20.0000 mg | ORAL_TABLET | Freq: Every day | ORAL | 3 refills | Status: DC

## 2017-08-03 NOTE — Telephone Encounter (Signed)
Routed to Nurse [facility]

## 2017-08-03 NOTE — Telephone Encounter (Signed)
Patient aware of lab results and that medication has been sent to pharmacy

## 2017-08-21 ENCOUNTER — Other Ambulatory Visit: Payer: Self-pay | Admitting: Family Medicine

## 2017-08-25 NOTE — Telephone Encounter (Signed)
Refill done.  

## 2017-08-26 ENCOUNTER — Encounter: Payer: Self-pay | Admitting: Family Medicine

## 2017-10-22 ENCOUNTER — Ambulatory Visit: Payer: Self-pay

## 2017-10-22 ENCOUNTER — Encounter: Payer: Self-pay | Admitting: Family Medicine

## 2017-10-22 ENCOUNTER — Ambulatory Visit: Payer: 59 | Admitting: Family Medicine

## 2017-10-22 VITALS — BP 160/80 | HR 72 | Ht 72.0 in | Wt 214.0 lb

## 2017-10-22 DIAGNOSIS — M25552 Pain in left hip: Secondary | ICD-10-CM

## 2017-10-22 DIAGNOSIS — M7062 Trochanteric bursitis, left hip: Secondary | ICD-10-CM | POA: Diagnosis not present

## 2017-10-22 DIAGNOSIS — M25551 Pain in right hip: Secondary | ICD-10-CM

## 2017-10-22 DIAGNOSIS — M7061 Trochanteric bursitis, right hip: Secondary | ICD-10-CM | POA: Diagnosis not present

## 2017-10-22 NOTE — Progress Notes (Signed)
[name] [address] [place], [place] [address] Phone: [phone] Subjective:    I'm seeing this patient by the request  of:    CC: Bilateral hip pain  [id number]  Louis [name] is a 62 y.o. male coming in with complaint of bilateral knee pain.  Found to have greater trochanteric bursitis.  Was seen greater than 3 months ago.  Given injections.  Tolerated the procedure well.  States that he is having more pain on the lateral aspect again.  States that there is is some associated back pain with it.  Patient states that it does still seem to get better.  Patient denies any weakness.  States that it can even wake him up at night.    Past Medical History:  Diagnosis Date  . DDD (degenerative disc disease), lumbar   . Elevated white blood cell count   . Hyperlipidemia   . Hypertension    Past Surgical History:  Procedure Laterality Date  . TONSILLECTOMY     Social History   Socioeconomic History  . Marital status: Married    Spouse name: Not on file  . Number of children: Not on file  . Years of education: Not on file  . Highest education level: Not on file  Social Needs  . Financial resource strain: Not on file  . Food insecurity - worry: Not on file  . Food insecurity - inability: Not on file  . Transportation needs - medical: Not on file  . Transportation needs - non-medical: Not on file  Occupational History  . Not on file  Tobacco Use  . Smoking status: Current Some Day Smoker    Packs/day: 0.50    Years: 30.00    Pack years: 15.00    Types: Cigarettes    Start date: 08/17/1984  . Smokeless tobacco: Current User    Types: Chew  Substance and Sexual Activity  . Alcohol use: Yes    Alcohol/week: 8.4 oz    Types: 14 Cans of beer per week  . Drug use: No  . Sexual activity: Not on file  Other Topics Concern  . Not on file  Social History Narrative  . Not on file   Allergies  Allergen Reactions  . Penicillins Other (See  Comments)    Occurred as child not sure what type of reaction he had   Family History  Problem Relation Age of Onset  . Cancer Mother        breast     Past medical history, social, surgical and family history all reviewed in electronic medical record.  No pertanent information unless stated regarding to the chief complaint.   Review of Systems:Review of systems updated and as accurate as of 10/22/17  No headache, visual changes, nausea, vomiting, diarrhea, constipation, dizziness, abdominal pain, skin rash, fevers, chills, night sweats, weight loss, swollen lymph nodes, body aches, joint swelling, chest pain, shortness of breath, mood changes.  Positive muscle aches  Objective  Blood pressure (!) 160/80, height 6' (1.829 m), weight 214 lb (97.1 kg). Systems examined below as of 10/22/17   General: No apparent distress alert and oriented x3 mood and affect normal, dressed appropriately.  HEENT: Pupils equal, extraocular movements intact  Respiratory: Patient's speak in full sentences and does not appear short of breath  Cardiovascular: No lower extremity edema, non tender, no erythema  Skin: Warm dry intact with no signs of infection or rash on extremities or on axial skeleton.  Abdomen:  Soft nontender  Neuro: Cranial nerves II through XII are intact, neurovascularly intact in all extremities with 2+ DTRs and 2+ pulses.  Lymph: No lymphadenopathy of posterior or anterior cervical chain or axillae bilaterally.  Gait normal with good balance and coordination.  MSK:  Non tender with full range of motion and good stability and symmetric strength and tone of shoulders, elbows, wrist,  knee and ankles bilaterally.   Hip: Bilateral ROM IR: 25 Deg, ER: 45 Deg, Flexion: 120 Deg, Extension: 100 Deg, Abduction: 45 Deg, Adduction: 45 Deg Strength IR: 5/5, ER: 5/5, Flexion: 4/5, Extension: 4/5, Abduction: 4/5, but symmetric adduction: 5/5 Pelvic alignment unremarkable to inspection and  palpation. Standing hip rotation and gait without trendelenburg sign / unsteadiness. Greater trochanteric bursitis bilaterally severely tender.. No SI joint tenderness and normal minimal SI movement. Neck exam shows significant loss of lordosis.  Pain in the paraspinal musculature lumbar spine.  Significant tightness of the femur.   Procedure: Real-time Ultrasound Guided Injection of right greater trochanteric bursitis secondary to patient's body habitus Device: GE Logiq Q7 Ultrasound guided injection is preferred based studies that show increased duration, increased effect, greater accuracy, decreased procedural pain, increased response rate, and decreased cost with ultrasound guided versus blind injection.  Verbal informed consent obtained.  Time-out conducted.  Noted no overlying erythema, induration, or other signs of local infection.  Skin prepped in a sterile fashion.  Local anesthesia: Topical Ethyl chloride.  With sterile technique and under real time ultrasound guidance:  Greater trochanteric area was visualized and patient's bursa was noted. A 22-gauge 3 inch needle was inserted and 4 cc of 0.5% Marcaine and 1 cc of Kenalog 40 mg/dL was injected. Pictures taken Completed without difficulty  Pain immediately resolved suggesting accurate placement of the medication.  Advised to call if fevers/chills, erythema, induration, drainage, or persistent bleeding.  Images permanently stored and available for review in the ultrasound unit.  Impression: Technically successful ultrasound guided injection.   Procedure: Real-time Ultrasound Guided Injection of left  greater trochanteric bursitis secondary to patient's body habitus Device: GE Logiq Q7  Ultrasound guided injection is preferred based studies that show increased duration, increased effect, greater accuracy, decreased procedural pain, increased response rate, and decreased cost with ultrasound guided versus blind injection.  Verbal  informed consent obtained.  Time-out conducted.  Noted no overlying erythema, induration, or other signs of local infection.  Skin prepped in a sterile fashion.  Local anesthesia: Topical Ethyl chloride.  With sterile technique and under real time ultrasound guidance:  Greater trochanteric area was visualized and patient's bursa was noted. A 22-gauge 3 inch needle was inserted and 4 cc of 0.5% Marcaine and 1 cc of Kenalog 40 mg/dL was injected. Pictures taken Completed without difficulty  Pain immediately resolved suggesting accurate placement of the medication.  Advised to call if fevers/chills, erythema, induration, drainage, or persistent bleeding.  Images permanently stored and available for review in the ultrasound unit.  Impression: Technically successful ultrasound guided injection.   Impression and Recommendations:     This case required medical decision making of moderate complexity.      Note: This dictation was prepared with Dragon dictation along with smaller phrase technology. Any transcriptional errors that result from this process are unintentional.

## 2017-10-22 NOTE — Patient Instructions (Signed)
Good to see you  Injected the side of the hips again today  Always a chance we may need to inject the other hips (the joint) or look at the back if this continues As long as it continues to last at least 3 months lets continue with the same regimen Make an appointment in 4 weeks just in case

## 2017-10-22 NOTE — Assessment & Plan Note (Signed)
Bilateral injections given today.  Tolerated the procedure well again.  Had some resolution of pain almost immediately.  We discussed the possibility of the gabapentin again which patient declined.  We could go up on patient's nortriptyline as well.  Patient is going to continue with conservative therapy and follow-up with me again in 4 weeks

## 2017-11-20 ENCOUNTER — Ambulatory Visit: Payer: 59 | Admitting: Family Medicine

## 2018-01-28 ENCOUNTER — Ambulatory Visit: Payer: 59 | Admitting: Family Medicine

## 2018-01-28 ENCOUNTER — Encounter: Payer: Self-pay | Admitting: Family Medicine

## 2018-01-28 VITALS — BP 171/88 | HR 72 | Temp 96.8°F | Ht 72.0 in | Wt 220.0 lb

## 2018-01-28 DIAGNOSIS — I10 Essential (primary) hypertension: Secondary | ICD-10-CM

## 2018-01-28 DIAGNOSIS — M5136 Other intervertebral disc degeneration, lumbar region: Secondary | ICD-10-CM

## 2018-01-28 DIAGNOSIS — R059 Cough, unspecified: Secondary | ICD-10-CM

## 2018-01-28 DIAGNOSIS — R05 Cough: Secondary | ICD-10-CM | POA: Diagnosis not present

## 2018-01-28 MED ORDER — AMLODIPINE BESYLATE 5 MG PO TABS: 5.0000 mg | ORAL_TABLET | Freq: Every day | ORAL | 3 refills | Status: DC

## 2018-01-28 MED ORDER — AZITHROMYCIN 250 MG PO TABS
ORAL_TABLET | ORAL | 0 refills | Status: DC
Start: 2018-01-28 — End: 2018-02-10

## 2018-01-28 MED ORDER — HYDROCODONE-ACETAMINOPHEN 5-325 MG PO TABS: 1.0000 | ORAL_TABLET | Freq: Four times a day (QID) | ORAL | 0 refills | Status: DC | PRN

## 2018-01-28 NOTE — Patient Instructions (Signed)
Great to see you!   

## 2018-01-28 NOTE — Progress Notes (Signed)
   HPI  Patient presents today cough and back pain.  Patient is Wednesday has had cough now for about 2 weeks with nasal congestion. States that he is breathing okay. He is tolerating food and fluids like usual. He seems to be getting worse.  He is a smoker.  Patient also requests refill hydrocodone, states he is using a pill once or twice a week for severe back pain. He states overall he is doing better from a back pain perspective but does like having the medication if needed.  Hypertension 150s at home mostly, no headaches or chest pain. Good medication compliance.  PMH: Smoking status noted ROS: Per HPI  Objective: BP (!) 171/88   Pulse 72   Temp (!) 96.8 F (36 C) (Oral)   Ht 6' (1.829 m)   Wt 220 lb (99.8 kg)   SpO2 97%   BMI 29.84 kg/m  Gen: NAD, alert, cooperative with exam HEENT: NCAT CV: RRR, good S1/S2, no murmur Resp: CTABL, no wheezes, non-labored Ext: No edema, warm Neuro: Alert and oriented, No gross deficits  Assessment and plan:  #Cough 2 weeks of persistent cough, with mild wheezing on exam Azithromycin  #Hypertension Elevated today, continue lisinopril, adding amlodipine Follow-up 3 to 4 weeks  #Degenerative disc disease Patient with persistent chronic lumbar back pain. He is using hydrocodone occasionally, he has had 3 other prescriptions in the last 2 years only from me. Refill [place] controlled substance database reviewed without concern  Meds ordered this encounter  Medications  . azithromycin (ZITHROMAX) 250 MG tablet    Sig: Take 2 tablets on day 1 and 1 tablet daily after that    Dispense:  6 tablet    Refill:  0  . DISCONTD: HYDROcodone-acetaminophen (NORCO) 5-325 MG tablet    Sig: Take 1 tablet by mouth every 6 (six) hours as needed.    Dispense:  60 tablet    Refill:  0  . amLODipine (NORVASC) 5 MG tablet    Sig: Take 1 tablet (5 mg total) by mouth daily.    Dispense:  30 tablet    Refill:  3  . HYDROcodone-acetaminophen  (NORCO) 5-325 MG tablet    Sig: Take 1 tablet by mouth every 6 (six) hours as needed.    Dispense:  60 tablet    Refill:  0    For chronic pain    Laroy Apple, MD [facility] 01/28/2018, 11:23 AM

## 2018-02-01 ENCOUNTER — Telehealth: Payer: Self-pay | Admitting: Family Medicine

## 2018-02-01 MED ORDER — HYDROCHLOROTHIAZIDE 25 MG PO TABS: 25.0000 mg | ORAL_TABLET | Freq: Every day | ORAL | 3 refills | Status: DC

## 2018-02-01 NOTE — Telephone Encounter (Signed)
Pt aware.

## 2018-02-01 NOTE — Telephone Encounter (Signed)
Pt has amlodipine induced leg swelling, switch to hctz.   Laroy Apple, MD [facility] 02/01/2018, 9:56 AM

## 2018-02-01 NOTE — Telephone Encounter (Signed)
Patient states that since starting new BP med this past Thursday his feet are swelling. Please advise

## 2018-02-10 ENCOUNTER — Encounter: Payer: Self-pay | Admitting: Family Medicine

## 2018-02-10 ENCOUNTER — Ambulatory Visit: Payer: 59 | Admitting: Family Medicine

## 2018-02-10 VITALS — BP 94/64 | HR 84 | Temp 97.1°F | Ht 72.0 in | Wt 208.0 lb

## 2018-02-10 DIAGNOSIS — R252 Cramp and spasm: Secondary | ICD-10-CM

## 2018-02-10 NOTE — Progress Notes (Signed)
BP 94/64   Pulse 84   Temp (!) 97.1 F (36.2 C) (Oral)   Ht 6' (1.829 m)   Wt 208 lb (94.3 kg)   BMI 28.21 kg/m    Subjective:    Patient ID: Louis [name], male    DOB: April 12, 1956, 62 y.o.   MRN: [id number]  HPI: Louis [name] is a 62 y.o. male presenting on 02/10/2018 for cramps in hands and ribs   HPI Cramping in hands and ribs Patient has complaints of having cramping in his ribs and hands on both sides that has been coming on intermittently over the past week.  Of note the weather has changed and it has been a lot more hot over the past couple weeks.  He was instructed to stop his amlodipine because of swelling and start hydrochlorothiazide along with his lisinopril.  He did not stop the amlodipine but did start the hydrochlorothiazide and his blood pressure today is on the lower end.  He denies any pain today he did have a good sleep and has not been in the heat at all today.  Patient says he has been trying to stay hydrated well and he does say that the swelling has gone down from what he had previously with the hydrochlorothiazide.  He does also note that he has been urinating more frequently as well.  Relevant past medical, surgical, family and social history reviewed and updated as indicated. Interim medical history since our last visit reviewed. Allergies and medications reviewed and updated.  Review of Systems  Constitutional: Negative for chills and fever.  Eyes: Negative for discharge.  Respiratory: Negative for shortness of breath and wheezing.   Cardiovascular: Negative for chest pain, palpitations and leg swelling.  Gastrointestinal: Negative for abdominal pain.  Musculoskeletal: Negative for back pain and gait problem.  Skin: Negative for rash.  Neurological: Negative for dizziness, weakness, light-headedness and numbness.  All other systems reviewed and are negative.   Per HPI unless specifically indicated above   Allergies as of 02/10/2018      Reactions   Penicillins Other (See Comments)   Occurred as child not sure what type of reaction he had      Medication List        Accurate as of 02/10/18  9:12 AM. Always use your most recent med list.          amLODipine 5 MG tablet Commonly known as:  NORVASC Take 5 mg by mouth daily.   hydrochlorothiazide 25 MG tablet Commonly known as:  HYDRODIURIL Take 1 tablet (25 mg total) by mouth daily.   HYDROcodone-acetaminophen 5-325 MG tablet Commonly known as:  NORCO Take 1 tablet by mouth every 6 (six) hours as needed.   lisinopril 40 MG tablet Commonly known as:  PRINIVIL,ZESTRIL Take 1 tablet (40 mg total) daily by mouth.   meloxicam 15 MG tablet Commonly known as:  MOBIC TAKE 1 TABLET (15 MG TOTAL) BY MOUTH DAILY.   nortriptyline 10 MG capsule Commonly known as:  PAMELOR Take 1 capsule (10 mg total) by mouth at bedtime.   pravastatin 20 MG tablet Commonly known as:  PRAVACHOL Take 1 tablet (20 mg total) daily by mouth.   Vitamin D (Ergocalciferol) 50000 units Caps capsule Commonly known as:  DRISDOL TAKE 1 CAPSULE (50,000 UNITS TOTAL) BY MOUTH EVERY 7 (SEVEN) DAYS.          Objective:    BP 94/64   Pulse 84   Temp (!) 97.1 F (36.2 C) (  Oral)   Ht 6' (1.829 m)   Wt 208 lb (94.3 kg)   BMI 28.21 kg/m   Wt Readings from Last 3 Encounters:  02/10/18 208 lb (94.3 kg)  01/28/18 220 lb (99.8 kg)  10/22/17 214 lb (97.1 kg)    Physical Exam  Constitutional: He is oriented to person, place, and time. He appears well-developed and well-nourished. No distress.  Eyes: Conjunctivae are normal. No scleral icterus.  Neck: Neck supple. No thyromegaly present.  Cardiovascular: Normal rate, regular rhythm, normal heart sounds and intact distal pulses.  No murmur heard. Pulmonary/Chest: Effort normal and breath sounds normal. No respiratory distress. He has no wheezes.  Musculoskeletal: Normal range of motion. He exhibits no edema or tenderness (No tenderness on exam today).    Lymphadenopathy:    He has no cervical adenopathy.  Neurological: He is alert and oriented to person, place, and time. Coordination normal.  Skin: Skin is warm and dry. No rash noted. He is not diaphoretic.  Psychiatric: He has a normal mood and affect. His behavior is normal.  Nursing note and vitals reviewed.     Assessment & Plan:   Problem List Items Addressed This Visit    None    Visit Diagnoses    Muscle cramp    -  Primary   Happens more with using hot and humid environments, possibly related to hydrochlorothiazide and dehydration   Relevant Orders   BMP8+EGFR   Magnesium      BP low, stop amlodipine, patient is taking that and the hydrochlorothiazide and lisinopril.  Continue lisinopril hydrochlorothiazide, will test electrolytes but if persists with the symptoms then may need to stop the hydrochlorothiazide  Cramping likely due to hydrochlorothiazide or dehydration  Follow up plan: Return if symptoms worsen or fail to improve.  Counseling provided for all of the vaccine components Orders Placed This Encounter  Procedures  . BMP8+EGFR  . Magnesium    Caryl Pina, MD [facility] 02/10/2018, 9:12 AM

## 2018-02-10 NOTE — Patient Instructions (Signed)
BP low, stop amlodipine, patient is taking that and the hydrochlorothiazide and lisinopril.  Continue lisinopril hydrochlorothiazide, will test electrolytes but if persists with the symptoms then may need to stop the hydrochlorothiazide

## 2018-02-11 LAB — BMP8+EGFR
BUN / CREAT RATIO: 20 (ref 10–24)
BUN: 26 mg/dL (ref 8–27)
CALCIUM: 10.1 mg/dL (ref 8.6–10.2)
CHLORIDE: 93 mmol/L — AB (ref 96–106)
CO2: 31 mmol/L — ABNORMAL HIGH (ref 20–29)
Creatinine, Ser: 1.33 mg/dL — ABNORMAL HIGH (ref 0.76–1.27)
GFR calc non Af Amer: 58 mL/min/{1.73_m2} — ABNORMAL LOW (ref >59)
GFR, EST AFRICAN AMERICAN: 67 mL/min/{1.73_m2} (ref >59)
Glucose: 105 mg/dL — ABNORMAL HIGH (ref 65–99)
Potassium: 3.9 mmol/L (ref 3.5–5.2)
Sodium: 142 mmol/L (ref 134–144)

## 2018-02-11 LAB — MAGNESIUM: Magnesium: 2 mg/dL (ref 1.6–2.3)

## 2018-02-19 ENCOUNTER — Encounter: Payer: Self-pay | Admitting: Family Medicine

## 2018-02-19 ENCOUNTER — Ambulatory Visit: Payer: 59 | Admitting: Family Medicine

## 2018-02-19 VITALS — BP 135/84 | HR 72 | Temp 97.7°F | Ht 72.0 in | Wt 212.0 lb

## 2018-02-19 DIAGNOSIS — I10 Essential (primary) hypertension: Secondary | ICD-10-CM

## 2018-02-19 DIAGNOSIS — R7989 Other specified abnormal findings of blood chemistry: Secondary | ICD-10-CM

## 2018-02-19 NOTE — Progress Notes (Signed)
   HPI  Patient presents today here for follow-up after elevated creatinine 1 week ago.  Patient was seen about 1 week ago with muscle cramps, his creatinine was found to be 1.3 from a baseline of 0.7. He states he stopped taking HCTZ and he began feeling much better.  His cramps went away.  He was doing a very hot strenuous job at the time.  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  He states that he was drinking lots of fluids but may have gotten dehydrated despite that.  PMH: Smoking status noted ROS: Per HPI  Objective: BP 135/84   Pulse 72   Temp 97.7 F (36.5 C) (Oral)   Ht 6' (1.829 m)   Wt 212 lb (96.2 kg)   BMI 28.75 kg/m  Gen: NAD, alert, cooperative with exam HEENT: NCAT CV: RRR, good S1/S2, no murmur Resp: CTABL, no wheezes, non-labored Ext: No edema, warm Neuro: Alert and oriented, No gross deficits  Assessment and plan:  #Pretension, elevated serum creatinine Patient was seen last week with muscle cramps and slightly elevated BUN/creatinine ratio at 19.  His creatinine was significantly elevated compared to previous baseline of 0.7. Patient symptoms improved after stopping HCTZ Repeat labs today He uses meloxicam intermittently, also lisinopril daily. Explained that if his fattening is not improving will likely have him come back for urine protein creatinine ratio and possibly change other medications.   Orders Placed This Encounter  Procedures  . [place], MD [facility] 02/19/2018, 3:09 PM

## 2018-02-19 NOTE — Patient Instructions (Signed)
Great to see you!   

## 2018-02-20 LAB — BMP8+EGFR
BUN/Creatinine Ratio: 18 (ref 10–24)
BUN: 15 mg/dL (ref 8–27)
CHLORIDE: 102 mmol/L (ref 96–106)
CO2: 24 mmol/L (ref 20–29)
Calcium: 9.5 mg/dL (ref 8.6–10.2)
Creatinine, Ser: 0.84 mg/dL (ref 0.76–1.27)
GFR calc non Af Amer: 95 mL/min/{1.73_m2} (ref >59)
GFR, EST AFRICAN AMERICAN: 110 mL/min/{1.73_m2} (ref >59)
GLUCOSE: 94 mg/dL (ref 65–99)
POTASSIUM: 4.4 mmol/L (ref 3.5–5.2)
SODIUM: 142 mmol/L (ref 134–144)

## 2018-05-29 ENCOUNTER — Encounter: Payer: Self-pay | Admitting: Family

## 2018-05-29 ENCOUNTER — Ambulatory Visit: Payer: 59 | Admitting: Family

## 2018-05-29 VITALS — BP 151/73 | HR 60 | Temp 97.1°F | Ht 72.0 in | Wt 212.0 lb

## 2018-05-29 DIAGNOSIS — J441 Chronic obstructive pulmonary disease with (acute) exacerbation: Secondary | ICD-10-CM | POA: Diagnosis not present

## 2018-05-29 DIAGNOSIS — F17200 Nicotine dependence, unspecified, uncomplicated: Secondary | ICD-10-CM | POA: Diagnosis not present

## 2018-05-29 MED ORDER — BENZONATATE 200 MG PO CAPS: 200.0000 mg | ORAL_CAPSULE | Freq: Three times a day (TID) | ORAL | 1 refills | Status: DC | PRN

## 2018-05-29 MED ORDER — AZITHROMYCIN 250 MG PO TABS: ORAL_TABLET | ORAL | 0 refills | Status: DC

## 2018-05-29 MED ORDER — PREDNISONE 10 MG (21) PO TBPK: ORAL_TABLET | ORAL | 0 refills | Status: DC

## 2018-05-29 NOTE — Progress Notes (Signed)
Subjective:    Patient ID: Louis [name], male    DOB: [birth year], 62 y.o.   MRN: [id number]  Chief Complaint  Patient presents with  . Cough    x 3-4 weeks Patient states he has been taking mucinex OTC and cough meds.  . Nasal Congestion    Cough  This is a new problem. The current episode started 1 to 4 weeks ago. The problem has been unchanged. The problem occurs every few minutes. The cough is productive of sputum and productive of purulent sputum. Associated symptoms include nasal congestion, postnasal drip, shortness of breath and wheezing. Pertinent negatives include no chills, ear congestion, ear pain, fever, headaches, myalgias or sore throat. Risk factors for lung disease include smoking/tobacco exposure. He has tried rest and OTC cough suppressant for the symptoms. The treatment provided mild relief.      Review of Systems  Constitutional: Negative for chills and fever.  HENT: Positive for postnasal drip. Negative for ear pain and sore throat.   Respiratory: Positive for cough, shortness of breath and wheezing.   Musculoskeletal: Negative for myalgias.  Neurological: Negative for headaches.  All other systems reviewed and are negative.      Objective:   Physical Exam  Constitutional: He is oriented to person, place, and time. He appears well-developed and well-nourished. No distress.  HENT:  Head: Normocephalic.  Right Ear: External ear normal.  Left Ear: External ear normal.  Mouth/Throat: Oropharynx is clear and moist.  Eyes: Pupils are equal, round, and reactive to light. Right eye exhibits no discharge. Left eye exhibits no discharge.  Neck: Normal range of motion. Neck supple. No thyromegaly present.  Cardiovascular: Normal rate, regular rhythm, normal heart sounds and intact distal pulses.  No murmur heard. Pulmonary/Chest: Effort normal. No respiratory distress. He has wheezes. He has rhonchi (intermittent coarse nonproductive cough).  Abdominal: Soft. Bowel  sounds are normal. He exhibits no distension. There is no tenderness.  Musculoskeletal: Normal range of motion. He exhibits no edema or tenderness.  Neurological: He is alert and oriented to person, place, and time. He has normal reflexes. No cranial nerve deficit.  Skin: Skin is warm and dry. No rash noted. No erythema.  Psychiatric: He has a normal mood and affect. His behavior is normal. Judgment and thought content normal.  Vitals reviewed.     BP (!) 151/73   Pulse 60   Temp (!) 97.1 F (36.2 C) (Oral)   Ht 6' (1.829 m)   Wt 212 lb (96.2 kg)   SpO2 92%   BMI 28.75 kg/m      Assessment & Plan:  Asaiah Scarber comes in today with chief complaint of Cough (x 3-4 weeks Patient states he has been taking mucinex OTC and cough meds.) and Nasal Congestion   Diagnosis and orders addressed:  1. COPD exacerbation ([place]) - Take meds as prescribed - Use a cool mist humidifier  -Use saline nose sprays frequently -Force fluids -For any cough or congestion  Use plain Mucinex- regular strength or max strength is fine -For fever or aces or pains- take tylenol or ibuprofen. -Throat lozenges if help -RTO if symptoms worsen or do not improve  - predniSONE (STERAPRED UNI-PAK 21 TAB) 10 MG (21) TBPK tablet; Use as directed  Dispense: 21 tablet; Refill: 0 - azithromycin (ZITHROMAX) 250 MG tablet; Take 500 mg once, then 250 mg for four days  Dispense: 6 tablet; Refill: 0 - benzonatate (TESSALON) 200 MG capsule; Take 1 capsule (200 mg total) by  mouth 3 (three) times daily as needed.  Dispense: 30 capsule; Refill: 1  2. Smoker Smoking cessation discussed   Evelina Dun, FNP

## 2018-05-29 NOTE — Patient Instructions (Signed)

## 2018-06-25 ENCOUNTER — Ambulatory Visit: Payer: 59 | Admitting: Family Medicine

## 2018-06-25 ENCOUNTER — Encounter: Payer: Self-pay | Admitting: Family Medicine

## 2018-06-25 ENCOUNTER — Ambulatory Visit: Payer: Self-pay

## 2018-06-25 VITALS — BP 128/70 | HR 58 | Ht 72.0 in | Wt 215.0 lb

## 2018-06-25 DIAGNOSIS — M5136 Other intervertebral disc degeneration, lumbar region: Secondary | ICD-10-CM | POA: Diagnosis not present

## 2018-06-25 DIAGNOSIS — M7061 Trochanteric bursitis, right hip: Secondary | ICD-10-CM

## 2018-06-25 DIAGNOSIS — M25551 Pain in right hip: Secondary | ICD-10-CM

## 2018-06-25 DIAGNOSIS — M1611 Unilateral primary osteoarthritis, right hip: Secondary | ICD-10-CM

## 2018-06-25 DIAGNOSIS — M7062 Trochanteric bursitis, left hip: Secondary | ICD-10-CM

## 2018-06-25 NOTE — Patient Instructions (Signed)
Good to see you  Injected both sides of the hip  As long as you do well see me when you need me

## 2018-06-25 NOTE — Assessment & Plan Note (Signed)
Repeat injection given again today.  Discussed with patient about the moderate to severe arthritic changes of the back and the hip and how this could be contributing as well.  Discussed icing regimen.  Discussed possible formal physical therapy which patient declined.  Patient declined laboratory work-up.  Follow-up again in 4 weeks

## 2018-06-25 NOTE — Progress Notes (Signed)
[name] [address], [place] [address] Phone: [phone] Subjective:     CC: Right and left hip pain  [id number]  Louis [name] is a 62 y.o. male coming in with complaint of right hip pain. Pain over greater trochanter. He has been having to walk a lot at work which exacerbates his pain. Trouble sleeping at night. Feels the mobility is decreasing. Does commercial plumbing.  Noticing actually both sides.  Right is much worse than left.  No groin pain.  Does have back pain but very marginal compared to the lateral aspect of the hip.     Past Medical History:  Diagnosis Date  . DDD (degenerative disc disease), lumbar   . Elevated white blood cell count   . Hyperlipidemia   . Hypertension    Past Surgical History:  Procedure Laterality Date  . TONSILLECTOMY     Social History   Socioeconomic History  . Marital status: Married    Spouse name: Not on file  . Number of children: Not on file  . Years of education: Not on file  . Highest education level: Not on file  Occupational History  . Not on file  Social Needs  . Financial resource strain: Not on file  . Food insecurity:    Worry: Not on file    Inability: Not on file  . Transportation needs:    Medical: Not on file    Non-medical: Not on file  Tobacco Use  . Smoking status: Current Some Day Smoker    Packs/day: 0.50    Years: 30.00    Pack years: 15.00    Types: Cigarettes    Start date: 08/17/1984  . Smokeless tobacco: Current User    Types: Chew  Substance and Sexual Activity  . Alcohol use: Yes    Alcohol/week: 14.0 standard drinks    Types: 14 Cans of beer per week  . Drug use: No  . Sexual activity: Not on file  Lifestyle  . Physical activity:    Days per week: Not on file    Minutes per session: Not on file  . Stress: Not on file  Relationships  . Social connections:    Talks on phone: Not on file    Gets together: Not on file    Attends religious  service: Not on file    Active member of club or organization: Not on file    Attends meetings of clubs or organizations: Not on file    Relationship status: Not on file  Other Topics Concern  . Not on file  Social History Narrative  . Not on file   Allergies  Allergen Reactions  . Penicillins Other (See Comments)    Occurred as child not sure what type of reaction he had   Family History  Problem Relation Age of Onset  . Cancer Mother        breast    Current Outpatient Medications (Endocrine & Metabolic):  .  predniSONE (STERAPRED UNI-PAK 21 TAB) 10 MG (21) TBPK tablet, Use as directed  Current Outpatient Medications (Cardiovascular):  .  amLODipine (NORVASC) 5 MG tablet, Take 5 mg by mouth daily. .  hydrochlorothiazide (HYDRODIURIL) 25 MG tablet, Take 1 tablet (25 mg total) by mouth daily. Marland Kitchen  lisinopril (PRINIVIL,ZESTRIL) 40 MG tablet, Take 1 tablet (40 mg total) daily by mouth. .  pravastatin (PRAVACHOL) 20 MG tablet, Take 1 tablet (20 mg total) daily by mouth.  Current Outpatient Medications (  Respiratory):  .  benzonatate (TESSALON) 200 MG capsule, Take 1 capsule (200 mg total) by mouth 3 (three) times daily as needed.  Current Outpatient Medications (Analgesics):  .  HYDROcodone-acetaminophen (NORCO) 5-325 MG tablet, Take 1 tablet by mouth every 6 (six) hours as needed. .  meloxicam (MOBIC) 15 MG tablet, TAKE 1 TABLET (15 MG TOTAL) BY MOUTH DAILY.   Current Outpatient Medications (Other):  .  azithromycin (ZITHROMAX) 250 MG tablet, Take 500 mg once, then 250 mg for four days .  nortriptyline (PAMELOR) 10 MG capsule, Take 1 capsule (10 mg total) by mouth at bedtime. .  Vitamin D, Ergocalciferol, (DRISDOL) 50000 units CAPS capsule, TAKE 1 CAPSULE (50,000 UNITS TOTAL) BY MOUTH EVERY 7 (SEVEN) DAYS.    Past medical history, social, surgical and family history all reviewed in electronic medical record.  No pertanent information unless stated regarding to the chief  complaint.   Review of Systems:  No headache, visual changes, nausea, vomiting, diarrhea, constipation, dizziness, abdominal pain, skin rash, fevers, chills, night sweats, weight loss, swollen lymph nodes, body aches, joint swelling,  chest pain, shortness of breath, mood changes.  Positive muscle aches  Objective  Blood pressure 128/70, pulse (!) 58, height 6' (1.829 m), weight 215 lb (97.5 kg), SpO2 97 %.   General: No apparent distress alert and oriented x3 mood and affect normal, dressed appropriately.  HEENT: Pupils equal, extraocular movements intact  Respiratory: Patient's speak in full sentences and does not appear short of breath  Cardiovascular: No lower extremity edema, non tender, no erythema  Skin: Warm dry intact with no signs of infection or rash on extremities or on axial skeleton.  Abdomen: Soft nontender  Neuro: Cranial nerves II through XII are intact, neurovascularly intact in all extremities with 2+ DTRs and 2+ pulses.  Lymph: No lymphadenopathy of posterior or anterior cervical chain or axillae bilaterally.  Gait antalgic gait MSK:  tender with full range of motion and good stability and symmetric strength and tone of shoulders, elbows, wrist, knee and ankles bilaterally.  Bilateral hips with decreasing range of motion bilaterally.  Minimal internal range of motion.  Pain is more over the lateral aspect of the hips.  Back exam shows loss of lordosis with some degenerative scoliosis.  Severe tightness with straight leg test.   Procedure: Real-time Ultrasound Guided Injection of right greater trochanteric bursitis secondary to patient's body habitus Device: GE Logiq Q7 Ultrasound guided injection is preferred based studies that show increased duration, increased effect, greater accuracy, decreased procedural pain, increased response rate, and decreased cost with ultrasound guided versus blind injection.  Verbal informed consent obtained.  Time-out conducted.  Noted no  overlying erythema, induration, or other signs of local infection.  Skin prepped in a sterile fashion.  Local anesthesia: Topical Ethyl chloride.  With sterile technique and under real time ultrasound guidance:  Greater trochanteric area was visualized and patient's bursa was noted. A 22-gauge 3 inch needle was inserted and 4 cc of 0.5% Marcaine and 1 cc of Kenalog 40 mg/dL was injected. Pictures taken Completed without difficulty  Pain immediately resolved suggesting accurate placement of the medication.  Advised to call if fevers/chills, erythema, induration, drainage, or persistent bleeding.  Images permanently stored and available for review in the ultrasound unit.  Impression: Technically successful ultrasound guided injection.   Procedure: Real-time Ultrasound Guided Injection of left  greater trochanteric bursitis secondary to patient's body habitus Device: GE Logiq Q7  Ultrasound guided injection is preferred based studies that show  increased duration, increased effect, greater accuracy, decreased procedural pain, increased response rate, and decreased cost with ultrasound guided versus blind injection.  Verbal informed consent obtained.  Time-out conducted.  Noted no overlying erythema, induration, or other signs of local infection.  Skin prepped in a sterile fashion.  Local anesthesia: Topical Ethyl chloride.  With sterile technique and under real time ultrasound guidance:  Greater trochanteric area was visualized and patient's bursa was noted. A 22-gauge 3 inch needle was inserted and 4 cc of 0.5% Marcaine and 1 cc of Kenalog 40 mg/dL was injected. Pictures taken Completed without difficulty  Pain immediately resolved suggesting accurate placement of the medication.  Advised to call if fevers/chills, erythema, induration, drainage, or persistent bleeding.  Images permanently stored and available for review in the ultrasound unit.  Impression: Technically successful ultrasound  guided injection.   Impression and Recommendations:     This case required medical decision making of moderate complexity. The above documentation has been reviewed and is accurate and complete Lyndal Pulley       Note: This dictation was prepared with Dragon dictation along with smaller phrase technology. Any transcriptional errors that result from this process are unintentional.

## 2018-07-23 ENCOUNTER — Other Ambulatory Visit: Payer: Self-pay | Admitting: *Deleted

## 2018-07-23 MED ORDER — PRAVASTATIN SODIUM 20 MG PO TABS: 20.0000 mg | ORAL_TABLET | Freq: Every day | ORAL | 0 refills | Status: DC

## 2018-07-23 MED ORDER — LISINOPRIL 40 MG PO TABS: 40.0000 mg | ORAL_TABLET | Freq: Every day | ORAL | 0 refills | Status: DC

## 2018-07-23 NOTE — Telephone Encounter (Signed)
OV 07/28/18

## 2018-07-23 NOTE — Addendum Note (Signed)
Addended by: Antonietta Barcelona D on: 07/23/2018 08:54 AM   Modules accepted: Orders

## 2018-07-28 ENCOUNTER — Encounter: Payer: Self-pay | Admitting: Family Medicine

## 2018-07-28 ENCOUNTER — Ambulatory Visit (INDEPENDENT_AMBULATORY_CARE_PROVIDER_SITE_OTHER): Payer: 59 | Admitting: Family Medicine

## 2018-07-28 VITALS — BP 140/70 | HR 73 | Temp 97.4°F | Ht 72.0 in | Wt 206.0 lb

## 2018-07-28 DIAGNOSIS — E782 Mixed hyperlipidemia: Secondary | ICD-10-CM | POA: Diagnosis not present

## 2018-07-28 DIAGNOSIS — M5136 Other intervertebral disc degeneration, lumbar region: Secondary | ICD-10-CM

## 2018-07-28 DIAGNOSIS — Z0000 Encounter for general adult medical examination without abnormal findings: Secondary | ICD-10-CM

## 2018-07-28 DIAGNOSIS — Z23 Encounter for immunization: Secondary | ICD-10-CM | POA: Diagnosis not present

## 2018-07-28 DIAGNOSIS — I10 Essential (primary) hypertension: Secondary | ICD-10-CM

## 2018-07-28 MED ORDER — LISINOPRIL 40 MG PO TABS: 40.0000 mg | ORAL_TABLET | Freq: Every day | ORAL | 1 refills | Status: DC

## 2018-07-28 MED ORDER — HYDROCODONE-ACETAMINOPHEN 5-325 MG PO TABS: 1.0000 | ORAL_TABLET | Freq: Four times a day (QID) | ORAL | 0 refills | Status: DC | PRN

## 2018-07-28 MED ORDER — PRAVASTATIN SODIUM 20 MG PO TABS: 20.0000 mg | ORAL_TABLET | Freq: Every day | ORAL | 1 refills | Status: DC

## 2018-07-28 NOTE — Progress Notes (Signed)
BP 140/70   Pulse 73   Temp (!) 97.4 F (36.3 C) (Oral)   Ht 6' (1.829 m)   Wt 206 lb (93.4 kg)   SpO2 96%   BMI 27.94 kg/m    Subjective:    Patient ID: Louis [name], male    DOB: Apr 22, 1956, 62 y.o.   MRN: [id number]  HPI: Louis [name] is a 62 y.o. male presenting on 07/28/2018 for Annual Exam; Cough (x 3 weeks otc mucinex); and Nasal Congestion   HPI Well adult exam and recheck on hypertension cholesterol Patient is coming in today for well adult exam and recheck on chronic health issues including hypertension and cholesterol and chronic back pain.  He currently takes meloxicam and hydrocodone for his chronic back pain and lisinopril for his blood pressure and pravastatin for his cholesterol.  Patient says is doing well on this and denies any major health issues.  He does say he has a little bit of nasal congestion and cough is been going on for 3 weeks but does seem to be improving with over-the-counter Mucinex.  He denies any fevers or chills or shortness of breath or wheezing currently. Patient denies any chest pain, shortness of breath, headaches or vision issues, abdominal complaints, diarrhea, nausea, vomiting, or joint issues.   Relevant past medical, surgical, family and social history reviewed and updated as indicated. Interim medical history since our last visit reviewed. Allergies and medications reviewed and updated.  Review of Systems  Constitutional: Negative for chills and fever.  Eyes: Negative for visual disturbance.  Respiratory: Negative for shortness of breath and wheezing.   Cardiovascular: Negative for chest pain and leg swelling.  Gastrointestinal: Negative for abdominal pain.  Musculoskeletal: Positive for back pain. Negative for arthralgias and gait problem.  Skin: Negative for rash.  Neurological: Negative for dizziness, weakness, light-headedness and numbness.  All other systems reviewed and are negative.   Per HPI unless specifically indicated  above   Allergies as of 07/28/2018      Reactions   Penicillins Other (See Comments)   Occurred as child not sure what type of reaction he had      Medication List        Accurate as of 07/28/18 11:59 PM. Always use your most recent med list.          HYDROcodone-acetaminophen 5-325 MG tablet Commonly known as:  NORCO/VICODIN Take 1 tablet by mouth every 6 (six) hours as needed.   lisinopril 40 MG tablet Commonly known as:  PRINIVIL,ZESTRIL Take 1 tablet (40 mg total) by mouth daily.   meloxicam 15 MG tablet Commonly known as:  MOBIC TAKE 1 TABLET (15 MG TOTAL) BY MOUTH DAILY.   pravastatin 20 MG tablet Commonly known as:  PRAVACHOL Take 1 tablet (20 mg total) by mouth daily.   Vitamin D (Ergocalciferol) 1.25 MG (50000 UT) Caps capsule Commonly known as:  DRISDOL TAKE 1 CAPSULE (50,000 UNITS TOTAL) BY MOUTH EVERY 7 (SEVEN) DAYS.          Objective:    BP 140/70   Pulse 73   Temp (!) 97.4 F (36.3 C) (Oral)   Ht 6' (1.829 m)   Wt 206 lb (93.4 kg)   SpO2 96%   BMI 27.94 kg/m   Wt Readings from Last 3 Encounters:  07/28/18 206 lb (93.4 kg)  06/25/18 215 lb (97.5 kg)  05/29/18 212 lb (96.2 kg)    Physical Exam  Constitutional: He is oriented to person, place, and time.  He appears well-developed and well-nourished. No distress.  Eyes: Pupils are equal, round, and reactive to light. Conjunctivae and EOM are normal. No scleral icterus.  Neck: Neck supple. No thyromegaly present.  Cardiovascular: Normal rate, regular rhythm, normal heart sounds and intact distal pulses.  No murmur heard. Pulmonary/Chest: Effort normal and breath sounds normal. No respiratory distress. He has no wheezes.  Musculoskeletal: Normal range of motion. He exhibits no edema.  Lymphadenopathy:    He has no cervical adenopathy.  Neurological: He is alert and oriented to person, place, and time. Coordination normal.  Skin: Skin is warm and dry. No rash noted. He is not diaphoretic.    Psychiatric: He has a normal mood and affect. His behavior is normal.  Nursing note and vitals reviewed.       Assessment & Plan:   Problem List Items Addressed This Visit      Cardiovascular and Mediastinum   HTN (hypertension)   Relevant Medications   lisinopril (PRINIVIL,ZESTRIL) 40 MG tablet   pravastatin (PRAVACHOL) 20 MG tablet   Other Relevant Orders   CMP14+EGFR (Completed)     Musculoskeletal and Integument   Degenerative disc disease, lumbar   Relevant Medications   HYDROcodone-acetaminophen (NORCO) 5-325 MG tablet     Other   HLD (hyperlipidemia)   Relevant Medications   lisinopril (PRINIVIL,ZESTRIL) 40 MG tablet   pravastatin (PRAVACHOL) 20 MG tablet   Other Relevant Orders   Lipid panel (Completed)    Other Visit Diagnoses    Well adult exam    -  Primary   Relevant Orders   CBC with Differential/Platelet (Completed)   CMP14+EGFR (Completed)   Lipid panel (Completed)   Need for immunization against influenza       Relevant Orders   Flu Vaccine QUAD 36+ mos IM (Completed)       Follow up plan: Return in about 6 months (around 01/26/2019), or if symptoms worsen or fail to improve, for Hypertension cholesterol recheck.  Counseling provided for all of the vaccine components Orders Placed This Encounter  Procedures  . Flu Vaccine QUAD 36+ mos IM  . CBC with Differential/Platelet  . CMP14+EGFR  . Lipid panel    Caryl Pina, MD [facility] 08/03/2018, 9:27 PM

## 2018-07-29 LAB — CMP14+EGFR
ALK PHOS: 96 IU/L (ref 39–117)
ALT: 44 IU/L (ref 0–44)
AST: 39 IU/L (ref 0–40)
Albumin/Globulin Ratio: 2.5 — ABNORMAL HIGH (ref 1.2–2.2)
Albumin: 4.8 g/dL (ref 3.6–4.8)
BILIRUBIN TOTAL: 0.6 mg/dL (ref 0.0–1.2)
BUN/Creatinine Ratio: 14 (ref 10–24)
BUN: 11 mg/dL (ref 8–27)
CHLORIDE: 100 mmol/L (ref 96–106)
CO2: 26 mmol/L (ref 20–29)
Calcium: 9.5 mg/dL (ref 8.6–10.2)
Creatinine, Ser: 0.76 mg/dL (ref 0.76–1.27)
GFR calc Af Amer: 114 mL/min/{1.73_m2} (ref >59)
GFR calc non Af Amer: 98 mL/min/{1.73_m2} (ref >59)
GLUCOSE: 99 mg/dL (ref 65–99)
Globulin, Total: 1.9 g/dL (ref 1.5–4.5)
Potassium: 4.3 mmol/L (ref 3.5–5.2)
Sodium: 141 mmol/L (ref 134–144)
Total Protein: 6.7 g/dL (ref 6.0–8.5)

## 2018-07-29 LAB — CBC WITH DIFFERENTIAL/PLATELET
BASOS ABS: 0.1 10*3/uL (ref 0.0–0.2)
BASOS: 1 %
EOS (ABSOLUTE): 0.1 10*3/uL (ref 0.0–0.4)
Eos: 1 %
Hematocrit: 40.9 % (ref 37.5–51.0)
Hemoglobin: 14.7 g/dL (ref 13.0–17.7)
Immature Grans (Abs): 0.1 10*3/uL (ref 0.0–0.1)
Immature Granulocytes: 1 %
Lymphocytes Absolute: 3.1 10*3/uL (ref 0.7–3.1)
Lymphs: 32 %
MCH: 33.4 pg — AB (ref 26.6–33.0)
MCHC: 35.9 g/dL — ABNORMAL HIGH (ref 31.5–35.7)
MCV: 93 fL (ref 79–97)
MONOS ABS: 0.7 10*3/uL (ref 0.1–0.9)
Monocytes: 7 %
NEUTROS ABS: 5.7 10*3/uL (ref 1.4–7.0)
Neutrophils: 58 %
PLATELETS: 274 10*3/uL (ref 150–450)
RBC: 4.4 x10E6/uL (ref 4.14–5.80)
RDW: 12.7 % (ref 12.3–15.4)
WBC: 9.7 10*3/uL (ref 3.4–10.8)

## 2018-07-29 LAB — LIPID PANEL
CHOLESTEROL TOTAL: 174 mg/dL (ref 100–199)
Chol/HDL Ratio: 2.5 ratio (ref 0.0–5.0)
HDL: 69 mg/dL (ref >39)
LDL Calculated: 90 mg/dL (ref 0–99)
Triglycerides: 77 mg/dL (ref 0–149)
VLDL Cholesterol Cal: 15 mg/dL (ref 5–40)

## 2018-08-27 ENCOUNTER — Encounter: Payer: Self-pay | Admitting: Family Medicine

## 2018-08-27 ENCOUNTER — Ambulatory Visit: Payer: 59 | Admitting: Family Medicine

## 2018-08-27 VITALS — BP 148/90 | HR 73 | Ht 72.0 in | Wt 213.0 lb

## 2018-08-27 DIAGNOSIS — M7062 Trochanteric bursitis, left hip: Secondary | ICD-10-CM | POA: Diagnosis not present

## 2018-08-27 DIAGNOSIS — M25551 Pain in right hip: Secondary | ICD-10-CM

## 2018-08-27 DIAGNOSIS — M7061 Trochanteric bursitis, right hip: Secondary | ICD-10-CM | POA: Diagnosis not present

## 2018-08-27 MED ORDER — KETOROLAC TROMETHAMINE 60 MG/2ML IM SOLN
60.0000 mg | Freq: Once | INTRAMUSCULAR | Status: AC
  Administered 2018-08-27: 60 mg via INTRAMUSCULAR

## 2018-08-27 MED ORDER — PREDNISONE 5 MG PO TABS: ORAL_TABLET | ORAL | 0 refills | Status: DC

## 2018-08-27 NOTE — Progress Notes (Signed)
Louis [name] - 62 y.o. male MRN [id number]  Date of birth: 1956/06/26  SUBJECTIVE:  Including CC & ROS.  Chief Complaint  Patient presents with  . Follow-up    R hip pain   I, Wendy Poet, PT, LAT, ATC, am serving as scribe for Clearance Coots, MD.  Louis [name] is a 62 y.o. male that is presenting today w/ c/o R hip pain.  He was last seen by Dr. Tamala Julian on 06/25/18 and had a R hip injection at that time.  He states that he got a few weeks relief from the last injection but notes recent worsening over the past 1-2 weeks.  Pt denies any radiating pain into the R LE, just isolated R hip pain.  Pt also denies any N/T into the R LE and denies any R hip mechanical symptoms.  Pt's symptoms are aggravated w/ walking.  Independent review of the right hip x-ray from 2018 shows joint space arthritic change.   Review of Systems  Constitutional: Negative for fever.  HENT: Negative for congestion.   Respiratory: Negative for cough.   Cardiovascular: Negative for chest pain.  Gastrointestinal: Negative for abdominal pain.  Musculoskeletal: Positive for arthralgias and gait problem.  Skin: Negative for color change.  Neurological: Negative for weakness.  Hematological: Negative for adenopathy.  Psychiatric/Behavioral: Negative for agitation.    HISTORY: Past Medical, Surgical, Social, and Family History Reviewed & Updated per EMR.   Pertinent Historical Findings include:  Past Medical History:  Diagnosis Date  . DDD (degenerative disc disease), lumbar   . Elevated white blood cell count   . Hyperlipidemia   . Hypertension     Past Surgical History:  Procedure Laterality Date  . TONSILLECTOMY      Allergies  Allergen Reactions  . Penicillins Other (See Comments)    Occurred as child not sure what type of reaction he had    Family History  Problem Relation Age of Onset  . Cancer Mother        breast     Social History   Socioeconomic History  . Marital status: Married   Spouse name: Not on file  . Number of children: Not on file  . Years of education: Not on file  . Highest education level: Not on file  Occupational History  . Not on file  Social Needs  . Financial resource strain: Not on file  . Food insecurity:    Worry: Not on file    Inability: Not on file  . Transportation needs:    Medical: Not on file    Non-medical: Not on file  Tobacco Use  . Smoking status: Current Some Day Smoker    Packs/day: 0.50    Years: 30.00    Pack years: 15.00    Types: Cigarettes    Start date: 08/17/1984  . Smokeless tobacco: Current User    Types: Chew  Substance and Sexual Activity  . Alcohol use: Yes    Alcohol/week: 14.0 standard drinks    Types: 14 Cans of beer per week  . Drug use: No  . Sexual activity: Not on file  Lifestyle  . Physical activity:    Days per week: Not on file    Minutes per session: Not on file  . Stress: Not on file  Relationships  . Social connections:    Talks on phone: Not on file    Gets together: Not on file    Attends religious service: Not on file  Active member of club or organization: Not on file    Attends meetings of clubs or organizations: Not on file    Relationship status: Not on file  . Intimate partner violence:    Fear of current or ex partner: Not on file    Emotionally abused: Not on file    Physically abused: Not on file    Forced sexual activity: Not on file  Other Topics Concern  . Not on file  Social History Narrative  . Not on file     PHYSICAL EXAM:  VS: BP (!) 148/90 (BP Location: Left Arm, Patient Position: Sitting, Cuff Size: Large)   Pulse 73   Ht 6' (1.829 m)   Wt 213 lb (96.6 kg)   SpO2 94%   BMI 28.89 kg/m  Physical Exam Gen: NAD, alert, cooperative with exam, well-appearing ENT: normal lips, normal nasal mucosa,  Eye: normal EOM, normal conjunctiva and lids CV:  no edema, +2 pedal pulses   Resp: no accessory muscle use, non-labored,   Skin: no rashes, no areas of  induration  Neuro: normal tone, normal sensation to touch Psych:  normal insight, alert and oriented MSK:  Right hip:  Tender to palpation over the greater trochanter and gluteus. Normal internal and external rotation. Normal strength resistance with hip flexion, knee flexion extension, plantar flexion dorsiflexion. Negative straight leg raise. Reflexes are symmetric. Neurovascularly intact     ASSESSMENT & PLAN:   Greater trochanteric bursitis of both hips Pain seems to be similar to previous injections.  May need to consider evaluating his back or his hip joint as a source of some of his pain as Dr. Tamala Julian as previously mentioned.  Too soon for another greater trochanteric injection today. - IM Toradol -Prednisone. -Counseled on home exercise therapy and supportive care. -If no improvement need to consider lumbar films or intra-articular hip injection.Marland Kitchen

## 2018-08-27 NOTE — Patient Instructions (Addendum)
Nice to meet you  Please try the prednisone after injection if you're still having pain  Please try the exercises  Please see [place] back if the pain returns and we may need to try injections in different spots  Please try heat or ice on the area  Happy Holidays

## 2018-08-30 NOTE — Assessment & Plan Note (Addendum)
Pain seems to be similar to previous injections.  May need to consider evaluating his back or his hip joint as a source of some of his pain as Dr. Tamala Julian as previously mentioned.  Too soon for another greater trochanteric injection today. - IM Toradol -Prednisone. -Counseled on home exercise therapy and supportive care. -If no improvement need to consider lumbar films or intra-articular hip injection.Marland Kitchen

## 2018-09-16 ENCOUNTER — Telehealth: Payer: Self-pay | Admitting: *Deleted

## 2018-09-16 NOTE — Telephone Encounter (Signed)
Pt scheduled 09/20/2018 

## 2018-09-16 NOTE — Telephone Encounter (Signed)
lmovm for pt to return call.  

## 2018-09-16 NOTE — Telephone Encounter (Signed)
Copied from [place] [phone]. Topic: Appointment Scheduling - Scheduling Inquiry for Clinic >> Sep 16, 2018  7:28 AM Scherrie Gerlach wrote: Reason for CRM: pt states he needs a steroid inj in his hip.  Pt states Dr Tamala Julian advised him that if he needed an injection to call and the dr will work him in. Please advise, thanks

## 2018-09-19 NOTE — Progress Notes (Signed)
[name] [address], [place] [address] Phone: [phone] Subjective:     CC: Right hip pain  DPO:[id number]  Louis [name] is a 63 y.o. male coming in with complaint of right hip pain. Did not have as much relief of injection in October. Came back to see Dr. Raeford Razor who gave patient Toradol. Is having a hard time getting up from the floor. Has to have co-workers lift him up off the ground. Uses Salonpas patches on right hip.    Patient was last seen given injection in October 2019 for bilateral greater trochanteric bursitis.  Past Medical History:  Diagnosis Date  . DDD (degenerative disc disease), lumbar   . Elevated white blood cell count   . Hyperlipidemia   . Hypertension    Past Surgical History:  Procedure Laterality Date  . TONSILLECTOMY     Social History   Socioeconomic History  . Marital status: Married    Spouse name: Not on file  . Number of children: Not on file  . Years of education: Not on file  . Highest education level: Not on file  Occupational History  . Not on file  Social Needs  . Financial resource strain: Not on file  . Food insecurity:    Worry: Not on file    Inability: Not on file  . Transportation needs:    Medical: Not on file    Non-medical: Not on file  Tobacco Use  . Smoking status: Current Some Day Smoker    Packs/day: 0.50    Years: 30.00    Pack years: 15.00    Types: Cigarettes    Start date: 08/17/1984  . Smokeless tobacco: Current User    Types: Chew  Substance and Sexual Activity  . Alcohol use: Yes    Alcohol/week: 14.0 standard drinks    Types: 14 Cans of beer per week  . Drug use: No  . Sexual activity: Not on file  Lifestyle  . Physical activity:    Days per week: Not on file    Minutes per session: Not on file  . Stress: Not on file  Relationships  . Social connections:    Talks on phone: Not on file    Gets together: Not on file    Attends religious service: Not on  file    Active member of club or organization: Not on file    Attends meetings of clubs or organizations: Not on file    Relationship status: Not on file  Other Topics Concern  . Not on file  Social History Narrative  . Not on file   Allergies  Allergen Reactions  . Penicillins Other (See Comments)    Occurred as child not sure what type of reaction he had   Family History  Problem Relation Age of Onset  . Cancer Mother        breast    Current Outpatient Medications (Endocrine & Metabolic):  .  predniSONE (DELTASONE) 5 MG tablet, Take 6 pills for first day, 5 pills second day, 4 pills third day, 3 pills fourth day, 2 pills the fifth day, and 1 pill sixth day.  Current Outpatient Medications (Cardiovascular):  .  lisinopril (PRINIVIL,ZESTRIL) 40 MG tablet, Take 1 tablet (40 mg total) by mouth daily. .  pravastatin (PRAVACHOL) 20 MG tablet, Take 1 tablet (20 mg total) by mouth daily.   Current Outpatient Medications (Analgesics):  .  HYDROcodone-acetaminophen (NORCO) 5-325 MG tablet, Take 1 tablet  by mouth every 6 (six) hours as needed. .  meloxicam (MOBIC) 15 MG tablet, TAKE 1 TABLET (15 MG TOTAL) BY MOUTH DAILY.   Current Outpatient Medications (Other):  Marland Kitchen  Vitamin D, Ergocalciferol, (DRISDOL) 1.25 MG (50000 UT) CAPS capsule, Take 1 capsule (50,000 Units total) by mouth every 7 (seven) days.    Past medical history, social, surgical and family history all reviewed in electronic medical record.  No pertanent information unless stated regarding to the chief complaint.   Review of Systems:  No headache, visual changes, nausea, vomiting, diarrhea, constipation, dizziness, abdominal pain, skin rash, fevers, chills, night sweats, weight loss, swollen lymph nodes, body aches, joint swelling, chest pain, shortness of breath, mood changes.  Positive muscle aches  Objective  Blood pressure (!) 140/92, pulse 89, height 6' (1.829 m), weight 212 lb (96.2 kg), SpO2 97 %.    General:  No apparent distress alert and oriented x3 mood and affect normal, dressed appropriately.  HEENT: Pupils equal, extraocular movements intact  Respiratory: Patient's speak in full sentences and does not appear short of breath  Cardiovascular: No lower extremity edema, non tender, no erythema  Skin: Warm dry intact with no signs of infection or rash on extremities or on axial skeleton.  Abdomen: Soft nontender  Neuro: Cranial nerves II through XII are intact, neurovascularly intact in all extremities with 2+ DTRs and 2+ pulses.  Lymph: No lymphadenopathy of posterior or anterior cervical chain or axillae bilaterally.  Gait normal with good balance and coordination.  MSK:  Non tender with full range of motion and good stability and symmetric strength and tone of shoulders, elbows, wrist,  knee and ankles bilaterally.  Hip: Right ROM IR: 5 Deg, ER: 35 Deg, Flexion: 120 Deg, Extension: 80 Deg, Abduction: 45 Deg, Adduction: 15 Deg Strength 4 out of 5 Pelvic alignment unremarkable to inspection and palpation. Standing hip rotation and gait without trendelenburg sign / unsteadiness.  Pain over the lateral aspect of the hip Mild SI joint tenderness and normal minimal SI movement.    Procedure: Real-time Ultrasound Guided Injection of right greater trochanteric bursitis secondary to patient's body habitus Device: GE Logiq Q7 Ultrasound guided injection is preferred based studies that show increased duration, increased effect, greater accuracy, decreased procedural pain, increased response rate, and decreased cost with ultrasound guided versus blind injection.  Verbal informed consent obtained.  Time-out conducted.  Noted no overlying erythema, induration, or other signs of local infection.  Skin prepped in a sterile fashion.  Local anesthesia: Topical Ethyl chloride.  With sterile technique and under real time ultrasound guidance:  Greater trochanteric area was visualized and patient's bursa was  noted. A 22-gauge 3 inch needle was inserted and 4 cc of 0.5% Marcaine and 1 cc of Kenalog 40 mg/dL was injected. Pictures taken Completed without difficulty  Pain immediately resolved suggesting accurate placement of the medication.  Advised to call if fevers/chills, erythema, induration, drainage, or persistent bleeding.  Images permanently stored and available for review in the ultrasound unit.  Impression: Technically successful ultrasound guided injection.   Impression and Recommendations:     This case required medical decision making of moderate complexity. The above documentation has been reviewed and is accurate and complete Louis Pulley, DO       Note: This dictation was prepared with Dragon dictation along with smaller phrase technology. Any transcriptional errors that result from this process are unintentional.

## 2018-09-20 ENCOUNTER — Encounter: Payer: Self-pay | Admitting: Family Medicine

## 2018-09-20 ENCOUNTER — Ambulatory Visit: Payer: 59 | Admitting: Family Medicine

## 2018-09-20 ENCOUNTER — Ambulatory Visit: Payer: Self-pay

## 2018-09-20 VITALS — BP 140/92 | HR 89 | Ht 72.0 in | Wt 212.0 lb

## 2018-09-20 DIAGNOSIS — M7062 Trochanteric bursitis, left hip: Secondary | ICD-10-CM

## 2018-09-20 DIAGNOSIS — M25551 Pain in right hip: Secondary | ICD-10-CM | POA: Diagnosis not present

## 2018-09-20 DIAGNOSIS — M7061 Trochanteric bursitis, right hip: Secondary | ICD-10-CM

## 2018-09-20 MED ORDER — VITAMIN D (ERGOCALCIFEROL) 1.25 MG (50000 UNIT) PO CAPS: 50000.0000 [IU] | ORAL_CAPSULE | ORAL | 0 refills | Status: DC

## 2018-09-20 NOTE — Assessment & Plan Note (Signed)
Right-sided given injection September 20, 2018.  Responding well to the injection.  Hopefully this will be beneficial.  I do believe the patient's arthritis of the right hip is probably worsening as well.  Patient is encouraged and discussed with an orthopedic surgeon about the possibility of replacement in the next year or 2.  Patient will continue the home exercises icing regimen.  Follow-up with me again in 4 to 8 weeks

## 2018-09-20 NOTE — Patient Instructions (Signed)
Good to see you  Injected side of hip again today  Refilled once weekly vitamin D for next 12 weeks Ice 20 minutes 2 times daily. Usually after activity and before bed. Have friend call [phone] See me again in 4-6 weeks

## 2018-10-22 ENCOUNTER — Encounter: Payer: Self-pay | Admitting: Family Medicine

## 2018-10-22 ENCOUNTER — Ambulatory Visit: Payer: 59 | Admitting: Family Medicine

## 2018-10-22 VITALS — BP 158/77 | HR 75 | Temp 97.6°F | Ht 72.0 in | Wt 212.0 lb

## 2018-10-22 DIAGNOSIS — J101 Influenza due to other identified influenza virus with other respiratory manifestations: Secondary | ICD-10-CM | POA: Diagnosis not present

## 2018-10-22 DIAGNOSIS — R6889 Other general symptoms and signs: Secondary | ICD-10-CM

## 2018-10-22 LAB — VERITOR FLU A/B WAIVED
Influenza A: NEGATIVE
Influenza B: POSITIVE — AB

## 2018-10-22 MED ORDER — OSELTAMIVIR PHOSPHATE 75 MG PO CAPS: 75.0000 mg | ORAL_CAPSULE | Freq: Two times a day (BID) | ORAL | 0 refills | Status: AC

## 2018-10-22 NOTE — Progress Notes (Signed)
Subjective:  Patient ID: Louis [name], male    DOB: [birth year], 63 y.o.   MRN: [id number]  Chief Complaint:  Body aches, chest and sinus congestion (body aches x 1 day, congestion x 1 week)   HPI: Louis [name] is a 63 y.o. male presenting on 10/22/2018 for Body aches, chest and sinus congestion (body aches x 1 day, congestion x 1 week)  Pt states he has had congestion for a week. States yesterday he started with fever, chills, myalgias, and headache. States he has been taking Mucinex without relief of symptoms.   Relevant past medical, surgical, family, and social history reviewed and updated as indicated.  Allergies and medications reviewed and updated.   Past Medical History:  Diagnosis Date  . DDD (degenerative disc disease), lumbar   . Elevated white blood cell count   . Hyperlipidemia   . Hypertension     Past Surgical History:  Procedure Laterality Date  . TONSILLECTOMY      Social History   Socioeconomic History  . Marital status: Married    Spouse name: Not on file  . Number of children: Not on file  . Years of education: Not on file  . Highest education level: Not on file  Occupational History  . Not on file  Social Needs  . Financial resource strain: Not on file  . Food insecurity:    Worry: Not on file    Inability: Not on file  . Transportation needs:    Medical: Not on file    Non-medical: Not on file  Tobacco Use  . Smoking status: Current Some Day Smoker    Packs/day: 0.50    Years: 30.00    Pack years: 15.00    Types: Cigarettes    Start date: 08/17/1984  . Smokeless tobacco: Current User    Types: Chew  Substance and Sexual Activity  . Alcohol use: Yes    Alcohol/week: 14.0 standard drinks    Types: 14 Cans of beer per week  . Drug use: No  . Sexual activity: Not on file  Lifestyle  . Physical activity:    Days per week: Not on file    Minutes per session: Not on file  . Stress: Not on file  Relationships  . Social connections:   Talks on phone: Not on file    Gets together: Not on file    Attends religious service: Not on file    Active member of club or organization: Not on file    Attends meetings of clubs or organizations: Not on file    Relationship status: Not on file  . Intimate partner violence:    Fear of current or ex partner: Not on file    Emotionally abused: Not on file    Physically abused: Not on file    Forced sexual activity: Not on file  Other Topics Concern  . Not on file  Social History Narrative  . Not on file    Outpatient Encounter Medications as of 10/22/2018  Medication Sig  . HYDROcodone-acetaminophen (NORCO) 5-325 MG tablet Take 1 tablet by mouth every 6 (six) hours as needed.  Marland Kitchen lisinopril (PRINIVIL,ZESTRIL) 40 MG tablet Take 1 tablet (40 mg total) by mouth daily.  . meloxicam (MOBIC) 15 MG tablet TAKE 1 TABLET (15 MG TOTAL) BY MOUTH DAILY.  . pravastatin (PRAVACHOL) 20 MG tablet Take 1 tablet (20 mg total) by mouth daily.  . Vitamin D, Ergocalciferol, (DRISDOL) 1.25 MG (50000 UT) CAPS capsule  Take 1 capsule (50,000 Units total) by mouth every 7 (seven) days.  Marland Kitchen oseltamivir (TAMIFLU) 75 MG capsule Take 1 capsule (75 mg total) by mouth 2 (two) times daily for 5 days.  . [DISCONTINUED] predniSONE (DELTASONE) 5 MG tablet Take 6 pills for first day, 5 pills second day, 4 pills third day, 3 pills fourth day, 2 pills the fifth day, and 1 pill sixth day.   No facility-administered encounter medications on file as of 10/22/2018.     Allergies  Allergen Reactions  . Penicillins Other (See Comments)    Occurred as child not sure what type of reaction he had    Review of Systems  Constitutional: Positive for activity change, appetite change, chills, fatigue and fever. Negative for unexpected weight change.  HENT: Positive for congestion and sore throat.   Respiratory: Positive for cough. Negative for chest tightness and shortness of breath.   Cardiovascular: Negative for chest pain,  palpitations and leg swelling.  Musculoskeletal: Positive for myalgias.  Neurological: Positive for headaches. Negative for dizziness and weakness.  Psychiatric/Behavioral: Negative for confusion.        Objective:  BP (!) 158/77   Pulse 75   Temp 97.6 F (36.4 C) (Oral)   Ht 6' (1.829 m)   Wt 212 lb (96.2 kg)   BMI 28.75 kg/m    Wt Readings from Last 3 Encounters:  10/22/18 212 lb (96.2 kg)  09/20/18 212 lb (96.2 kg)  08/27/18 213 lb (96.6 kg)    Physical Exam Vitals signs and nursing note reviewed.  Constitutional:      General: He is in acute distress (mild).     Appearance: Normal appearance. He is well-developed and well-groomed. He is ill-appearing. He is not toxic-appearing.  HENT:     Head: Normocephalic and atraumatic.     Right Ear: Tympanic membrane, ear canal and external ear normal.     Left Ear: Tympanic membrane, ear canal and external ear normal.     Nose: Congestion and rhinorrhea present.     Mouth/Throat:     Mouth: Mucous membranes are moist.     Pharynx: Posterior oropharyngeal erythema present. No oropharyngeal exudate.  Eyes:     Extraocular Movements: Extraocular movements intact.     Conjunctiva/sclera: Conjunctivae normal.     Pupils: Pupils are equal, round, and reactive to light.  Neck:     Musculoskeletal: Neck supple. No muscular tenderness.  Cardiovascular:     Rate and Rhythm: Normal rate and regular rhythm.     Heart sounds: Normal heart sounds. No murmur. No friction rub. No gallop.   Pulmonary:     Effort: Pulmonary effort is normal. No respiratory distress.     Breath sounds: Normal breath sounds.  Lymphadenopathy:     Cervical: No cervical adenopathy.  Skin:    General: Skin is warm and dry.     Capillary Refill: Capillary refill takes less than 2 seconds.  Neurological:     General: No focal deficit present.     Mental Status: He is alert and oriented to person, place, and time.  Psychiatric:        Mood and Affect: Mood  normal.        Behavior: Behavior normal. Behavior is cooperative.        Thought Content: Thought content normal.        Judgment: Judgment normal.     Results for orders placed or performed in visit on 07/28/18  CBC with Differential/Platelet  Result  Value Ref Range   WBC 9.7 3.4 - 10.8 x10E3/uL   RBC 4.40 4.14 - 5.80 x10E6/uL   Hemoglobin 14.7 13.0 - 17.7 g/dL   Hematocrit 40.9 37.5 - 51.0 %   MCV 93 79 - 97 fL   MCH 33.4 (H) 26.6 - 33.0 pg   MCHC 35.9 (H) 31.5 - 35.7 g/dL   RDW 12.7 12.3 - 15.4 %   Platelets 274 150 - 450 x10E3/uL   Neutrophils 58 Not Estab. %   Lymphs 32 Not Estab. %   Monocytes 7 Not Estab. %   Eos 1 Not Estab. %   Basos 1 Not Estab. %   Neutrophils Absolute 5.7 1.4 - 7.0 x10E3/uL   Lymphocytes Absolute 3.1 0.7 - 3.1 x10E3/uL   Monocytes Absolute 0.7 0.1 - 0.9 x10E3/uL   EOS (ABSOLUTE) 0.1 0.0 - 0.4 x10E3/uL   Basophils Absolute 0.1 0.0 - 0.2 x10E3/uL   Immature Granulocytes 1 Not Estab. %   Immature Grans (Abs) 0.1 0.0 - 0.1 x10E3/uL  CMP14+EGFR  Result Value Ref Range   Glucose 99 65 - 99 mg/dL   BUN 11 8 - 27 mg/dL   Creatinine, Ser 0.76 0.76 - 1.27 mg/dL   GFR calc non Af Amer 98 >59 mL/min/1.73   GFR calc Af Amer 114 >59 mL/min/1.73   BUN/Creatinine Ratio 14 10 - 24   Sodium 141 134 - 144 mmol/L   Potassium 4.3 3.5 - 5.2 mmol/L   Chloride 100 96 - 106 mmol/L   CO2 26 20 - 29 mmol/L   Calcium 9.5 8.6 - 10.2 mg/dL   Total Protein 6.7 6.0 - 8.5 g/dL   Albumin 4.8 3.6 - 4.8 g/dL   Globulin, Total 1.9 1.5 - 4.5 g/dL   Albumin/Globulin Ratio 2.5 (H) 1.2 - 2.2   Bilirubin Total 0.6 0.0 - 1.2 mg/dL   Alkaline Phosphatase 96 39 - 117 IU/L   AST 39 0 - 40 IU/L   ALT 44 0 - 44 IU/L  Lipid panel  Result Value Ref Range   Cholesterol, Total 174 100 - 199 mg/dL   Triglycerides 77 0 - 149 mg/dL   HDL 69 >39 mg/dL   VLDL Cholesterol Cal 15 5 - 40 mg/dL   LDL Calculated 90 0 - 99 mg/dL   Chol/HDL Ratio 2.5 0.0 - 5.0 ratio     Positive for  influenza B  Pertinent labs & imaging results that were available during my care of the patient were reviewed by me and considered in my medical decision making.  Assessment & Plan:  Germaine was seen today for body aches, chest and sinus congestion.  Diagnoses and all orders for this visit:  Flu-like symptoms Positive for influenza B.  -     Veritor Flu A/B Waived  Influenza B Symptomatic care discussed. Medications as prescribed. Report any new or worsening symptoms.  -     oseltamivir (TAMIFLU) 75 MG capsule; Take 1 capsule (75 mg total) by mouth 2 (two) times daily for 5 days.     Continue all other maintenance medications.  Follow up plan: Return if symptoms worsen or fail to improve.  Educational handout given for influenza  The above assessment and management plan was discussed with the patient. The patient verbalized understanding of and has agreed to the management plan. Patient is aware to call the clinic if symptoms persist or worsen. Patient is aware when to return to the clinic for a follow-up visit. Patient educated on when it  is appropriate to go to the emergency department.   Monia Pouch, FNP-C [facility] [phone]

## 2018-10-22 NOTE — Patient Instructions (Signed)

## 2018-11-22 NOTE — Progress Notes (Signed)
[name] [address] [place], [place] [address] Phone: [phone] Subjective:   I Louis [name] am serving as a Education administrator for Dr. Hulan Saas.   CC: Right hip pain  SEG:[id number]     11/23/2018 Louis [name] is a 63 y.o. male coming in with complaint of right hip pain. States that his hip is not doing better.  Patient states that the pain seems to be now more in the groin.  Has x-rays showing severe osteoarthritic changes. Patient states that it is keeping him up at night, patient states that was improved with the Toradol injection he was given in December.  Patient states that now even daily activities such as going up stairs have become more difficult    Past Medical History:  Diagnosis Date  . DDD (degenerative disc disease), lumbar   . Elevated white blood cell count   . Hyperlipidemia   . Hypertension    Past Surgical History:  Procedure Laterality Date  . TONSILLECTOMY     Social History   Socioeconomic History  . Marital status: Married    Spouse name: Not on file  . Number of children: Not on file  . Years of education: Not on file  . Highest education level: Not on file  Occupational History  . Not on file  Social Needs  . Financial resource strain: Not on file  . Food insecurity:    Worry: Not on file    Inability: Not on file  . Transportation needs:    Medical: Not on file    Non-medical: Not on file  Tobacco Use  . Smoking status: Current Some Day Smoker    Packs/day: 0.50    Years: 30.00    Pack years: 15.00    Types: Cigarettes    Start date: 08/17/1984  . Smokeless tobacco: Current User    Types: Chew  Substance and Sexual Activity  . Alcohol use: Yes    Alcohol/week: 14.0 standard drinks    Types: 14 Cans of beer per week  . Drug use: No  . Sexual activity: Not on file  Lifestyle  . Physical activity:    Days per week: Not on file    Minutes per session: Not on file  . Stress: Not on file    Relationships  . Social connections:    Talks on phone: Not on file    Gets together: Not on file    Attends religious service: Not on file    Active member of club or organization: Not on file    Attends meetings of clubs or organizations: Not on file    Relationship status: Not on file  Other Topics Concern  . Not on file  Social History Narrative  . Not on file   Allergies  Allergen Reactions  . Penicillins Other (See Comments)    Occurred as child not sure what type of reaction he had   Family History  Problem Relation Age of Onset  . Cancer Mother        breast     Current Outpatient Medications (Cardiovascular):  .  lisinopril (PRINIVIL,ZESTRIL) 40 MG tablet, Take 1 tablet (40 mg total) by mouth daily. .  pravastatin (PRAVACHOL) 20 MG tablet, Take 1 tablet (20 mg total) by mouth daily.   Current Outpatient Medications (Analgesics):  .  HYDROcodone-acetaminophen (NORCO) 5-325 MG tablet, Take 1 tablet by mouth every 6 (six) hours as needed. .  meloxicam (MOBIC) 15 MG tablet, TAKE  1 TABLET (15 MG TOTAL) BY MOUTH DAILY. Marland Kitchen  diclofenac (VOLTAREN) 75 MG EC tablet, Take 1 tablet (75 mg total) by mouth 2 (two) times daily.   Current Outpatient Medications (Other):  Marland Kitchen  Vitamin D, Ergocalciferol, (DRISDOL) 1.25 MG (50000 UT) CAPS capsule, Take 1 capsule (50,000 Units total) by mouth every 7 (seven) days.    Past medical history, social, surgical and family history all reviewed in electronic medical record.  No pertanent information unless stated regarding to the chief complaint.   Review of Systems:  No headache, visual changes, nausea, vomiting, diarrhea, constipation, dizziness, abdominal pain, skin rash, fevers, chills, night sweats, weight loss, swollen lymph nodes, body aches, joint swelling,  chest pain, shortness of breath, mood changes.  Positive muscle aches  Objective  Blood pressure (!) 158/80, pulse 91, height 6' (1.829 m), weight 209 lb (94.8 kg), SpO2 93  %.    General: No apparent distress alert and oriented x3 mood and affect normal, dressed appropriately.  HEENT: Pupils equal, extraocular movements intact  Respiratory: Patient's speak in full sentences and does not appear short of breath  Cardiovascular: No lower extremity edema, non tender, no erythema  Skin: Warm dry intact with no signs of infection or rash on extremities or on axial skeleton.  Abdomen: Soft nontender  Neuro: Cranial nerves II through XII are intact, neurovascularly intact in all extremities with 2+ DTRs and 2+ pulses.  Lymph: No lymphadenopathy of posterior or anterior cervical chain or axillae bilaterally.  Gait severely antalgic gait MSK:  tender with limited range of motion and good stability and symmetric strength and tone of shoulders, elbows, wrist, knee and ankles bilaterally.   Right hip exam has no internal range of motion.  Patient has severe tenderness with internal range of motion. External range of motion is decreased as well compared to previous exam.  Forward flexion of with 4-5 strength compared to the contralateral side.  Neurovascular intact distally   Procedure: Real-time Ultrasound Guided Injection of right hip Device: GE Logiq Q7  Ultrasound guided injection is preferred based studies that show increased duration, increased effect, greater accuracy, decreased procedural pain, increased response rate with ultrasound guided versus blind injection.  Verbal informed consent obtained.  Time-out conducted.  Noted no overlying erythema, induration, or other signs of local infection.  Skin prepped in a sterile fashion.  Local anesthesia: Topical Ethyl chloride.  With sterile technique and under real time ultrasound guidance:  Anterior capsule visualized, needle visualized going to the head neck junction at the anterior capsule. Pictures taken. Patient did have injection of  3 cc of 0.5% Marcaine, and 1 cc of Kenalog 40 mg/dL. Completed without difficulty   Pain immediately resolved suggesting accurate placement of the medication.  Advised to call if fevers/chills, erythema, induration, drainage, or persistent bleeding.  Images permanently stored and available for review in the ultrasound unit.  Impression: Technically successful ultrasound guided injection.   Impression and Recommendations:     This case required medical decision making of moderate complexity. The above documentation has been reviewed and is accurate and complete Lyndal Pulley, DO       Note: This dictation was prepared with Dragon dictation along with smaller phrase technology. Any transcriptional errors that result from this process are unintentional.

## 2018-11-23 ENCOUNTER — Ambulatory Visit: Payer: Self-pay

## 2018-11-23 ENCOUNTER — Ambulatory Visit: Payer: 59 | Admitting: Family Medicine

## 2018-11-23 ENCOUNTER — Encounter: Payer: Self-pay | Admitting: Family Medicine

## 2018-11-23 VITALS — BP 158/80 | HR 91 | Ht 72.0 in | Wt 209.0 lb

## 2018-11-23 DIAGNOSIS — M25551 Pain in right hip: Secondary | ICD-10-CM

## 2018-11-23 DIAGNOSIS — M1611 Unilateral primary osteoarthritis, right hip: Secondary | ICD-10-CM | POA: Diagnosis not present

## 2018-11-23 MED ORDER — DICLOFENAC SODIUM 75 MG PO TBEC: 75.0000 mg | DELAYED_RELEASE_TABLET | Freq: Two times a day (BID) | ORAL | 3 refills | Status: DC

## 2018-11-23 NOTE — Assessment & Plan Note (Signed)
Patient has had arthritic changes in the same for quite some time and now worsening at the moment.  Injection given today and patient had near complete resolution of pain with no of course increasing range of motion though with activity.  We discussed the potential side effects of this injection and when to seek medical attention.  Discussed the potential for compression, we discussed though that at some point in the next year or 2 will need surgical intervention for hip replacement.  Patient understands.  Patient will discontinue the meloxicam and will start the diclofenac.  Warned of potential side effects with that as well.  Follow-up again in 6 weeks

## 2018-11-23 NOTE — Patient Instructions (Addendum)
Good to see you  Ice 20 minutes 2 times daily. Usually after activity and before bed. Diclofenac 2 times a day but watch for stomach pain or brusing.  Tried injection in your hip today  If this fails need to consider possible surgery  See me again in 4-6 weeks to see how you are doing

## 2018-12-07 ENCOUNTER — Other Ambulatory Visit: Payer: Self-pay | Admitting: *Deleted

## 2018-12-07 DIAGNOSIS — M5136 Other intervertebral disc degeneration, lumbar region: Secondary | ICD-10-CM

## 2018-12-07 DIAGNOSIS — M51369 Other intervertebral disc degeneration, lumbar region without mention of lumbar back pain or lower extremity pain: Secondary | ICD-10-CM

## 2018-12-10 ENCOUNTER — Other Ambulatory Visit: Payer: Self-pay

## 2018-12-13 ENCOUNTER — Other Ambulatory Visit: Payer: Self-pay

## 2018-12-14 ENCOUNTER — Encounter: Payer: Self-pay | Admitting: Family Medicine

## 2018-12-14 ENCOUNTER — Ambulatory Visit: Payer: 59 | Admitting: Family Medicine

## 2018-12-14 DIAGNOSIS — Z0289 Encounter for other administrative examinations: Secondary | ICD-10-CM

## 2018-12-14 DIAGNOSIS — M5136 Other intervertebral disc degeneration, lumbar region: Secondary | ICD-10-CM

## 2018-12-14 MED ORDER — HYDROCODONE-ACETAMINOPHEN 5-325 MG PO TABS: 1.0000 | ORAL_TABLET | Freq: Four times a day (QID) | ORAL | 0 refills | Status: DC | PRN

## 2018-12-14 NOTE — Progress Notes (Signed)
BP (!) 143/77   Pulse 82   Temp (!) 97.2 F (36.2 C) (Oral)   Ht 6\' 1"  (1.854 m)   Wt 201 lb 9.6 oz (91.4 kg)   BMI 26.60 kg/m    Subjective:   Patient ID: Louis [name], male    DOB: [birth year], 63 y.o.   MRN: [id number]  HPI: Louis [name] is a 63 y.o. male presenting on 12/14/2018 for pain managment and Medical Management of Chronic Issues   HPI Patient is coming in for pain management and refill of pain medication.  Patient is currently taking hydrocodone-acetaminophen 5-3 25 and got a prescription for 60 refilled on November 2019 Cause of pain-right hip and lower back Pain location-right hip and lower back, arthritis and degeneration Pain on scale of 1-10- 2 Frequency-daily but only gets severe once or twice a month What increases pain-being on his feet at work on concrete What makes pain Better-rest and stretching and an injection that he does had from orthopedic in his hip and anti-inflammatories Effects on ADL -does not limit him currently Any change in general medical condition-none  Current opioids rx-hydrocodone-acetaminophen 5-3 25 # meds rx- 60 Effectiveness of current meds-very effective Adverse reactions form pain meds-none Morphine equivalent- 20  Pill count performed-No Last drug screen -none ( high risk q104m, moderate risk q74m, low risk yearly ) Urine drug screen today- Yes Was the [place] reviewed-yes  If yes were their any concerning findings? -None  No flowsheet data found.   Pain contract signed on:   Relevant past medical, surgical, family and social history reviewed and updated as indicated. Interim medical history since our last visit reviewed. Allergies and medications reviewed and updated.  Review of Systems  Constitutional: Negative for chills and fever.  Respiratory: Negative for shortness of breath and wheezing.   Cardiovascular: Negative for chest pain and leg swelling.  Musculoskeletal: Positive for arthralgias and back pain. Negative  for gait problem.  Skin: Negative for rash.  All other systems reviewed and are negative.   Per HPI unless specifically indicated above   Allergies as of 12/14/2018      Reactions   Penicillins Other (See Comments)   Occurred as child not sure what type of reaction he had      Medication List       Accurate as of December 14, 2018  2:04 PM. Always use your most recent med list.        diclofenac 75 MG EC tablet Commonly known as:  VOLTAREN Take 1 tablet (75 mg total) by mouth 2 (two) times daily.   HYDROcodone-acetaminophen 5-325 MG tablet Commonly known as:  Norco Take 1 tablet by mouth every 6 (six) hours as needed.   lisinopril 40 MG tablet Commonly known as:  PRINIVIL,ZESTRIL Take 1 tablet (40 mg total) by mouth daily.   pravastatin 20 MG tablet Commonly known as:  PRAVACHOL Take 1 tablet (20 mg total) by mouth daily.   Vitamin D (Ergocalciferol) 1.25 MG (50000 UT) Caps capsule Commonly known as:  DRISDOL Take 1 capsule (50,000 Units total) by mouth every 7 (seven) days.        Objective:   BP (!) 143/77   Pulse 82   Temp (!) 97.2 F (36.2 C) (Oral)   Ht 6\' 1"  (1.854 m)   Wt 201 lb 9.6 oz (91.4 kg)   BMI 26.60 kg/m   Wt Readings from Last 3 Encounters:  12/14/18 201 lb 9.6 oz (91.4 kg)  11/23/18 209 lb (  94.8 kg)  10/22/18 212 lb (96.2 kg)    Physical Exam Vitals signs and nursing note reviewed.  Constitutional:      General: He is not in acute distress.    Appearance: He is well-developed. He is not diaphoretic.  Eyes:     General: No scleral icterus.    Conjunctiva/sclera: Conjunctivae normal.  Neck:     Musculoskeletal: Neck supple.     Thyroid: No thyromegaly.  Cardiovascular:     Rate and Rhythm: Normal rate and regular rhythm.     Heart sounds: Normal heart sounds. No murmur.  Pulmonary:     Effort: Pulmonary effort is normal. No respiratory distress.     Breath sounds: No wheezing.  Musculoskeletal: Normal range of motion.         General: Tenderness (Slight anterior tenderness over right hip, right lower lumbar tenderness) present.  Lymphadenopathy:     Cervical: No cervical adenopathy.  Skin:    General: Skin is warm and dry.     Findings: No rash.  Neurological:     Mental Status: He is alert and oriented to person, place, and time.     Coordination: Coordination normal.  Psychiatric:        Behavior: Behavior normal.       Assessment & Plan:   Problem List Items Addressed This Visit      Musculoskeletal and Integument   Degenerative disc disease, lumbar   Relevant Medications   HYDROcodone-acetaminophen (NORCO) 5-325 MG tablet   Other Relevant Orders   ToxASSURE Select 13 (MW), Urine     Other   Pain management contract signed       Follow up plan: Return in about 7 months (around 07/16/2019), or if symptoms worsen or fail to improve, for Physical and blood work.  Counseling provided for all of the vaccine components Orders Placed This Encounter  Procedures  . ToxASSURE Select 13 (MW), Urine    Caryl Pina, MD [facility] 12/14/2018, 2:04 PM

## 2018-12-17 LAB — TOXASSURE SELECT 13 (MW), URINE

## 2019-03-16 ENCOUNTER — Ambulatory Visit: Payer: 59 | Admitting: Family Medicine

## 2019-03-16 ENCOUNTER — Other Ambulatory Visit: Payer: Self-pay | Admitting: Family Medicine

## 2019-03-31 ENCOUNTER — Other Ambulatory Visit: Payer: Self-pay

## 2019-03-31 ENCOUNTER — Encounter: Payer: Self-pay | Admitting: Family Medicine

## 2019-03-31 ENCOUNTER — Ambulatory Visit: Payer: Self-pay

## 2019-03-31 ENCOUNTER — Other Ambulatory Visit: Payer: Self-pay | Admitting: Family Medicine

## 2019-03-31 ENCOUNTER — Ambulatory Visit (INDEPENDENT_AMBULATORY_CARE_PROVIDER_SITE_OTHER): Payer: 59 | Admitting: Family Medicine

## 2019-03-31 VITALS — BP 124/70 | HR 68 | Ht 73.0 in

## 2019-03-31 DIAGNOSIS — M25551 Pain in right hip: Secondary | ICD-10-CM | POA: Diagnosis not present

## 2019-03-31 DIAGNOSIS — M7061 Trochanteric bursitis, right hip: Secondary | ICD-10-CM

## 2019-03-31 DIAGNOSIS — M7062 Trochanteric bursitis, left hip: Secondary | ICD-10-CM

## 2019-03-31 NOTE — Progress Notes (Signed)
[name] [place],  [address] Phone: [phone] Subjective:   Louis [name], am serving as a scribe for Dr. Hulan Saas.  I'm seeing this patient by the request  of:    CC: Right hip pain follow-up  HAL:[id number]   11/23/2018: Patient has had arthritic changes in the same for quite some time and now worsening at the moment.  Injection given today and patient had near complete resolution of pain with [name] of course increasing range of motion though with activity.  We discussed the potential side effects of this injection and when to seek medical attention.  Discussed the potential for compression, we discussed though that at some point in the next year or 2 will need surgical intervention for hip replacement.  Patient understands.  Patient will discontinue the meloxicam and will start the diclofenac.  Warned of potential side effects with that as well.  Follow-up again in 6 weeks  Update 03/31/2019: Louis [name] is a 63 y.o. male coming in with complaint of right hip pain.  Has severe arthritic changes.  Also has had greater trochanteric bursitis bilaterally.  Patient states that he was doing a lot of yard work that irritated the left hip. Pain is intermittent and located over the greater trochanters bilateral. Has been icing and using topical analgesics. Would like injections bilaterally.  He has a pain more on the lateral aspect of the hip and groin at this time.  Still affecting daily activities though.   Last intra-articular hip injection was November 23, 2018 recently saw primary care provider and is now on a pain contract.  Past Medical History:  Diagnosis Date  . DDD (degenerative disc disease), lumbar   . Elevated white blood cell count   . Hyperlipidemia   . Hypertension    Past Surgical History:  Procedure Laterality Date  . TONSILLECTOMY     Social History   Socioeconomic History  . Marital status: Married    Spouse name:  Not on file  . Number of children: Not on file  . Years of education: Not on file  . Highest education level: Not on file  Occupational History  . Not on file  Social Needs  . Financial resource strain: Not on file  . Food insecurity    Worry: Not on file    Inability: Not on file  . Transportation needs    Medical: Not on file    Non-medical: Not on file  Tobacco Use  . Smoking status: Current Some Day Smoker    Packs/day: 0.50    Years: 30.00    Pack years: 15.00    Types: Cigarettes    Start date: 08/17/1984  . Smokeless tobacco: Current User    Types: Chew  Substance and Sexual Activity  . Alcohol use: Yes    Alcohol/week: 14.0 standard drinks    Types: 14 Cans of beer per week  . Drug use: [name]  . Sexual activity: Not on file  Lifestyle  . Physical activity    Days per week: Not on file    Minutes per session: Not on file  . Stress: Not on file  Relationships  . Social Herbalist on phone: Not on file    Gets together: Not on file    Attends religious service: Not on file    Active member of club or organization: Not on file    Attends meetings of clubs or organizations: Not  on file    Relationship status: Not on file  Other Topics Concern  . Not on file  Social History Narrative  . Not on file   Allergies  Allergen Reactions  . Penicillins Other (See Comments)    Occurred as child not sure what type of reaction he had   Family History  Problem Relation Age of Onset  . Cancer Mother        breast     Current Outpatient Medications (Cardiovascular):  .  lisinopril (ZESTRIL) 40 MG tablet, TAKE 1 TABLET BY MOUTH EVERY DAY .  pravastatin (PRAVACHOL) 20 MG tablet, Take 1 tablet (20 mg total) by mouth daily.   Current Outpatient Medications (Analgesics):  .  diclofenac (VOLTAREN) 75 MG EC tablet, TAKE 1 TABLET BY MOUTH TWICE A DAY .  HYDROcodone-acetaminophen (NORCO) 5-325 MG tablet, Take 1 tablet by mouth every 6 (six) hours as needed.    Current Outpatient Medications (Other):  Marland Kitchen  Vitamin D, Ergocalciferol, (DRISDOL) 1.25 MG (50000 UT) CAPS capsule, Take 1 capsule (50,000 Units total) by mouth every 7 (seven) days.    Past medical history, social, surgical and family history all reviewed in electronic medical record.  [name] pertanent information unless stated regarding to the chief complaint.   Review of Systems:  [name] headache, visual changes, nausea, vomiting, diarrhea, constipation, dizziness, abdominal pain, skin rash, fevers, chills, night sweats, weight loss, swollen lymph nodes, body aches, joint swelling,, chest pain, shortness of breath, mood changes.  Positive muscle aches  Objective  Blood pressure 124/70, pulse 68, height 6\' 1"  (1.854 m), SpO2 96 %.    General: [name] apparent distress alert and oriented x3 mood and affect normal, dressed appropriately.  HEENT: Pupils equal, extraocular movements intact  Respiratory: Patient's speak in full sentences and does not appear short of breath  Cardiovascular: [name] lower extremity edema, non tender, [name] erythema  Skin: Warm dry intact with [name] signs of infection or rash on extremities or on axial skeleton.  Abdomen: Soft nontender  Neuro: Cranial nerves II through XII are intact, neurovascularly intact in all extremities with 2+ DTRs and 2+ pulses.  Lymph: [name] lymphadenopathy of posterior or anterior cervical chain or axillae bilaterally.  Gait antalgic  MSK:  tender with full range of motion and good stability and symmetric strength and tone of shoulders, elbows, wrist, knee and ankles bilaterally.  Mild to moderate arthritic changes of multiple joints Bilateral hip exam shows the patient does have decreased range of motion with internal rotation left greater than right severely.  Groin area.  Patient has more severe pain over the lateral aspects of the hips bilaterally.  Seems to be over the greater trochanteric.  Significant tightness with Corky Sox test.  Negative straight leg test.   Neurovascularly intact distally.   Procedure: Real-time Ultrasound Guided Injection of right greater trochanteric bursitis secondary to patient's body habitus Device: GE Logiq Q7 Ultrasound guided injection is preferred based studies that show increased duration, increased effect, greater accuracy, decreased procedural pain, increased response rate, and decreased cost with ultrasound guided versus blind injection.  Verbal informed consent obtained.  Time-out conducted.  Noted [name] overlying erythema, induration, or other signs of local infection.  Skin prepped in a sterile fashion.  Local anesthesia: Topical Ethyl chloride.  With sterile technique and under real time ultrasound guidance:  Greater trochanteric area was visualized and patient's bursa was noted. A 22-gauge 3 inch needle was inserted and 4 cc of 0.5% Marcaine and 1 cc of Kenalog 40  mg/dL was injected. Pictures taken Completed without difficulty  Pain immediately resolved suggesting accurate placement of the medication.  Advised to call if fevers/chills, erythema, induration, drainage, or persistent bleeding.  Images permanently stored and available for review in the ultrasound unit.  Impression: Technically successful ultrasound guided injection.  Procedure: Real-time Ultrasound Guided Injection of left  greater trochanteric bursitis secondary to patient's body habitus Device: GE Logiq Q7  Ultrasound guided injection is preferred based studies that show increased duration, increased effect, greater accuracy, decreased procedural pain, increased response rate, and decreased cost with ultrasound guided versus blind injection.  Verbal informed consent obtained.  Time-out conducted.  Noted [name] overlying erythema, induration, or other signs of local infection.  Skin prepped in a sterile fashion.  Local anesthesia: Topical Ethyl chloride.  With sterile technique and under real time ultrasound guidance:  Greater trochanteric area was  visualized and patient's bursa was noted. A 22-gauge 3 inch needle was inserted and 4 cc of 0.5% Marcaine and 1 cc of Kenalog 40 mg/dL was injected. Pictures taken Completed without difficulty  Pain immediately resolved suggesting accurate placement of the medication.  Advised to call if fevers/chills, erythema, induration, drainage, or persistent bleeding.  Images permanently stored and available for review in the ultrasound unit.  Impression: Technically successful ultrasound guided injection.     Impression and Recommendations:     This case required medical decision making of moderate complexity. The above documentation has been reviewed and is accurate and complete Lyndal Pulley, DO       Note: This dictation was prepared with Dragon dictation along with smaller phrase technology. Any transcriptional errors that result from this process are unintentional.

## 2019-03-31 NOTE — Assessment & Plan Note (Signed)
Repeat injection given today.  Tolerated the procedure well.  Discussed icing regimen and home exercise.  Discussed which activities to do which was to avoid.  Follow-up again 2 months differential includes the degenerative disc disease in the lumbar spine and patient's hip arthritis.

## 2019-03-31 NOTE — Patient Instructions (Signed)
  You know the drill  Ice is your friend See me again in 2 months if needed

## 2019-04-05 ENCOUNTER — Ambulatory Visit: Payer: 59 | Admitting: Family Medicine

## 2019-04-25 ENCOUNTER — Telehealth: Payer: Self-pay

## 2019-04-25 ENCOUNTER — Other Ambulatory Visit: Payer: Self-pay

## 2019-04-25 DIAGNOSIS — G8929 Other chronic pain: Secondary | ICD-10-CM

## 2019-04-25 DIAGNOSIS — M25559 Pain in unspecified hip: Secondary | ICD-10-CM

## 2019-04-25 NOTE — Telephone Encounter (Signed)
Returned patient's call. Wife called. Per wife I referred him to orthopedic surgery for hip pain. [facility] Dr. Aretha Parrot.

## 2019-05-09 ENCOUNTER — Other Ambulatory Visit: Payer: Self-pay | Admitting: *Deleted

## 2019-05-09 MED ORDER — PREDNISONE 50 MG PO TABS: ORAL_TABLET | ORAL | 0 refills | Status: DC

## 2019-05-09 NOTE — Telephone Encounter (Signed)
Per Dr. Tamala Julian okay to send in prednison 50mg  qd #5 to pharmacy.

## 2019-05-26 ENCOUNTER — Ambulatory Visit (INDEPENDENT_AMBULATORY_CARE_PROVIDER_SITE_OTHER): Payer: 59 | Admitting: Family Medicine

## 2019-05-26 ENCOUNTER — Encounter: Payer: Self-pay | Admitting: Family Medicine

## 2019-05-26 DIAGNOSIS — M1611 Unilateral primary osteoarthritis, right hip: Secondary | ICD-10-CM

## 2019-05-26 DIAGNOSIS — Z716 Tobacco abuse counseling: Secondary | ICD-10-CM

## 2019-05-26 MED ORDER — VARENICLINE TARTRATE 1 MG PO TABS: 1.0000 mg | ORAL_TABLET | Freq: Two times a day (BID) | ORAL | 1 refills | Status: DC

## 2019-05-26 MED ORDER — CHANTIX STARTING MONTH PAK 0.5 MG X 11 & 1 MG X 42 PO TABS: ORAL_TABLET | ORAL | 0 refills | Status: DC

## 2019-05-26 MED ORDER — LISINOPRIL 40 MG PO TABS: 40.0000 mg | ORAL_TABLET | Freq: Every day | ORAL | 0 refills | Status: DC

## 2019-05-26 MED ORDER — PRAVASTATIN SODIUM 20 MG PO TABS: 20.0000 mg | ORAL_TABLET | Freq: Every day | ORAL | 0 refills | Status: DC

## 2019-05-26 MED ORDER — HYDROCODONE-ACETAMINOPHEN 5-325 MG PO TABS: 1.0000 | ORAL_TABLET | Freq: Four times a day (QID) | ORAL | 0 refills | Status: DC | PRN

## 2019-05-26 NOTE — Progress Notes (Signed)
Virtual Visit via telephone Note  I connected with Louis [name] on 05/26/19 at 0925 by telephone and verified that I am speaking with the correct person using two identifiers. [name] is currently located at work and no other people are currently with her during visit. The provider, Fransisca Kaufmann Ambera Fedele, MD is located in their office at time of visit.  Call ended at [place]  I discussed the limitations, risks, security and privacy concerns of performing an evaluation and management service by telephone and the availability of in person appointments. I also discussed with the patient that there may be a patient responsible charge related to this service. The patient expressed understanding and agreed to proceed.   History and Present Illness: Patient has been having problems with hips and especially is looking to do surgery for hips. He is trying to quit smoking so he can have the surgery.  The left one is worse than the right. He is currently smoking 4-5 cigarettes.   No diagnosis found.  Outpatient Encounter Medications as of 05/26/2019  Medication Sig  . diclofenac (VOLTAREN) 75 MG EC tablet TAKE 1 TABLET BY MOUTH TWICE A DAY  . HYDROcodone-acetaminophen (NORCO) 5-325 MG tablet Take 1 tablet by mouth every 6 (six) hours as needed.  Marland Kitchen lisinopril (ZESTRIL) 40 MG tablet TAKE 1 TABLET BY MOUTH EVERY DAY  . pravastatin (PRAVACHOL) 20 MG tablet Take 1 tablet (20 mg total) by mouth daily.  . predniSONE (DELTASONE) 50 MG tablet Take 1 tablet daily.  . Vitamin D, Ergocalciferol, (DRISDOL) 1.25 MG (50000 UT) CAPS capsule Take 1 capsule (50,000 Units total) by mouth every 7 (seven) days.   No facility-administered encounter medications on file as of 05/26/2019.     Review of Systems  Constitutional: Negative for chills and fever.  Respiratory: Negative for shortness of breath and wheezing.   Cardiovascular: Negative for chest pain and leg swelling.  Musculoskeletal: Positive for arthralgias.  Negative for back pain and gait problem.  Skin: Negative for rash.  Neurological: Negative for dizziness, weakness and numbness.  All other systems reviewed and are negative.   Observations/Objective: Patient sounds comfortable and in no acute distress  Assessment and Plan: Problem List Items Addressed This Visit      Musculoskeletal and Integument   Arthritis of right hip   Relevant Medications   celecoxib (CELEBREX) 200 MG capsule   HYDROcodone-acetaminophen (NORCO) 5-325 MG tablet    Other Visit Diagnoses    Encounter for smoking cessation counseling    -  Primary   Relevant Medications   varenicline (CHANTIX CONTINUING MONTH PAK) 1 MG tablet   varenicline (CHANTIX STARTING MONTH PAK) 0.5 MG X 11 & 1 MG X 42 tablet       Follow Up Instructions:  Follow up in 1 month smoking cessation   I discussed the assessment and treatment plan with the patient. The patient was provided an opportunity to ask questions and all were answered. The patient agreed with the plan and demonstrated an understanding of the instructions.   The patient was advised to call back or seek an in-person evaluation if the symptoms worsen or if the condition fails to improve as anticipated.  The above assessment and management plan was discussed with the patient. The patient verbalized understanding of and has agreed to the management plan. Patient is aware to call the clinic if symptoms persist or worsen. Patient is aware when to return to the clinic for a follow-up visit. Patient educated on when  it is appropriate to go to the emergency department.    I provided 15 minutes of non-face-to-face time during this encounter.    Worthy Rancher, MD

## 2019-05-27 ENCOUNTER — Ambulatory Visit: Payer: 59 | Admitting: Family Medicine

## 2019-05-27 ENCOUNTER — Telehealth: Payer: Self-pay | Admitting: *Deleted

## 2019-05-27 NOTE — Telephone Encounter (Signed)
Prior Auth for [employer] Pak-APPROVED  PA Case ID: [id number]   Prior Auth for Chantix Cont pak-APPROVED  [id number]. CHANTIX TAB 1MG  is approved through 05/26/2020

## 2019-05-27 NOTE — Telephone Encounter (Signed)
Prior Auth For Chantix Starting Month Pak 0.5 MG X 11 &1 MG X 42 tablets- In process    (Key: [id number])  [employer] is reviewing your PA request. Typically an electronic response will be received within 72 hours. To check for an update later, open this request from your dashboard.  You may close this dialog and return to your dashboard to perform other tasks.   Prior Auth for Chantix 1MG  tablets-In process  (Key: [id number])  [employer] is reviewing your PA request. Typically an electronic response will be received within 72 hours. To check for an update later, open this request from your dashboard.  You may close this dialog and return to your dashboard to perform other tasks.

## 2019-06-20 ENCOUNTER — Encounter: Payer: Self-pay | Admitting: Family Medicine

## 2019-06-20 ENCOUNTER — Ambulatory Visit (INDEPENDENT_AMBULATORY_CARE_PROVIDER_SITE_OTHER): Payer: 59 | Admitting: Family Medicine

## 2019-06-20 DIAGNOSIS — M1611 Unilateral primary osteoarthritis, right hip: Secondary | ICD-10-CM | POA: Diagnosis not present

## 2019-06-20 DIAGNOSIS — Z716 Tobacco abuse counseling: Secondary | ICD-10-CM

## 2019-06-20 MED ORDER — PREDNISONE 50 MG PO TABS: ORAL_TABLET | ORAL | 0 refills | Status: DC

## 2019-06-20 MED ORDER — HYDROCODONE-ACETAMINOPHEN 5-325 MG PO TABS: 1.0000 | ORAL_TABLET | Freq: Three times a day (TID) | ORAL | 0 refills | Status: DC | PRN

## 2019-06-20 MED ORDER — DICLOFENAC SODIUM 75 MG PO TBEC: 75.0000 mg | DELAYED_RELEASE_TABLET | Freq: Two times a day (BID) | ORAL | 1 refills | Status: DC

## 2019-06-20 NOTE — Progress Notes (Signed)
Virtual Visit via telephone Note  I connected with Louis [name] on 06/20/19 at [phone] by telephone and verified that I am speaking with the correct person using two identifiers. Unkown Defronzo is currently located at home and no other people are currently with her during visit. The provider, Fransisca Kaufmann Lisett Dirusso, MD is located in their office at time of visit.  Call ended at 0850  I discussed the limitations, risks, security and privacy concerns of performing an evaluation and management service by telephone and the availability of in person appointments. I also discussed with the patient that there may be a patient responsible charge related to this service. The patient expressed understanding and agreed to proceed.   History and Present Illness: Patient is calling in for a recheck on chantix and quit 06/13/2019 and has surgery for his hips and is slotted to have surgery in a couple weeks this month.  He is using the hydrocodone but ran out of the celebrex and hydrocodone and the celebrex was too expensive. He said the hydrocodone was helping and he has diclofenac and will switch to that instead of the celebrex.  Patient is calling in for one-time refill on the hydrocodone so that he can get through the surgery, he has bilateral hip surgery coming up this next month and will be managed from the orthopedic on that point.  No diagnosis found.  Outpatient Encounter Medications as of 06/20/2019  Medication Sig  . celecoxib (CELEBREX) 200 MG capsule Take 200 mg by mouth 2 (two) times daily.  Marland Kitchen HYDROcodone-acetaminophen (NORCO) 5-325 MG tablet Take 1 tablet by mouth every 6 (six) hours as needed.  Marland Kitchen lisinopril (ZESTRIL) 40 MG tablet Take 1 tablet (40 mg total) by mouth daily.  . pravastatin (PRAVACHOL) 20 MG tablet Take 1 tablet (20 mg total) by mouth daily.  . predniSONE (DELTASONE) 50 MG tablet Take 1 tablet daily.  . varenicline (CHANTIX CONTINUING MONTH PAK) 1 MG tablet Take 1 tablet (1 mg total)  by mouth 2 (two) times daily.  . varenicline (CHANTIX STARTING MONTH PAK) 0.5 MG X 11 & 1 MG X 42 tablet Take one 0.5 mg tablet by mouth daily for 3 days, then one 0.5 mg tablet twice daily for 4 days, then one 1 mg tablet twice daily.  . Vitamin D, Ergocalciferol, (DRISDOL) 1.25 MG (50000 UT) CAPS capsule Take 1 capsule (50,000 Units total) by mouth every 7 (seven) days.   No facility-administered encounter medications on file as of 06/20/2019.     Review of Systems  Constitutional: Negative for chills and fever.  Respiratory: Negative for shortness of breath and wheezing.   Cardiovascular: Negative for chest pain and leg swelling.  Musculoskeletal: Positive for arthralgias and gait problem. Negative for back pain.  Skin: Negative for rash.  Neurological: Negative for dizziness, weakness and numbness.  All other systems reviewed and are negative.   Observations/Objective: Patient sounds comfortable and in no acute distress  Assessment and Plan: Problem List Items Addressed This Visit      Musculoskeletal and Integument   Arthritis of right hip   Relevant Medications   HYDROcodone-acetaminophen (NORCO) 5-325 MG tablet   predniSONE (DELTASONE) 50 MG tablet   diclofenac (VOLTAREN) 75 MG EC tablet    Other Visit Diagnoses    Encounter for smoking cessation counseling    -  Primary       Follow Up Instructions: Follow up as needed    I discussed the assessment and treatment plan with the  patient. The patient was provided an opportunity to ask questions and all were answered. The patient agreed with the plan and demonstrated an understanding of the instructions.   The patient was advised to call back or seek an in-person evaluation if the symptoms worsen or if the condition fails to improve as anticipated.  The above assessment and management plan was discussed with the patient. The patient verbalized understanding of and has agreed to the management plan. Patient is aware to  call the clinic if symptoms persist or worsen. Patient is aware when to return to the clinic for a follow-up visit. Patient educated on when it is appropriate to go to the emergency department.    I provided 15 minutes of non-face-to-face time during this encounter.    Worthy Rancher, MD

## 2019-07-04 ENCOUNTER — Encounter: Payer: Self-pay | Admitting: Physical Therapy

## 2019-07-04 ENCOUNTER — Other Ambulatory Visit: Payer: Self-pay

## 2019-07-04 ENCOUNTER — Ambulatory Visit: Payer: 59 | Attending: Student | Admitting: Physical Therapy

## 2019-07-04 DIAGNOSIS — M6281 Muscle weakness (generalized): Secondary | ICD-10-CM

## 2019-07-04 DIAGNOSIS — M25552 Pain in left hip: Secondary | ICD-10-CM | POA: Diagnosis present

## 2019-07-04 DIAGNOSIS — R262 Difficulty in walking, not elsewhere classified: Secondary | ICD-10-CM

## 2019-07-04 DIAGNOSIS — M25551 Pain in right hip: Secondary | ICD-10-CM | POA: Diagnosis present

## 2019-07-04 NOTE — Therapy (Signed)
[facility] [place], [place], [address] Phone: [phone]   Fax:  [phone]  Physical Therapy Evaluation  Patient Details  Name: Louis [name] MRN: [id number] Date of Birth: 07-30-1956 Referring Provider (PT): Vela Prose, MD   Encounter Date: 07/04/2019  PT End of Session - 07/04/19 2038    Visit Number  1    Number of Visits  8    Date for PT Re-Evaluation  08/08/19    Authorization Type  FOTO; Progress note at 10th visit    PT Start Time  1300    PT Stop Time  1345    PT Time Calculation (min)  45 min    Equipment Utilized During Treatment  Other (comment)   rolling walker   Activity Tolerance  Patient tolerated treatment well    Behavior During Therapy  [facility] for tasks assessed/performed       Past Medical History:  Diagnosis Date  . DDD (degenerative disc disease), lumbar   . Elevated white blood cell count   . Hyperlipidemia   . Hypertension     Past Surgical History:  Procedure Laterality Date  . TONSILLECTOMY      There were no vitals filed for this visit.   Subjective Assessment - 07/04/19 2026    Subjective  COVID-19 screening performed upon arrival. Patient arrives to physical therapy with reports of bilateral hip pain, difficulty walking, and difficulty performing ADLs due to a bilateral hip replacement on 07/02/2019. Patient requires assistance for all ADLs particularly lower body dressing and getting in/out of the tub for seated showers. Patient reports he ambulates with both a rolling walker and a cane at home depending on his comfort level and pain level. Patient reports pain at worst as 9/10 and pain at best as 3-4/10 with rest, ice and medication. Patient is compliant with HEP provided by MD but only reports performing one time per day. Patient's goals are to decrease pain, improve movement, improve strength, and walk without and AD to return to recreational activities and home activities.     Patient is accompained by:  Family member   wife   Pertinent History  bilateral THA 06/30/2019; HTN    Limitations  Sitting;Standing;Walking;House hold activities    How long can you sit comfortably?  15-20 minutes    How long can you stand comfortably?  10-15 mins with support    How long can you walk comfortably?  room to room    Diagnostic tests  X-ray: primary OA    Patient Stated Goals  decrease pain, walk better    Currently in Pain?  Yes    Pain Score  6     Pain Location  Hip    Pain Orientation  Right;Left    Pain Descriptors / Indicators  Aching;Sore    Pain Type  Surgical pain    Pain Onset  In the past 7 days    Pain Frequency  Constant    Aggravating Factors   movement sitting for too long    Pain Relieving Factors  medicine    Effect of Pain on Daily Activities  increased time to perform activities, needs assistance for ADLs         [facility] PT Assessment - 07/04/19 0001      Assessment   Medical Diagnosis  Primary osteoarthritis of both hips    Referring Provider (PT)  Vela Prose, MD    Onset Date/Surgical Date  06/30/19    Next MD  Visit  07/11/2019    Prior Therapy  no      Precautions   Precautions  Anterior Hip    Precaution Comments  bilateral anterior hip      Balance Screen   Has the patient fallen in the past 6 months  No    Has the patient had a decrease in activity level because of a fear of falling?   No    Is the patient reluctant to leave their home because of a fear of falling?   No      Home Environment   Living Environment  Private residence    Living Arrangements  Spouse/significant other    Type of [place] to enter    Entrance Stairs-Number of Steps  3    Entrance Stairs-Rails  Cannot reach both      Prior Function   Level of Independence  Needs assistance with ADLs;Needs assistance with homemaking;Needs assistance with transfers;Independent with household mobility with device    Comments  assistance  provided by wife      Observation/Other Assessments   Skin Integrity  acquacel dressing on bilateral incisions, to be removed on 07/10/2019    Focus on Therapeutic Outcomes (FOTO)   to be completed next visit      ROM / Strength   AROM / PROM / Strength  AROM      AROM   Overall AROM   Deficits    AROM Assessment Site  Hip    Right/Left Hip  Right;Left    Right Hip Flexion  60    Right Hip External Rotation   15    Right Hip Internal Rotation   5    Right Hip ABduction  11    Left Hip Flexion  50    Left Hip External Rotation   16    Left Hip Internal Rotation   10    Left Hip ABduction  12      Transfers   Transfers  Sit to Supine    Sit to Supine  4: Min assist;With upper extremity assist    Sit to Supine Details   min A for bilateral LEs for on/off plinth    Comments  slow transitional movements for sit to stand      Ambulation/Gait   Assistive device  Rolling walker    Gait Pattern  Step-to pattern;Step-through pattern;Decreased step length - left;Decreased step length - right;Decreased stride length;Decreased hip/knee flexion - right;Decreased hip/knee flexion - left;Antalgic;Trunk flexed                Objective measurements completed on examination: See above findings.              PT Education - 07/04/19 2037    Education Details  glute sets, hamstring sets, LAQ, heel slides    Person(s) Educated  Patient;Spouse    Methods  Explanation;Demonstration;Handout    Comprehension  Verbalized understanding;Returned demonstration       PT Short Term Goals - 07/04/19 2042      PT SHORT TERM GOAL #1   Title  STG=LTG        PT Long Term Goals - 07/04/19 2042      PT LONG TERM GOAL #1   Title  Patient will be independent with HEP and its progresssion.    Time  4    Period  Weeks    Status  New  PT LONG TERM GOAL #2   Title  Patient will demonstrate 115+ degrees of bilateral hip flexion for ease of lower body dressing.    Time  4     Period  Weeks    Status  New      PT LONG TERM GOAL #3   Title  Patient will demonstrate 4+/5 or greater bilateral hip and knee MMT in all planes to improve stability during functional tasks.    Time  4    Period  Weeks    Status  New      PT LONG TERM GOAL #4   Title  Patient will report ability to perform ADLs independently with bilateral hip pain less than or equal to 2/10.    Time  4    Period  Weeks    Status  New      PT LONG TERM GOAL #5   Title  Patient will negotiate 3 steps with reciporcating gait pattern with LRAD or no AD and no railing to safely enter and exit home.    Time  4    Period  Weeks    Status  New             Plan - 07/04/19 2039    Clinical Impression Statement  Patient is a 63 year old male who presents to physical therapy with his wife with bilateral hip ROM, decreased bilateral hip MMT, and difficulty walking due to a bilateral hip arthroplasty on 06/30/2019.  Patient required min A at bilateral LEs for transitions from sit to supine. Patient ambulates with a rolling walker with varying step to and step through, antalgic gait pattern with decreased bilateral step length, and decreased knee and hip flexion during swing. Patient noted with aquacel dressing on incisions and is to remove on 07/10/2019. Patient, patient's wife and PT discussed HEP as well as importance of performing to improve ROM and strength. Patient reported understanding. Patient would benefit from skilled physical therapy to address deficits and address patient's goals.    Personal Factors and Comorbidities  Age;Comorbidity 1    Comorbidities  HTN; bilateral THA 06/30/2019    Examination-Activity Limitations  Bathing;Bed Mobility;Locomotion Level;Dressing;Hygiene/Grooming;Toileting;Stand;Stairs;Squat;Sit;Transfers    Examination-Participation Restrictions  Cleaning;Meal Prep;Yard Work    Stability/Clinical Decision Making  Stable/Uncomplicated    Clinical Decision Making  Low    Rehab  Potential  Good    PT Frequency  2x / week    PT Duration  4 weeks    PT Treatment/Interventions  ADLs/Self Care Home Management;Cryotherapy;Electrical Stimulation;Moist Heat;Gait training;Stair training;Functional mobility training;Therapeutic activities;Therapeutic exercise;Balance training;Passive range of motion;Patient/family education;Neuromuscular re-education;Manual techniques    PT Next Visit Plan  FOTO please (already set up); Nustep; ROM of bilateral hips, standing and supine exercises, modalities PRN for pain relief    PT Home Exercise Plan  see patient education sectoin    Consulted and Agree with Plan of Care  Patient       Patient will benefit from skilled therapeutic intervention in order to improve the following deficits and impairments:  Decreased activity tolerance, Decreased balance, Decreased mobility, Decreased strength, Decreased range of motion, Difficulty walking, Pain  Visit Diagnosis: Pain in right hip - Plan: PT plan of care cert/re-cert  Pain in left hip - Plan: PT plan of care cert/re-cert  Muscle weakness (generalized) - Plan: PT plan of care cert/re-cert  Difficulty in walking, not elsewhere classified - Plan: PT plan of care cert/re-cert     Problem List Patient Active  Problem List   Diagnosis Date Noted  . Pain management contract signed 12/14/2018  . Greater trochanteric bursitis of both hips 06/26/2017  . Arthritis of right hip 06/02/2017  . Degenerative disc disease, lumbar 06/02/2017  . Piriformis syndrome of left side 09/03/2015  . HLD (hyperlipidemia) 09/03/2015  . HTN (hypertension) 09/03/2015  . Onychodystrophy 09/03/2015    Gabriela Eves, PT, DPT 07/04/2019, 9:05 PM  [facility] [address] [place], [place], [address] Phone: [phone]   Fax:  [phone]  Name: Louis [name] MRN: [id number] Date of Birth: 08-27-1956

## 2019-07-07 ENCOUNTER — Other Ambulatory Visit: Payer: Self-pay

## 2019-07-07 ENCOUNTER — Ambulatory Visit: Payer: 59 | Admitting: *Deleted

## 2019-07-07 DIAGNOSIS — M6281 Muscle weakness (generalized): Secondary | ICD-10-CM

## 2019-07-07 DIAGNOSIS — M25551 Pain in right hip: Secondary | ICD-10-CM | POA: Diagnosis not present

## 2019-07-07 DIAGNOSIS — M25552 Pain in left hip: Secondary | ICD-10-CM

## 2019-07-07 DIAGNOSIS — R262 Difficulty in walking, not elsewhere classified: Secondary | ICD-10-CM

## 2019-07-07 NOTE — Therapy (Signed)
[facility] [place], [place], [address] Phone: [phone]   Fax:  [phone]  Physical Therapy Treatment  Patient Details  Name: Louis [name] MRN: [id number] Date of Birth: 09-23-1955 Referring Provider (PT): Vela Prose, MD   Encounter Date: 07/07/2019  PT End of Session - 07/07/19 1322    Visit Number  2    Number of Visits  8    Date for PT Re-Evaluation  08/08/19    Authorization Type  FOTO; Progress note at 10th visit    PT Start Time  1300    PT Stop Time  1348    PT Time Calculation (min)  48 min       Past Medical History:  Diagnosis Date  . DDD (degenerative disc disease), lumbar   . Elevated white blood cell count   . Hyperlipidemia   . Hypertension     Past Surgical History:  Procedure Laterality Date  . TONSILLECTOMY      There were no vitals filed for this visit.  Subjective Assessment - 07/07/19 1743    Subjective  COVID19 screening performed prior to entering the building. Using [facility] today. Pain 4/10.    Patient is accompained by:  Family member    Pertinent History  bilateral THA 06/30/2019; HTN    Limitations  Sitting;Standing;Walking;House hold activities    How long can you sit comfortably?  15-20 minutes    How long can you stand comfortably?  10-15 mins with support    How long can you walk comfortably?  room to room    Diagnostic tests  X-ray: primary OA    Patient Stated Goals  decrease pain, walk better    Currently in Pain?  Yes    Pain Score  4     Pain Location  Hip    Pain Orientation  Right;Left    Pain Descriptors / Indicators  Aching;Sore    Pain Type  Surgical pain    Pain Onset  In the past 7 days                       [facility] Adult PT Treatment/Exercise - 07/07/19 0001      Exercises   Exercises  Knee/Hip      Knee/Hip Exercises: Aerobic   Nustep  seat 15, x15 mins L4      Knee/Hip Exercises: Standing   Heel Raises  Both;2 sets;15 reps    Knee Flexion  Both;2 sets;10 reps    Hip Flexion  AROM;Both;1 set;10 reps   low level   Hip Abduction  Both;2 sets;10 reps      Knee/Hip Exercises: Seated   Hamstring Curl  Both;1 set;10 reps   red tband     Manual Therapy   Manual Therapy  Passive ROM    Passive ROM  PROM to both LEs for hip flexion               PT Short Term Goals - 07/04/19 2042      PT SHORT TERM GOAL #1   Title  STG=LTG        PT Long Term Goals - 07/04/19 2042      PT LONG TERM GOAL #1   Title  Patient will be independent with HEP and its progresssion.    Time  4    Period  Weeks    Status  New      PT LONG TERM GOAL #2  Title  Patient will demonstrate 115+ degrees of bilateral hip flexion for ease of lower body dressing.    Time  4    Period  Weeks    Status  New      PT LONG TERM GOAL #3   Title  Patient will demonstrate 4+/5 or greater bilateral hip and knee MMT in all planes to improve stability during functional tasks.    Time  4    Period  Weeks    Status  New      PT LONG TERM GOAL #4   Title  Patient will report ability to perform ADLs independently with bilateral hip pain less than or equal to 2/10.    Time  4    Period  Weeks    Status  New      PT LONG TERM GOAL #5   Title  Patient will negotiate 3 steps with reciporcating gait pattern with LRAD or no AD and no railing to safely enter and exit home.    Time  4    Period  Weeks    Status  New            Plan - 07/07/19 1323    Clinical Impression Statement  Pt arrived today doing fairly well 7 days post-op. He was able to ambulate with a SPC today with increased step length and did well. He was able to perform some AAROM exs in //bars with low level marching being the most challenging. PROM was performed to Bil. LE's in supine with VC's to relax to decrease mm guarding.    Comorbidities  HTN; bilateral THA 06/30/2019    Examination-Activity Limitations  Bathing;Bed Mobility;Locomotion  Level;Dressing;Hygiene/Grooming;Toileting;Stand;Stairs;Squat;Sit;Transfers    Stability/Clinical Decision Making  Stable/Uncomplicated    Rehab Potential  Good    PT Frequency  2x / week    PT Duration  4 weeks    PT Treatment/Interventions  ADLs/Self Care Home Management;Cryotherapy;Electrical Stimulation;Moist Heat;Gait training;Stair training;Functional mobility training;Therapeutic activities;Therapeutic exercise;Balance training;Passive range of motion;Patient/family education;Neuromuscular re-education;Manual techniques    PT Next Visit Plan  FOTO please (already set up); Nustep; ROM of bilateral hips, standing and supine exercises, modalities PRN for pain relief    PT Home Exercise Plan  see patient education sectoin    Consulted and Agree with Plan of Care  Patient       Patient will benefit from skilled therapeutic intervention in order to improve the following deficits and impairments:  Decreased activity tolerance, Decreased balance, Decreased mobility, Decreased strength, Decreased range of motion, Difficulty walking, Pain  Visit Diagnosis: Pain in right hip  Pain in left hip  Muscle weakness (generalized)  Difficulty in walking, not elsewhere classified     Problem List Patient Active Problem List   Diagnosis Date Noted  . Pain management contract signed 12/14/2018  . Greater trochanteric bursitis of both hips 06/26/2017  . Arthritis of right hip 06/02/2017  . Degenerative disc disease, lumbar 06/02/2017  . Piriformis syndrome of left side 09/03/2015  . HLD (hyperlipidemia) 09/03/2015  . HTN (hypertension) 09/03/2015  . Onychodystrophy 09/03/2015    Amrutha Avera,CHRIS, PTA 07/07/2019, 5:55 PM  [facility] [address] [place], [place], [address] Phone: [phone]   Fax:  [phone]  Name: Leomar Brackens MRN: [id number] Date of Birth: 1956-06-23

## 2019-07-11 ENCOUNTER — Telehealth: Payer: Self-pay | Admitting: Family Medicine

## 2019-07-11 NOTE — Telephone Encounter (Signed)
Apt scheduled.  

## 2019-07-11 NOTE — Telephone Encounter (Signed)
Nothing in your schedule until December 10th. Would it be ok to schedule this on another day such as walk in or in a acute spot?

## 2019-07-12 ENCOUNTER — Other Ambulatory Visit: Payer: Self-pay

## 2019-07-12 ENCOUNTER — Ambulatory Visit: Payer: 59 | Admitting: *Deleted

## 2019-07-12 DIAGNOSIS — M6281 Muscle weakness (generalized): Secondary | ICD-10-CM

## 2019-07-12 DIAGNOSIS — M25551 Pain in right hip: Secondary | ICD-10-CM | POA: Diagnosis not present

## 2019-07-12 DIAGNOSIS — M25552 Pain in left hip: Secondary | ICD-10-CM

## 2019-07-12 NOTE — Therapy (Signed)
[facility] [place], [place], [address] Phone: [phone]   Fax:  [phone]  Physical Therapy Treatment  Patient Details  Name: Louis [name] MRN: [id number] Date of Birth: [birth year] Referring Provider (PT): Vela Prose, MD   Encounter Date: 07/12/2019  PT End of Session - 07/12/19 1402    Visit Number  3    Number of Visits  9    Date for PT Re-Evaluation  08/08/19    Authorization Type  FOTO; Progress note at 10th visit    PT Start Time  1300    PT Stop Time  1350    PT Time Calculation (min)  50 min       Past Medical History:  Diagnosis Date  . DDD (degenerative disc disease), lumbar   . Elevated white blood cell count   . Hyperlipidemia   . Hypertension     Past Surgical History:  Procedure Laterality Date  . TONSILLECTOMY      There were no vitals filed for this visit.  Subjective Assessment - 07/12/19 1318    Subjective  COVID19 screening performed prior to entering the building. Using [facility] today. Pain 3-4/10. A lot of walking yesterday    Pertinent History  bilateral THA 06/30/2019; HTN    How long can you sit comfortably?  15-20 minutes    How long can you stand comfortably?  10-15 mins with support    Diagnostic tests  X-ray: primary OA    Patient Stated Goals  decrease pain, walk better    Currently in Pain?  Yes    Pain Score  4     Pain Location  Hip    Pain Orientation  Right;Left    Pain Descriptors / Indicators  Aching;Sore    Pain Type  Surgical pain    Pain Onset  In the past 7 days                       [facility] Adult PT Treatment/Exercise - 07/12/19 0001      Exercises   Exercises  Knee/Hip      Knee/Hip Exercises: Aerobic   Nustep  seat 15,13,12, x15 mins L4      Knee/Hip Exercises: Standing   Heel Raises  Both;2 sets;15 reps    Knee Flexion  Both;2 sets;10 reps    Hip Flexion  2 sets;10 reps;AROM    Hip Abduction  Both;10 reps;3 sets    Forward Step  Up  Both;3 sets;10 reps;Step Height: 4";Hand Hold: 0;Hand Hold: 1    Rocker Board  3 minutes      Knee/Hip Exercises: Seated   Hamstring Curl  Both;10 reps;2 sets   red tband     Manual Therapy   Manual Therapy  Passive ROM    Passive ROM  PROM to both LEs for hip flexion               PT Short Term Goals - 07/04/19 2042      PT SHORT TERM GOAL #1   Title  STG=LTG        PT Long Term Goals - 07/04/19 2042      PT LONG TERM GOAL #1   Title  Patient will be independent with HEP and its progresssion.    Time  4    Period  Weeks    Status  New      PT LONG TERM GOAL #2   Title  Patient will  demonstrate 115+ degrees of bilateral hip flexion for ease of lower body dressing.    Time  4    Period  Weeks    Status  New      PT LONG TERM GOAL #3   Title  Patient will demonstrate 4+/5 or greater bilateral hip and knee MMT in all planes to improve stability during functional tasks.    Time  4    Period  Weeks    Status  New      PT LONG TERM GOAL #4   Title  Patient will report ability to perform ADLs independently with bilateral hip pain less than or equal to 2/10.    Time  4    Period  Weeks    Status  New      PT LONG TERM GOAL #5   Title  Patient will negotiate 3 steps with reciporcating gait pattern with LRAD or no AD and no railing to safely enter and exit home.    Time  4    Period  Weeks    Status  New            Plan - 07/12/19 1403    Clinical Impression Statement  Pt arrived today doing fairly well again and ambulating without AD. He was able to continue  and progress with therex and balance act.'s. Pt able to perform step ups on 4" step and mini-squats without UE assist and mainly fatigue end of Rx. Gentle PROM for flexion both hips to 90 degrees.    Personal Factors and Comorbidities  Age;Comorbidity 1    Comorbidities  HTN; bilateral THA 06/30/2019    Examination-Activity Limitations  Bathing;Bed Mobility;Locomotion  Level;Dressing;Hygiene/Grooming;Toileting;Stand;Stairs;Squat;Sit;Transfers    Examination-Participation Restrictions  Cleaning;Meal Prep;Yard Work    Stability/Clinical Decision Making  Stable/Uncomplicated    Rehab Potential  Good    PT Frequency  2x / week    PT Duration  4 weeks    PT Treatment/Interventions  ADLs/Self Care Home Management;Cryotherapy;Electrical Stimulation;Moist Heat;Gait training;Stair training;Functional mobility training;Therapeutic activities;Therapeutic exercise;Balance training;Passive range of motion;Patient/family education;Neuromuscular re-education;Manual techniques    PT Next Visit Plan  FOTO please (already set up); Nustep; ROM of bilateral hips, standing and supine exercises, modalities PRN for pain relief    PT Home Exercise Plan  see patient education sectoin    Consulted and Agree with Plan of Care  Patient       Patient will benefit from skilled therapeutic intervention in order to improve the following deficits and impairments:  Decreased activity tolerance, Decreased balance, Decreased mobility, Decreased strength, Decreased range of motion, Difficulty walking, Pain  Visit Diagnosis: Pain in right hip  Pain in left hip  Muscle weakness (generalized)     Problem List Patient Active Problem List   Diagnosis Date Noted  . Pain management contract signed 12/14/2018  . Greater trochanteric bursitis of both hips 06/26/2017  . Arthritis of right hip 06/02/2017  . Degenerative disc disease, lumbar 06/02/2017  . Piriformis syndrome of left side 09/03/2015  . HLD (hyperlipidemia) 09/03/2015  . HTN (hypertension) 09/03/2015  . Onychodystrophy 09/03/2015    Nathalya Wolanski,CHRIS, PTA 07/12/2019, 2:14 PM  [facility] [place], [place], [address] Phone: [phone]   Fax:  [phone]  Name: Louis [name] MRN: [id number] Date of Birth: Jun 26, 1956

## 2019-07-14 ENCOUNTER — Ambulatory Visit: Payer: 59 | Admitting: *Deleted

## 2019-07-14 ENCOUNTER — Other Ambulatory Visit: Payer: Self-pay

## 2019-07-14 DIAGNOSIS — M25551 Pain in right hip: Secondary | ICD-10-CM | POA: Diagnosis not present

## 2019-07-14 DIAGNOSIS — M25552 Pain in left hip: Secondary | ICD-10-CM

## 2019-07-14 DIAGNOSIS — R262 Difficulty in walking, not elsewhere classified: Secondary | ICD-10-CM

## 2019-07-14 DIAGNOSIS — M6281 Muscle weakness (generalized): Secondary | ICD-10-CM

## 2019-07-14 NOTE — Therapy (Signed)
[facility] [place], [place], [address] Phone: [phone]   Fax:  [phone]  Physical Therapy Treatment  Patient Details  Name: Louis [name] MRN: [id number] Date of Birth: Mar 08, 1956 Referring Provider (PT): Vela Prose, MD   Encounter Date: 07/14/2019  PT End of Session - 07/14/19 1321    Visit Number  4    Number of Visits  8    Authorization Type  FOTO; Progress note at 10th visit    PT Start Time  1300    PT Stop Time  1348    PT Time Calculation (min)  48 min       Past Medical History:  Diagnosis Date  . DDD (degenerative disc disease), lumbar   . Elevated white blood cell count   . Hyperlipidemia   . Hypertension     Past Surgical History:  Procedure Laterality Date  . TONSILLECTOMY      There were no vitals filed for this visit.  Subjective Assessment - 07/14/19 1314    Subjective  COVID19 screening performed prior to entering the building. Using [facility] today. Pain  5/10. A lot of walking yesterday. Sore from stretching    Pertinent History  bilateral THA 06/30/2019; HTN    Limitations  Sitting;Standing;Walking;House hold activities    How long can you sit comfortably?  15-20 minutes    How long can you stand comfortably?  10-15 mins with support    How long can you walk comfortably?  room to room    Diagnostic tests  X-ray: primary OA    Patient Stated Goals  decrease pain, walk better    Currently in Pain?  Yes    Pain Score  5     Pain Location  Hip    Pain Orientation  Right;Left    Pain Descriptors / Indicators  Aching;Sore    Pain Onset  In the past 7 days                       [facility] Adult PT Treatment/Exercise - 07/14/19 0001      Exercises   Exercises  Knee/Hip      Knee/Hip Exercises: Aerobic   Nustep  seat 15,13,12, x20 mins L4      Knee/Hip Exercises: Standing   Heel Raises  --    Knee Flexion  Both;10 reps;3 sets    Hip Flexion  10 reps;AROM;3 sets    Hip Abduction  Both;10 reps;3 sets    Forward Step Up  Both;3 sets;10 reps;Step Height: 4";Hand Hold: 0;Hand Hold: 1    Rocker Board  5 minutes   3 mins F/B and 2 mins S/S     Knee/Hip Exercises: Seated   Long Arc Quad  Strengthening;Both;3 sets;10 reps    Long Arc Quad Weight  3 lbs.    Sit to [employer]  10 reps               PT Short Term Goals - 07/04/19 2042      PT SHORT TERM GOAL #1   Title  STG=LTG        PT Long Term Goals - 07/04/19 2042      PT LONG TERM GOAL #1   Title  Patient will be independent with HEP and its progresssion.    Time  4    Period  Weeks    Status  New      PT LONG TERM GOAL #2   Title  Patient will demonstrate 115+ degrees of bilateral hip flexion for ease of lower body dressing.    Time  4    Period  Weeks    Status  New      PT LONG TERM GOAL #3   Title  Patient will demonstrate 4+/5 or greater bilateral hip and knee MMT in all planes to improve stability during functional tasks.    Time  4    Period  Weeks    Status  New      PT LONG TERM GOAL #4   Title  Patient will report ability to perform ADLs independently with bilateral hip pain less than or equal to 2/10.    Time  4    Period  Weeks    Status  New      PT LONG TERM GOAL #5   Title  Patient will negotiate 3 steps with reciporcating gait pattern with LRAD or no AD and no railing to safely enter and exit home.    Time  4    Period  Weeks    Status  New            Plan - 07/14/19 1318    Clinical Impression Statement  Pt arrived today doing fairly well except for lots of soreness in both hips. He was able to complete all exs  and balance act.'s today with progression. No PROM today as per Pt. due to soreness. No LTGs met yet due to deficits.    Personal Factors and Comorbidities  Age;Comorbidity 1    Comorbidities  HTN; bilateral THA 06/30/2019    Examination-Activity Limitations  Bathing;Bed Mobility;Locomotion  Level;Dressing;Hygiene/Grooming;Toileting;Stand;Stairs;Squat;Sit;Transfers    Stability/Clinical Decision Making  Stable/Uncomplicated    Rehab Potential  Good    PT Frequency  2x / week    PT Duration  4 weeks    PT Treatment/Interventions  ADLs/Self Care Home Management;Cryotherapy;Electrical Stimulation;Moist Heat;Gait training;Stair training;Functional mobility training;Therapeutic activities;Therapeutic exercise;Balance training;Passive range of motion;Patient/family education;Neuromuscular re-education;Manual techniques    PT Next Visit Plan  ; Nustep; ROM of bilateral hips, standing and supine exercises, modalities PRN for pain relief    PT Home Exercise Plan  see patient education sectoin    Consulted and Agree with Plan of Care  Patient       Patient will benefit from skilled therapeutic intervention in order to improve the following deficits and impairments:  Decreased activity tolerance, Decreased balance, Decreased mobility, Decreased strength, Decreased range of motion, Difficulty walking, Pain  Visit Diagnosis: Pain in right hip  Pain in left hip  Muscle weakness (generalized)  Difficulty in walking, not elsewhere classified     Problem List Patient Active Problem List   Diagnosis Date Noted  . Pain management contract signed 12/14/2018  . Greater trochanteric bursitis of both hips 06/26/2017  . Arthritis of right hip 06/02/2017  . Degenerative disc disease, lumbar 06/02/2017  . Piriformis syndrome of left side 09/03/2015  . HLD (hyperlipidemia) 09/03/2015  . HTN (hypertension) 09/03/2015  . Onychodystrophy 09/03/2015    ,CHRIS, PTA 07/14/2019, 2:09 PM  [facility] [address] [place], [place], [address] Phone: [phone]   Fax:  [phone]  Name: Louis [name] MRN: [id number] Date of Birth: 08-23-1956

## 2019-07-18 DIAGNOSIS — Z471 Aftercare following joint replacement surgery: Secondary | ICD-10-CM | POA: Insufficient documentation

## 2019-07-19 ENCOUNTER — Ambulatory Visit: Payer: 59 | Attending: Student | Admitting: Physical Therapy

## 2019-07-19 ENCOUNTER — Encounter: Payer: Self-pay | Admitting: Physical Therapy

## 2019-07-19 ENCOUNTER — Other Ambulatory Visit: Payer: Self-pay

## 2019-07-19 DIAGNOSIS — M6281 Muscle weakness (generalized): Secondary | ICD-10-CM | POA: Diagnosis present

## 2019-07-19 DIAGNOSIS — R262 Difficulty in walking, not elsewhere classified: Secondary | ICD-10-CM | POA: Insufficient documentation

## 2019-07-19 DIAGNOSIS — M25552 Pain in left hip: Secondary | ICD-10-CM | POA: Diagnosis present

## 2019-07-19 DIAGNOSIS — M25551 Pain in right hip: Secondary | ICD-10-CM | POA: Diagnosis not present

## 2019-07-19 NOTE — Therapy (Signed)
[facility] [place], [place], [address] Phone: [phone]   Fax:  [phone]  Physical Therapy Treatment  Patient Details  Name: Louis [name] MRN: [id number] Date of Birth: 1956/06/26  Referring Provider (PT): Vela Prose, MD   Encounter Date: 07/19/2019  PT End of Session - 07/19/19 1359    Visit Number  5    Number of Visits  8    Date for PT Re-Evaluation  08/08/19    Authorization Type  FOTO; Progress note at 10th visit    PT Start Time  1300    PT Stop Time  1349    PT Time Calculation (min)  49 min    Activity Tolerance  Patient tolerated treatment well    Behavior During Therapy  [facility] for tasks assessed/performed       Past Medical History:  Diagnosis Date  . DDD (degenerative disc disease), lumbar   . Elevated white blood cell count   . Hyperlipidemia   . Hypertension     Past Surgical History:  Procedure Laterality Date  . TONSILLECTOMY      There were no vitals filed for this visit.  Subjective Assessment - 07/19/19 1359    Subjective  COVID19 screening performed prior to entering the building. Patient reports feeling very good today with no reports of pain.    Patient is accompained by:  Family member    Pertinent History  bilateral THA 06/30/2019; HTN    Limitations  Sitting;Standing;Walking;House hold activities    How long can you sit comfortably?  15-20 minutes    How long can you stand comfortably?  10-15 mins with support    How long can you walk comfortably?  room to room    Diagnostic tests  X-ray: primary OA    Patient Stated Goals  decrease pain, walk better    Currently in Pain?  No/denies         [facility] PT Assessment - 07/19/19 0001      Assessment   Medical Diagnosis  Primary osteoarthritis of both hips    Referring Provider (PT)  Vela Prose, MD    Onset Date/Surgical Date  06/30/19    Next MD Visit  08/15/2019    Prior Therapy  no      Precautions    Precautions  Anterior Hip    Precaution Comments  bilateral anterior hip                   [facility] Adult PT Treatment/Exercise - 07/19/19 0001      Exercises   Exercises  Knee/Hip      Knee/Hip Exercises: Aerobic   Stationary Bike  Level 3 x16 mins seat 11      Knee/Hip Exercises: Standing   Heel Raises  Both;3 sets;10 reps    Heel Raises Limitations  followed by toe raises 3x10    Knee Flexion  Both;10 reps;3 sets    Hip Flexion  10 reps;AROM;3 sets    Hip Abduction  Both;10 reps;3 sets    Forward Step Up  Both;10 reps;Step Height: 6";Hand Hold: 2;2 Systems analyst  Other (comment)   6 mins total (23mins AP/ 3 mins lateral)   Other Standing Knee Exercises  lateral stepping parallel bars red theraband x2 minutes               PT Short Term Goals - 07/04/19 2042      PT SHORT TERM GOAL #1  Title  STG=LTG        PT Long Term Goals - 07/19/19 1348      PT LONG TERM GOAL #1   Title  Patient will be independent with HEP and its progresssion.    Time  4    Period  Weeks    Status  On-going      PT LONG TERM GOAL #2   Title  Patient will demonstrate 115+ degrees of bilateral hip flexion for ease of lower body dressing.    Time  4    Period  Weeks    Status  On-going      PT LONG TERM GOAL #3   Title  Patient will demonstrate 4+/5 or greater bilateral hip and knee MMT in all planes to improve stability during functional tasks.    Time  4    Period  Weeks      PT LONG TERM GOAL #4   Title  Patient will report ability to perform ADLs independently with bilateral hip pain less than or equal to 2/10.    Time  4    Period  Weeks    Status  On-going      PT LONG TERM GOAL #5   Title  Patient will negotiate 3 steps with reciporcating gait pattern with LRAD or no AD and no railing to safely enter and exit home.    Time  4    Period  Weeks    Status  On-going            Plan - 07/19/19 1355    Clinical Impression Statement  Patient arrives  to physical therapy with no reports of pain in bilateral hips. Patient was able to tolerate all therapeutic exercises but with slight increase of discomfort L>R. Patient is compliant with HEP. Patient provided with progression of HEP and was educated to continue inital HEP when hips feel stiff to maintain mobility. Patient reported understanding. Patient's goals are ongoing at this time. Patient educated we will continue remaining visits then assess if more visits to address goals are warranted. Patient reported understanding.    Personal Factors and Comorbidities  Age;Comorbidity 1    Comorbidities  HTN; bilateral THA 06/30/2019    Examination-Activity Limitations  Bathing;Bed Mobility;Locomotion Level;Dressing;Hygiene/Grooming;Toileting;Stand;Stairs;Squat;Sit;Transfers    Examination-Participation Restrictions  Cleaning;Meal Prep;Yard Work    Stability/Clinical Decision Making  Stable/Uncomplicated    Clinical Decision Making  Low    Rehab Potential  Good    PT Frequency  2x / week    PT Duration  4 weeks    PT Treatment/Interventions  ADLs/Self Care Home Management;Cryotherapy;Electrical Stimulation;Moist Heat;Gait training;Stair training;Functional mobility training;Therapeutic activities;Therapeutic exercise;Balance training;Passive range of motion;Patient/family education;Neuromuscular re-education;Manual techniques    PT Next Visit Plan  ; Nustep; ROM of bilateral hips, standing and supine exercises, modalities PRN for pain relief    PT Home Exercise Plan  see patient education section    Consulted and Agree with Plan of Care  Patient       Patient will benefit from skilled therapeutic intervention in order to improve the following deficits and impairments:  Decreased activity tolerance, Decreased balance, Decreased mobility, Decreased strength, Decreased range of motion, Difficulty walking, Pain  Visit Diagnosis: Pain in right hip  Pain in left hip  Muscle weakness  (generalized)  Difficulty in walking, not elsewhere classified     Problem List Patient Active Problem List   Diagnosis Date Noted  . Pain management contract signed 12/14/2018  . Greater trochanteric  bursitis of both hips 06/26/2017  . Arthritis of right hip 06/02/2017  . Degenerative disc disease, lumbar 06/02/2017  . Piriformis syndrome of left side 09/03/2015  . HLD (hyperlipidemia) 09/03/2015  . HTN (hypertension) 09/03/2015  . Onychodystrophy 09/03/2015    Gabriela Eves, PT, DPT 07/19/2019, 2:11 PM  [facility] [address] [place], [place], [address] Phone: [phone]   Fax:  [phone]  Name: Louis [name] MRN: [id number] Date of Birth: Nov 09, 1955

## 2019-07-21 ENCOUNTER — Other Ambulatory Visit: Payer: Self-pay

## 2019-07-21 ENCOUNTER — Encounter: Payer: Self-pay | Admitting: *Deleted

## 2019-07-21 ENCOUNTER — Ambulatory Visit: Payer: 59 | Admitting: *Deleted

## 2019-07-21 DIAGNOSIS — M6281 Muscle weakness (generalized): Secondary | ICD-10-CM

## 2019-07-21 DIAGNOSIS — M25551 Pain in right hip: Secondary | ICD-10-CM | POA: Diagnosis not present

## 2019-07-21 DIAGNOSIS — R262 Difficulty in walking, not elsewhere classified: Secondary | ICD-10-CM

## 2019-07-21 DIAGNOSIS — M25552 Pain in left hip: Secondary | ICD-10-CM

## 2019-07-21 NOTE — Therapy (Signed)
[facility] [place], [place], [address] Phone: [phone]   Fax:  [phone]  Physical Therapy Treatment  Patient Details  Name: Louis [name] MRN: [id number] Date of Birth: [birth year] Referring Provider (PT): Vela Prose, MD   Encounter Date: 07/21/2019  PT End of Session - 07/21/19 1306    Visit Number  6    Number of Visits  8    Date for PT Re-Evaluation  08/08/19    Authorization Type  FOTO; Progress note at 10th visit    PT Start Time  1300    PT Stop Time  1350    PT Time Calculation (min)  50 min       Past Medical History:  Diagnosis Date  . DDD (degenerative disc disease), lumbar   . Elevated white blood cell count   . Hyperlipidemia   . Hypertension     Past Surgical History:  Procedure Laterality Date  . TONSILLECTOMY      There were no vitals filed for this visit.  Subjective Assessment - 07/21/19 1320    Subjective  COVID19 screening performed prior to entering the building. Patient reports feeling very good today with mainly soreness both hips. MD 08-15-19    Pertinent History  bilateral THA 06/30/2019; HTN    Limitations  Sitting;Standing;Walking;House hold activities    How long can you sit comfortably?  15-20 minutes    How long can you stand comfortably?  10-15 mins with support    How long can you walk comfortably?  room to room    Diagnostic tests  X-ray: primary OA    Patient Stated Goals  decrease pain, walk better    Currently in Pain?  No/denies    Pain Location  Hip    Pain Orientation  Right;Left    Pain Onset  In the past 7 days                       [facility] Adult PT Treatment/Exercise - 07/21/19 0001      Exercises   Exercises  Knee/Hip      Knee/Hip Exercises: Aerobic   Stationary Bike  Level 3 x16 mins seat 11      Knee/Hip Exercises: Standing   Heel Raises  Both;3 sets;10 reps    Heel Raises Limitations  followed by toe raises 3x10    Knee  Flexion  Both;10 reps;3 sets    Knee Flexion Limitations  2#    Hip Flexion  10 reps;AROM;3 sets    Hip Abduction  Both;10 reps;3 sets    Abduction Limitations  2#    Forward Step Up  Both;10 reps;Step Height: 6";Hand Hold: 2;2 sets;Hand Hold: 0    Rocker Board  Other (comment)   6 mins total (16mins AP/ 3 mins lateral) balance   Other Standing Knee Exercises  lateral stepping parallel bars green tube/velrco x2 minutes               PT Short Term Goals - 07/04/19 2042      PT SHORT TERM GOAL #1   Title  STG=LTG        PT Long Term Goals - 07/21/19 1349      PT LONG TERM GOAL #5   Title  Patient will negotiate 3 steps with reciporcating gait pattern with LRAD or no AD and no railing to safely enter and exit home.    Time  4    Period  Weeks  Plan - 07/21/19 1319    Clinical Impression Statement  Pt arrived today doing fairly well with both hips and has mainly soreness. patient was able to progress strengthening today for both hips. His PROM for Both hipsare doing better and are at 110 degrees. He was able to perform steps now with reciprocating  pattern and meeting LTG.    Personal Factors and Comorbidities  Age;Comorbidity 1    Comorbidities  HTN; bilateral THA 06/30/2019    Examination-Activity Limitations  Bathing;Bed Mobility;Locomotion Level;Dressing;Hygiene/Grooming;Toileting;Stand;Stairs;Squat;Sit;Transfers    Examination-Participation Restrictions  Cleaning;Meal Prep;Yard Work    Stability/Clinical Decision Making  Stable/Uncomplicated    Rehab Potential  Good    PT Frequency  2x / week    PT Duration  4 weeks    PT Treatment/Interventions  ADLs/Self Care Home Management;Cryotherapy;Electrical Stimulation;Moist Heat;Gait training;Stair training;Functional mobility training;Therapeutic activities;Therapeutic exercise;Balance training;Passive range of motion;Patient/family education;Neuromuscular re-education;Manual techniques    PT Next Visit Plan   ; Nustep; ROM of bilateral hips, standing and supine exercises, modalities PRN for pain relief    PT Home Exercise Plan  see patient education section    Consulted and Agree with Plan of Care  Patient       Patient will benefit from skilled therapeutic intervention in order to improve the following deficits and impairments:  Decreased activity tolerance, Decreased balance, Decreased mobility, Decreased strength, Decreased range of motion, Difficulty walking, Pain  Visit Diagnosis: Pain in right hip  Pain in left hip  Muscle weakness (generalized)  Difficulty in walking, not elsewhere classified     Problem List Patient Active Problem List   Diagnosis Date Noted  . Pain management contract signed 12/14/2018  . Greater trochanteric bursitis of both hips 06/26/2017  . Arthritis of right hip 06/02/2017  . Degenerative disc disease, lumbar 06/02/2017  . Piriformis syndrome of left side 09/03/2015  . HLD (hyperlipidemia) 09/03/2015  . HTN (hypertension) 09/03/2015  . Onychodystrophy 09/03/2015    Shamera Yarberry,CHRIS, PTA 07/21/2019, 2:06 PM  [facility] [place], [place], [address] Phone: [phone]   Fax:  [phone]  Name: Harris Capellan MRN: [id number] Date of Birth: 05-16-1956

## 2019-07-26 ENCOUNTER — Ambulatory Visit: Payer: 59 | Admitting: *Deleted

## 2019-07-26 ENCOUNTER — Other Ambulatory Visit: Payer: Self-pay

## 2019-07-26 DIAGNOSIS — M25551 Pain in right hip: Secondary | ICD-10-CM | POA: Diagnosis not present

## 2019-07-26 DIAGNOSIS — M6281 Muscle weakness (generalized): Secondary | ICD-10-CM

## 2019-07-26 DIAGNOSIS — R262 Difficulty in walking, not elsewhere classified: Secondary | ICD-10-CM

## 2019-07-26 DIAGNOSIS — M25552 Pain in left hip: Secondary | ICD-10-CM

## 2019-07-26 NOTE — Therapy (Signed)
[facility] [place], [place], [address] Phone: [phone]   Fax:  [phone]  Physical Therapy Treatment  Patient Details  Name: Louis [name] MRN: [id number] Date of Birth: Apr 09, 1956 Referring Provider (PT): Vela Prose, MD   Encounter Date: 07/26/2019  PT End of Session - 07/26/19 1310    Visit Number  7    Number of Visits  8    Date for PT Re-Evaluation  08/08/19    Authorization Type  FOTO; Progress note at 10th visit    PT Start Time  1300    PT Stop Time  1350    PT Time Calculation (min)  50 min       Past Medical History:  Diagnosis Date  . DDD (degenerative disc disease), lumbar   . Elevated white blood cell count   . Hyperlipidemia   . Hypertension     Past Surgical History:  Procedure Laterality Date  . TONSILLECTOMY      There were no vitals filed for this visit.  Subjective Assessment - 07/26/19 1305    Subjective  COVID19 screening performed prior to entering the building. Patient reports feeling very good today still  with mainly soreness both hips. MD 08-15-19    Pertinent History  bilateral THA 06/30/2019; HTN    Limitations  Sitting;Standing;Walking;House hold activities    How long can you sit comfortably?  15-20 minutes    How long can you stand comfortably?  10-15 mins with support    How long can you walk comfortably?  room to room    Diagnostic tests  X-ray: primary OA    Patient Stated Goals  decrease pain, walk better    Currently in Pain?  No/denies    Pain Location  Hip    Pain Orientation  Left;Right    Pain Descriptors / Indicators  Sore;Numbness    Pain Type  Surgical pain    Pain Onset  In the past 7 days                       [facility] Adult PT Treatment/Exercise - 07/26/19 0001      Exercises   Exercises  Knee/Hip      Knee/Hip Exercises: Aerobic   Stationary Bike  Level 5 x17 mins seat 11      Knee/Hip Exercises: Standing   Knee Flexion   Both;10 reps;2 sets    Knee Flexion Limitations  2#    Hip Flexion  15 reps;AROM;3 sets    Hip Abduction  Both;10 reps;3 sets    Abduction Limitations  2#    Lateral Step Up  2 sets;Hand Hold: 2;Step Height: 6";10 reps    Forward Step Up  Both;10 reps;Step Height: 6";Hand Hold: 2;2 sets;Hand Hold: 0    Rocker Board  Other (comment)   6 mins total (63mins AP/ 3 mins lateral) balance   Other Standing Knee Exercises  lateral stepping parallel bars green tube/velrco x2 minutes      Knee/Hip Exercises: Seated   Long Arc Quad  Strengthening;Both;3 sets;10 reps    Long Arc Quad Weight  2 lbs.    Sit to Sand  2 sets;10 reps;without UE support               PT Short Term Goals - 07/04/19 2042      PT SHORT TERM GOAL #1   Title  STG=LTG        PT Long Term Goals -  07/26/19 1402      PT LONG TERM GOAL #1   Title  Patient will be independent with HEP and its progresssion.    Time  4    Period  Weeks    Status  On-going      PT LONG TERM GOAL #2   Title  Patient will demonstrate 115+ degrees of bilateral hip flexion for ease of lower body dressing.    Time  4    Period  Weeks    Status  On-going      PT LONG TERM GOAL #3   Title  Patient will demonstrate 4+/5 or greater bilateral hip and knee MMT in all planes to improve stability during functional tasks.    Time  4    Period  Weeks    Status  On-going      PT LONG TERM GOAL #4   Title  Patient will report ability to perform ADLs independently with bilateral hip pain less than or equal to 2/10.    Time  4    Period  Weeks    Status  On-going      PT LONG TERM GOAL #5   Title  Patient will negotiate 3 steps with reciporcating gait pattern with LRAD or no AD and no railing to safely enter and exit home.    Time  4    Period  Weeks    Status  On-going            Plan - 07/26/19 1353    Clinical Impression Statement  Pt arrived today still doing fairly well except for some soreness and numbness in both hips. He  was able to progress with therex today with mainly fatigue and no increase in pain. Pt will DC after next visit.    Personal Factors and Comorbidities  Age;Comorbidity 1    Comorbidities  HTN; bilateral THA 06/30/2019    Examination-Activity Limitations  Bathing;Bed Mobility;Locomotion Level;Dressing;Hygiene/Grooming;Toileting;Stand;Stairs;Squat;Sit;Transfers    Examination-Participation Restrictions  Cleaning;Meal Prep;Yard Work    Stability/Clinical Decision Making  Stable/Uncomplicated    Rehab Potential  Good    PT Frequency  2x / week    PT Duration  4 weeks    PT Treatment/Interventions  ADLs/Self Care Home Management;Cryotherapy;Electrical Stimulation;Moist Heat;Gait training;Stair training;Functional mobility training;Therapeutic activities;Therapeutic exercise;Balance training;Passive range of motion;Patient/family education;Neuromuscular re-education;Manual techniques    PT Next Visit Plan  ; Nustep; ROM of bilateral hips, standing and supine exercises, modalities PRN for pain relief    PT Home Exercise Plan  see patient education section    Consulted and Agree with Plan of Care  Patient       Patient will benefit from skilled therapeutic intervention in order to improve the following deficits and impairments:  Decreased activity tolerance, Decreased balance, Decreased mobility, Decreased strength, Decreased range of motion, Difficulty walking, Pain  Visit Diagnosis: Pain in right hip  Pain in left hip  Muscle weakness (generalized)  Difficulty in walking, not elsewhere classified     Problem List Patient Active Problem List   Diagnosis Date Noted  . Pain management contract signed 12/14/2018  . Greater trochanteric bursitis of both hips 06/26/2017  . Arthritis of right hip 06/02/2017  . Degenerative disc disease, lumbar 06/02/2017  . Piriformis syndrome of left side 09/03/2015  . HLD (hyperlipidemia) 09/03/2015  . HTN (hypertension) 09/03/2015  . Onychodystrophy  09/03/2015    Chasin Findling,CHRIS, PTA 07/26/2019, 2:03 PM  [facility] [place], [place], [address]  Phone: [phone]   Fax:  [phone]  Name: Louis [name] MRN: [id number] Date of Birth: 10/23/1955

## 2019-07-28 ENCOUNTER — Encounter: Payer: Self-pay | Admitting: *Deleted

## 2019-07-28 ENCOUNTER — Ambulatory Visit: Payer: 59 | Admitting: *Deleted

## 2019-07-28 ENCOUNTER — Other Ambulatory Visit: Payer: Self-pay

## 2019-07-28 DIAGNOSIS — R262 Difficulty in walking, not elsewhere classified: Secondary | ICD-10-CM

## 2019-07-28 DIAGNOSIS — M25551 Pain in right hip: Secondary | ICD-10-CM

## 2019-07-28 DIAGNOSIS — M25552 Pain in left hip: Secondary | ICD-10-CM

## 2019-07-28 DIAGNOSIS — M6281 Muscle weakness (generalized): Secondary | ICD-10-CM

## 2019-07-28 NOTE — Therapy (Signed)
[facility] [place], [place], [address] Phone: [phone]   Fax:  [phone]  Physical Therapy Treatment PHYSICAL THERAPY DISCHARGE SUMMARY  Visits from Start of Care: 8  Current functional level related to goals / functional outcomes: See below   Remaining deficits: See goals.    Education / Equipment: HEP [place] Plan: Patient agrees to discharge.  Patient goals were partially met. Patient is being discharged due to being pleased with the current functional level.  ?????  Gabriela Eves, PT, DPT    Patient Details  Name: Louis [name] MRN: [id number] Date of Birth: 1956-01-23 Referring Provider (PT): Vela Prose, MD   Encounter Date: 07/28/2019  PT End of Session - 07/28/19 1309    Visit Number  8    Number of Visits  8    Date for PT Re-Evaluation  08/08/19    Authorization Type  FOTO; Progress note at 10th visit   8th visit FOTO 31% limited    PT Start Time  1300    PT Stop Time  1350    PT Time Calculation (min)  50 min       Past Medical History:  Diagnosis Date  . DDD (degenerative disc disease), lumbar   . Elevated white blood cell count   . Hyperlipidemia   . Hypertension     Past Surgical History:  Procedure Laterality Date  . TONSILLECTOMY      There were no vitals filed for this visit.  Subjective Assessment - 07/28/19 1305    Subjective  COVID19 screening performed prior to entering the building.I'm  still  with mainly soreness both hips. MD 08-15-19    Pertinent History  bilateral THA 06/30/2019; HTN    Limitations  Sitting;Standing;Walking;House hold activities    How long can you sit comfortably?  15-20 minutes    How long can you stand comfortably?  10-15 mins with support    How long can you walk comfortably?  room to room    Diagnostic tests  X-ray: primary OA    Patient Stated Goals  decrease pain, walk better    Currently in Pain?  No/denies    Pain Location   Hip    Pain Orientation  Right;Left    Pain Onset  In the past 7 days                       [facility] Adult PT Treatment/Exercise - 07/28/19 0001      Exercises   Exercises  Knee/Hip      Knee/Hip Exercises: Aerobic   Stationary Bike  Level 5 x17 mins seat 12, 11      Knee/Hip Exercises: Standing   Knee Flexion  Both;10 reps;2 sets    Knee Flexion Limitations  3#    Hip Flexion  15 reps;AROM;3 sets    Hip Abduction  Both;10 reps;3 sets    Abduction Limitations  3#    Lateral Step Up  2 sets;Hand Hold: 2;Step Height: 6";10 reps    Forward Step Up  Both;10 reps;Step Height: 6";Hand Hold: 2;2 sets;Hand Hold: 0    Rocker Board  Other (comment)    Other Standing Knee Exercises  --      Knee/Hip Exercises: Seated   Long Arc Quad  --    Long Arc Quad [employer]  --    Clamshell with TheraBand  Red   2x15   Sit to Sand  2 sets;10 reps;without UE support  Knee/Hip Exercises: Supine   Bridges  Strengthening;Both;2 sets;10 reps;15 reps    Bridges with Clamshell  Strengthening;Both;2 sets;15 reps   red              PT Short Term Goals - 07/04/19 2042      PT SHORT TERM GOAL #1   Title  STG=LTG        PT Long Term Goals - 07/28/19 1311      PT LONG TERM GOAL #1   Title  Patient will be independent with HEP and its progresssion.    Time  4    Period  Weeks    Status  On-going      PT LONG TERM GOAL #2   Title  Patient will demonstrate 115+ degrees of bilateral hip flexion for ease of lower body dressing.    Time  4    Period  Weeks    Status  Achieved   120 degrees PROM     PT LONG TERM GOAL #3   Title  Patient will demonstrate 4+/5 or greater bilateral hip and knee MMT in all planes to improve stability during functional tasks.    Time  4    Period  Weeks    Status  Partially Met   4/5     PT LONG TERM GOAL #4   Title  Patient will report ability to perform ADLs independently with bilateral hip pain less than or equal to 2/10.    Time  4     Period  Weeks    Status  Achieved      PT LONG TERM GOAL #5   Title  Patient will negotiate 3 steps with reciporcating gait pattern with LRAD or no AD and no railing to safely enter and exit home.    Period  Weeks    Status  Achieved            Plan - 07/28/19 1352    Clinical Impression Statement  Pt arrived today doing well and was able to complete/ perform all therex and balance act's today and did well. He was able to meet all LTGs except strength due to mild weakness.  DC to HEP    Personal Factors and Comorbidities  Age;Comorbidity 1    Comorbidities  HTN; bilateral THA 06/30/2019    Examination-Activity Limitations  Bathing;Bed Mobility;Locomotion Level;Dressing;Hygiene/Grooming;Toileting;Stand;Stairs;Squat;Sit;Transfers    Examination-Participation Restrictions  Cleaning;Meal Prep;Yard Work    Stability/Clinical Decision Making  Stable/Uncomplicated    Rehab Potential  Good    PT Frequency  2x / week    PT Duration  4 weeks    PT Treatment/Interventions  ADLs/Self Care Home Management;Cryotherapy;Electrical Stimulation;Moist Heat;Gait training;Stair training;Functional mobility training;Therapeutic activities;Therapeutic exercise;Balance training;Passive range of motion;Patient/family education;Neuromuscular re-education;Manual techniques    PT Next Visit Plan  ; Nustep; ROM of bilateral hips, standing and supine exercises, modalities PRN for pain relief    PT Home Exercise Plan  see patient education section    Consulted and Agree with Plan of Care  Patient       Patient will benefit from skilled therapeutic intervention in order to improve the following deficits and impairments:  Decreased activity tolerance, Decreased balance, Decreased mobility, Decreased strength, Decreased range of motion, Difficulty walking, Pain  Visit Diagnosis: Pain in right hip  Pain in left hip  Muscle weakness (generalized)  Difficulty in walking, not elsewhere  classified     Problem List Patient Active Problem List   Diagnosis Date Noted  .  Pain management contract signed 12/14/2018  . Greater trochanteric bursitis of both hips 06/26/2017  . Arthritis of right hip 06/02/2017  . Degenerative disc disease, lumbar 06/02/2017  . Piriformis syndrome of left side 09/03/2015  . HLD (hyperlipidemia) 09/03/2015  . HTN (hypertension) 09/03/2015  . Onychodystrophy 09/03/2015    RAMSEUR,CHRIS, PTA 07/28/2019, 2:31 PM  [facility] [address] [place], [place], [address] Phone: [phone]   Fax:  [phone]  Name: Louis [name] MRN: [id number] Date of Birth: Jul 26, 1956

## 2019-08-01 ENCOUNTER — Ambulatory Visit (INDEPENDENT_AMBULATORY_CARE_PROVIDER_SITE_OTHER): Payer: 59 | Admitting: Family Medicine

## 2019-08-01 ENCOUNTER — Other Ambulatory Visit: Payer: Self-pay

## 2019-08-01 ENCOUNTER — Encounter: Payer: Self-pay | Admitting: Family Medicine

## 2019-08-01 VITALS — BP 171/77 | HR 65 | Temp 97.7°F | Ht 73.0 in | Wt 209.2 lb

## 2019-08-01 DIAGNOSIS — Z0001 Encounter for general adult medical examination with abnormal findings: Secondary | ICD-10-CM | POA: Diagnosis not present

## 2019-08-01 DIAGNOSIS — I10 Essential (primary) hypertension: Secondary | ICD-10-CM | POA: Diagnosis not present

## 2019-08-01 DIAGNOSIS — E782 Mixed hyperlipidemia: Secondary | ICD-10-CM | POA: Diagnosis not present

## 2019-08-01 DIAGNOSIS — Z23 Encounter for immunization: Secondary | ICD-10-CM | POA: Diagnosis not present

## 2019-08-01 DIAGNOSIS — Z0000 Encounter for general adult medical examination without abnormal findings: Secondary | ICD-10-CM

## 2019-08-01 MED ORDER — LISINOPRIL 40 MG PO TABS: 40.0000 mg | ORAL_TABLET | Freq: Every day | ORAL | 3 refills | Status: DC

## 2019-08-01 MED ORDER — PRAVASTATIN SODIUM 20 MG PO TABS: 20.0000 mg | ORAL_TABLET | Freq: Every day | ORAL | 3 refills | Status: DC

## 2019-08-01 MED ORDER — HYDROCHLOROTHIAZIDE 25 MG PO TABS: 25.0000 mg | ORAL_TABLET | Freq: Every day | ORAL | 3 refills | Status: DC

## 2019-08-01 NOTE — Progress Notes (Signed)
BP (!) 171/77   Pulse 65   Temp 97.7 F (36.5 C) (Temporal)   Ht _0  (1.854 m)   Wt 209 lb 3.2 oz (94.9 kg)   SpO2 97%   BMI 27.60 kg/m    Subjective:   Patient ID: Louis [name], male    DOB: 12-May-1956, 63 y.o.   MRN: [id number]  HPI: Louis [name] is a 63 y.o. male presenting on 08/01/2019 for Annual Exam   HPI Well adult exam and physical Patient is coming in today for well adult exam and physical and recheck of chronic issues.  Patient just had bilateral hip replacements and is still doing the exercises and recovery slowly. Patient denies any chest pain, shortness of breath, headaches or vision issues, abdominal complaints, diarrhea, nausea, vomiting, or joint issues.  He did quit smoking using the Chantix as of September 2020  Hypertension Patient is currently on lisinopril, and their blood pressure today is 171/77 but at home he says is getting consistently in the 010 range systolic and not below. Patient denies any lightheadedness or dizziness. Patient denies headaches, blurred vision, chest pains, shortness of breath, or weakness. Denies any side effects from medication and is content with current medication.   Hyperlipidemia Patient is coming in for recheck of his hyperlipidemia. The patient is currently taking pravastatin 20. They deny any issues with myalgias or history of liver damage from it. They deny any focal numbness or weakness or chest pain.   Relevant past medical, surgical, family and social history reviewed and updated as indicated. Interim medical history since our last visit reviewed. Allergies and medications reviewed and updated.  Review of Systems  Constitutional: Negative for chills and fever.  HENT: Negative for ear pain and tinnitus.   Eyes: Negative for pain.  Respiratory: Negative for cough, shortness of breath and wheezing.   Cardiovascular: Negative for chest pain, palpitations and leg swelling.  Gastrointestinal: Negative for abdominal pain,  blood in stool, constipation and diarrhea.  Genitourinary: Negative for dysuria and hematuria.  Musculoskeletal: Positive for arthralgias. Negative for back pain and myalgias.  Skin: Negative for rash.  Neurological: Negative for dizziness, weakness and headaches.  Psychiatric/Behavioral: Negative for suicidal ideas.    Per HPI unless specifically indicated above   Allergies as of 08/01/2019      Reactions   Penicillins Other (See Comments)   Occurred as child not sure what type of reaction he had   Tramadol Other (See Comments)   Patient states it made him feel like he had the flu      Medication List       Accurate as of August 01, 2019  8:13 AM. If you have any questions, ask your nurse or doctor.        STOP taking these medications   diclofenac 75 MG EC tablet Commonly known as: VOLTAREN Stopped by: Worthy Rancher, MD   predniSONE 50 MG tablet Commonly known as: DELTASONE Stopped by: Fransisca Kaufmann Ege Muckey, MD     TAKE these medications   celecoxib 200 MG capsule Commonly known as: CELEBREX Take 200 mg by mouth daily.   HYDROcodone-acetaminophen 5-325 MG tablet Commonly known as: Norco Take 1-2 tablets by mouth every 8 (eight) hours as needed for moderate pain.   lisinopril 40 MG tablet Commonly known as: ZESTRIL Take 1 tablet (40 mg total) by mouth daily.   pravastatin 20 MG tablet Commonly known as: PRAVACHOL Take 1 tablet (20 mg total) by mouth daily.   varenicline  1 MG tablet Commonly known as: Chantix Continuing Month Pak Take 1 tablet (1 mg total) by mouth 2 (two) times daily.   Chantix Starting Month Pak 0.5 MG X 11 & 1 MG X 42 tablet Generic drug: varenicline Take one 0.5 mg tablet by mouth daily for 3 days, then one 0.5 mg tablet twice daily for 4 days, then one 1 mg tablet twice daily.   Vitamin D (Ergocalciferol) 1.25 MG (50000 UT) Caps capsule Commonly known as: DRISDOL Take 1 capsule (50,000 Units total) by mouth every 7 (seven) days.         Objective:   BP (!) 171/77   Pulse 65   Temp 97.7 F (36.5 C) (Temporal)   Ht '6\' 1"'$  (1.854 m)   Wt 209 lb 3.2 oz (94.9 kg)   SpO2 97%   BMI 27.60 kg/m   Wt Readings from Last 3 Encounters:  08/01/19 209 lb 3.2 oz (94.9 kg)  12/14/18 201 lb 9.6 oz (91.4 kg)  11/23/18 209 lb (94.8 kg)    Physical Exam Vitals signs and nursing note reviewed.  Constitutional:      General: He is not in acute distress.    Appearance: He is well-developed. He is not diaphoretic.  HENT:     Right Ear: External ear normal.     Left Ear: External ear normal.     Nose: Nose normal.     Mouth/Throat:     Pharynx: No oropharyngeal exudate.  Eyes:     General: No scleral icterus.       Right eye: No discharge.     Conjunctiva/sclera: Conjunctivae normal.     Pupils: Pupils are equal, round, and reactive to light.  Neck:     Musculoskeletal: Neck supple.     Thyroid: No thyromegaly.  Cardiovascular:     Rate and Rhythm: Normal rate and regular rhythm.     Heart sounds: Normal heart sounds. No murmur.  Pulmonary:     Effort: Pulmonary effort is normal. No respiratory distress.     Breath sounds: Normal breath sounds. No wheezing.  Abdominal:     General: Bowel sounds are normal. There is no distension.     Palpations: Abdomen is soft.     Tenderness: There is no abdominal tenderness. There is no guarding or rebound.  Genitourinary:    Comments: Patient declined prostate exam today Musculoskeletal: Normal range of motion.  Lymphadenopathy:     Cervical: No cervical adenopathy.  Skin:    General: Skin is warm and dry.     Findings: No rash.  Neurological:     Mental Status: He is alert and oriented to person, place, and time.     Coordination: Coordination normal.  Psychiatric:        Behavior: Behavior normal.       Assessment & Plan:   Problem List Items Addressed This Visit      Cardiovascular and Mediastinum   HTN (hypertension)   Relevant Medications    hydrochlorothiazide (HYDRODIURIL) 25 MG tablet   lisinopril (ZESTRIL) 40 MG tablet   pravastatin (PRAVACHOL) 20 MG tablet   Other Relevant Orders   CBC with Differential/Platelet   CMP14+EGFR     Other   HLD (hyperlipidemia)   Relevant Medications   hydrochlorothiazide (HYDRODIURIL) 25 MG tablet   lisinopril (ZESTRIL) 40 MG tablet   pravastatin (PRAVACHOL) 20 MG tablet   Other Relevant Orders   Lipid panel    Other Visit Diagnoses    Well  adult exam    -  Primary   Relevant Orders   CBC with Differential/Platelet   CMP14+EGFR   Lipid panel    Patient's blood pressure is up today but he says even at home he has been getting in the 150s quite consistently recently.  We will add hydrochlorothiazide  Patient does have bilateral hip replacements and is doing therapy and exercise and feels like things are doing well.  Follow up plan: Return in about 6 months (around 01/29/2020), or if symptoms worsen or fail to improve, for Hypertension recheck.  Counseling provided for all of the vaccine components No orders of the defined types were placed in this encounter.   Caryl Pina, MD [facility] 08/01/2019, 8:13 AM

## 2019-08-02 LAB — CBC WITH DIFFERENTIAL/PLATELET
Basophils Absolute: 0.1 10*3/uL (ref 0.0–0.2)
Basos: 1 %
EOS (ABSOLUTE): 0.1 10*3/uL (ref 0.0–0.4)
Eos: 2 %
Hematocrit: 41.6 % (ref 37.5–51.0)
Hemoglobin: 14.1 g/dL (ref 13.0–17.7)
Immature Grans (Abs): 0 10*3/uL (ref 0.0–0.1)
Immature Granulocytes: 0 %
Lymphocytes Absolute: 2.6 10*3/uL (ref 0.7–3.1)
Lymphs: 33 %
MCH: 33.4 pg — ABNORMAL HIGH (ref 26.6–33.0)
MCHC: 33.9 g/dL (ref 31.5–35.7)
MCV: 99 fL — ABNORMAL HIGH (ref 79–97)
Monocytes Absolute: 0.6 10*3/uL (ref 0.1–0.9)
Monocytes: 8 %
Neutrophils Absolute: 4.4 10*3/uL (ref 1.4–7.0)
Neutrophils: 56 %
Platelets: 235 10*3/uL (ref 150–450)
RBC: 4.22 x10E6/uL (ref 4.14–5.80)
RDW: 13.4 % (ref 11.6–15.4)
WBC: 7.9 10*3/uL (ref 3.4–10.8)

## 2019-08-02 LAB — CMP14+EGFR
ALT: 31 IU/L (ref 0–44)
AST: 41 IU/L — ABNORMAL HIGH (ref 0–40)
Albumin/Globulin Ratio: 2.1 (ref 1.2–2.2)
Albumin: 4.7 g/dL (ref 3.8–4.8)
Alkaline Phosphatase: 134 IU/L — ABNORMAL HIGH (ref 39–117)
BUN/Creatinine Ratio: 13 (ref 10–24)
BUN: 10 mg/dL (ref 8–27)
Bilirubin Total: 0.8 mg/dL (ref 0.0–1.2)
CO2: 25 mmol/L (ref 20–29)
Calcium: 9.6 mg/dL (ref 8.6–10.2)
Chloride: 98 mmol/L (ref 96–106)
Creatinine, Ser: 0.78 mg/dL (ref 0.76–1.27)
GFR calc Af Amer: 112 mL/min/{1.73_m2} (ref >59)
GFR calc non Af Amer: 97 mL/min/{1.73_m2} (ref >59)
Globulin, Total: 2.2 g/dL (ref 1.5–4.5)
Glucose: 98 mg/dL (ref 65–99)
Potassium: 3.8 mmol/L (ref 3.5–5.2)
Sodium: 141 mmol/L (ref 134–144)
Total Protein: 6.9 g/dL (ref 6.0–8.5)

## 2019-08-02 LAB — LIPID PANEL
Chol/HDL Ratio: 2.7 ratio (ref 0.0–5.0)
Cholesterol, Total: 188 mg/dL (ref 100–199)
HDL: 69 mg/dL (ref >39)
LDL Chol Calc (NIH): 98 mg/dL (ref 0–99)
Triglycerides: 121 mg/dL (ref 0–149)
VLDL Cholesterol Cal: 21 mg/dL (ref 5–40)

## 2019-08-03 ENCOUNTER — Telehealth: Payer: Self-pay | Admitting: Family Medicine

## 2019-08-03 NOTE — Telephone Encounter (Signed)
Patient aware of results.

## 2019-08-15 ENCOUNTER — Other Ambulatory Visit: Payer: Self-pay | Admitting: Family Medicine

## 2019-08-15 DIAGNOSIS — Z716 Tobacco abuse counseling: Secondary | ICD-10-CM

## 2019-09-20 ENCOUNTER — Telehealth: Payer: Self-pay | Admitting: Family Medicine

## 2019-09-21 NOTE — Telephone Encounter (Signed)
Scheduled virtual appt on Friday per patients request

## 2019-09-23 ENCOUNTER — Ambulatory Visit (INDEPENDENT_AMBULATORY_CARE_PROVIDER_SITE_OTHER): Payer: BC Managed Care – PPO | Admitting: Family Medicine

## 2019-09-23 ENCOUNTER — Encounter: Payer: Self-pay | Admitting: Family Medicine

## 2019-09-23 ENCOUNTER — Telehealth: Payer: Self-pay | Admitting: *Deleted

## 2019-09-23 DIAGNOSIS — M5136 Other intervertebral disc degeneration, lumbar region: Secondary | ICD-10-CM | POA: Diagnosis not present

## 2019-09-23 DIAGNOSIS — M1611 Unilateral primary osteoarthritis, right hip: Secondary | ICD-10-CM | POA: Diagnosis not present

## 2019-09-23 MED ORDER — HYDROCODONE-ACETAMINOPHEN 5-325 MG PO TABS: 1.0000 | ORAL_TABLET | Freq: Three times a day (TID) | ORAL | 0 refills | Status: DC | PRN

## 2019-09-23 MED ORDER — CELECOXIB 200 MG PO CAPS: 200.0000 mg | ORAL_CAPSULE | Freq: Every day | ORAL | 2 refills | Status: DC

## 2019-09-23 NOTE — Progress Notes (Signed)
Virtual Visit via telephone Note  I connected with Louis [name] on 09/23/19 at 1023 by telephone and verified that I am speaking with the correct person using two identifiers. Louis [name] is currently located at home and no other people are currently with her during visit. The provider, Fransisca Kaufmann Winton Offord, MD is located in their office at time of visit.  Call ended at 1034  I discussed the limitations, risks, security and privacy concerns of performing an evaluation and management service by telephone and the availability of in person appointments. I also discussed with the patient that there may be a patient responsible charge related to this service. The patient expressed understanding and agreed to proceed.   History and Present Illness: Patient went back to work after surgery on 08/22/2019 and he had been doing a job about 1 week ago and is taking celebrex and it is helping some.  He was on diclofenac was good previously. He denies any numbness or weakness in either of his legs. He has a soreness from hip surgery and the back has been worsening recently. Pain assessment: Cause of pain- low back and hips Pain location- low back and hips Pain on scale of 1-10- 5 Frequency-some days, worse on some of his work days. What increases pain-working on concrete surfaces for prolonged periods What makes pain Better-resting and Celebrex and hydrocodone Effects on ADL -minimal limitations Any change in general medical condition-none, recently had hip surgery  Current opioids rx-hydrocodone 5-3 25 1  to 2 tablets every 8 hours as needed # meds rx-60 Effectiveness of current meds-works well Adverse reactions form pain meds-none Morphine equivalent-15-30  Pill count performed-No Last drug screen -12/20/2018 ( high risk q43m, moderate risk q5m, low risk yearly ) Urine drug screen today- No Was the [place] reviewed-yes  If yes were their any concerning findings? -He did get some oxycodone after  surgery from another provider but has finished those  No flowsheet data found.   Pain contract signed on: 12/20/2018  1. Degenerative disc disease, lumbar   2. Arthritis of right hip     Outpatient Encounter Medications as of 09/23/2019  Medication Sig  . celecoxib (CELEBREX) 200 MG capsule Take 1 capsule (200 mg total) by mouth daily.  . CHANTIX CONTINUING MONTH PAK 1 MG tablet TAKE 1 TABLET BY MOUTH TWICE A DAY  . hydrochlorothiazide (HYDRODIURIL) 25 MG tablet Take 1 tablet (25 mg total) by mouth daily.  Marland Kitchen HYDROcodone-acetaminophen (NORCO) 5-325 MG tablet Take 1-2 tablets by mouth every 8 (eight) hours as needed for moderate pain.  Marland Kitchen lisinopril (ZESTRIL) 40 MG tablet Take 1 tablet (40 mg total) by mouth daily.  . pravastatin (PRAVACHOL) 20 MG tablet Take 1 tablet (20 mg total) by mouth daily.  . varenicline (CHANTIX STARTING MONTH PAK) 0.5 MG X 11 & 1 MG X 42 tablet Take one 0.5 mg tablet by mouth daily for 3 days, then one 0.5 mg tablet twice daily for 4 days, then one 1 mg tablet twice daily.  . Vitamin D, Ergocalciferol, (DRISDOL) 1.25 MG (50000 UT) CAPS capsule Take 1 capsule (50,000 Units total) by mouth every 7 (seven) days.  . [DISCONTINUED] celecoxib (CELEBREX) 200 MG capsule Take 200 mg by mouth daily.  . [DISCONTINUED] HYDROcodone-acetaminophen (NORCO) 5-325 MG tablet Take 1-2 tablets by mouth every 8 (eight) hours as needed for moderate pain.   No facility-administered encounter medications on file as of 09/23/2019.    Review of Systems  Constitutional: Negative for chills and fever.  Eyes: Negative for visual disturbance.  Respiratory: Negative for shortness of breath and wheezing.   Cardiovascular: Negative for chest pain and leg swelling.  Musculoskeletal: Positive for arthralgias, back pain and myalgias. Negative for gait problem, joint swelling and neck stiffness.  Skin: Negative for rash.  All other systems reviewed and are negative.   Observations/Objective:  Patient sounds comfortable and in no acute distress  Assessment and Plan: Problem List Items Addressed This Visit      Musculoskeletal and Integument   Arthritis of right hip   Relevant Medications   celecoxib (CELEBREX) 200 MG capsule   HYDROcodone-acetaminophen (NORCO) 5-325 MG tablet   Degenerative disc disease, lumbar - Primary   Relevant Medications   celecoxib (CELEBREX) 200 MG capsule   HYDROcodone-acetaminophen (NORCO) 5-325 MG tablet      Will refill Celebrex and hydrocodone for his back, recommended stretching exercises. Follow up plan: Return if symptoms worsen or fail to improve.     I discussed the assessment and treatment plan with the patient. The patient was provided an opportunity to ask questions and all were answered. The patient agreed with the plan and demonstrated an understanding of the instructions.   The patient was advised to call back or seek an in-person evaluation if the symptoms worsen or if the condition fails to improve as anticipated.  The above assessment and management plan was discussed with the patient. The patient verbalized understanding of and has agreed to the management plan. Patient is aware to call the clinic if symptoms persist or worsen. Patient is aware when to return to the clinic for a follow-up visit. Patient educated on when it is appropriate to go to the emergency department.    I provided 11 minutes of non-face-to-face time during this encounter.    Worthy Rancher, MD

## 2019-09-23 NOTE — Telephone Encounter (Addendum)
Prior Auth for Hydrocodone-Acetaminophen 5-325mg -APPROVED till 10/22/19  Key: [place] notified

## 2019-11-01 ENCOUNTER — Encounter: Payer: Self-pay | Admitting: Family

## 2019-11-01 ENCOUNTER — Other Ambulatory Visit: Payer: Self-pay

## 2019-11-01 ENCOUNTER — Ambulatory Visit (INDEPENDENT_AMBULATORY_CARE_PROVIDER_SITE_OTHER): Payer: BC Managed Care – PPO | Admitting: Family

## 2019-11-01 DIAGNOSIS — F17200 Nicotine dependence, unspecified, uncomplicated: Secondary | ICD-10-CM

## 2019-11-01 DIAGNOSIS — J441 Chronic obstructive pulmonary disease with (acute) exacerbation: Secondary | ICD-10-CM | POA: Diagnosis not present

## 2019-11-01 MED ORDER — PROMETHAZINE-DM 6.25-15 MG/5ML PO SYRP: 5.0000 mL | ORAL_SOLUTION | Freq: Three times a day (TID) | ORAL | 0 refills | Status: DC | PRN

## 2019-11-01 MED ORDER — BENZONATATE 200 MG PO CAPS: 200.0000 mg | ORAL_CAPSULE | Freq: Three times a day (TID) | ORAL | 1 refills | Status: DC | PRN

## 2019-11-01 MED ORDER — PREDNISONE 10 MG (21) PO TBPK: ORAL_TABLET | ORAL | 0 refills | Status: DC

## 2019-11-01 NOTE — Progress Notes (Signed)
Virtual Visit via telephone Note Due to COVID-19 pandemic this visit was conducted virtually. This visit type was conducted due to national recommendations for restrictions regarding the COVID-19 Pandemic (e.g. social distancing, sheltering in place) in an effort to limit this patient's exposure and mitigate transmission in our community. All issues noted in this document were discussed and addressed.  A physical exam was not performed with this format.  I connected with Louis [name] on 11/01/19 at 9:12 Am by telephone and verified that I am speaking with the correct person using two identifiers. Louis [name] is currently located at work and no one is currently with him during visit. The provider, Evelina Dun, FNP is located in their office at time of visit.  I discussed the limitations, risks, security and privacy concerns of performing an evaluation and management service by telephone and the availability of in person appointments. I also discussed with the patient that there may be a patient responsible charge related to this service. The patient expressed understanding and agreed to proceed.   History and Present Illness:  Cough This is a new problem. The current episode started 1 to 4 weeks ago (three weeks). The problem has been gradually worsening. The problem occurs every few minutes. The cough is productive of sputum. Associated symptoms include shortness of breath ("at times") and wheezing. Pertinent negatives include no chills, ear congestion, ear pain, fever, headaches, myalgias, nasal congestion or postnasal drip. Risk factors for lung disease include smoking/tobacco exposure. He has tried rest and OTC cough suppressant for the symptoms. The treatment provided mild relief. His past medical history is significant for COPD.      Review of Systems  Constitutional: Negative for chills and fever.  HENT: Negative for ear pain and postnasal drip.   Respiratory: Positive for cough,  shortness of breath ("at times") and wheezing.   Musculoskeletal: Negative for myalgias.  Neurological: Negative for headaches.  All other systems reviewed and are negative.    Observations/Objective: No SOB or distress noted   Assessment and Plan: 1. COPD exacerbation ([place]) - Take meds as prescribed - Use a cool mist humidifier  -Use saline nose sprays frequently -Force fluids -For any cough or congestion  Use plain Mucinex- regular strength or max strength is fine -For fever or aces or pains- take tylenol or ibuprofen. -Throat lozenges if help -Call if symptoms worsen or do not improve  - predniSONE (STERAPRED UNI-PAK 21 TAB) 10 MG (21) TBPK tablet; Use as directed  Dispense: 21 tablet; Refill: 0 - promethazine-dextromethorphan (PROMETHAZINE-DM) 6.25-15 MG/5ML syrup; Take 5 mLs by mouth 3 (three) times daily as needed for cough.  Dispense: 118 mL; Refill: 0 - benzonatate (TESSALON) 200 MG capsule; Take 1 capsule (200 mg total) by mouth 3 (three) times daily as needed.  Dispense: 30 capsule; Refill: 1  2. Current smoker Smoking cessation discussed       I discussed the assessment and treatment plan with the patient. The patient was provided an opportunity to ask questions and all were answered. The patient agreed with the plan and demonstrated an understanding of the instructions.   The patient was advised to call back or seek an in-person evaluation if the symptoms worsen or if the condition fails to improve as anticipated.  The above assessment and management plan was discussed with the patient. The patient verbalized understanding of and has agreed to the management plan. Patient is aware to call the clinic if symptoms persist or worsen. Patient is aware when to  return to the clinic for a follow-up visit. Patient educated on when it is appropriate to go to the emergency department.   Time call ended:  9:26 AM  I provided 14 minutes of non-face-to-face time during this  encounter.    Evelina Dun, FNP

## 2019-11-11 ENCOUNTER — Telehealth: Payer: Self-pay | Admitting: Family Medicine

## 2019-11-11 MED ORDER — DOXYCYCLINE HYCLATE 100 MG PO TABS: 100.0000 mg | ORAL_TABLET | Freq: Two times a day (BID) | ORAL | 0 refills | Status: DC

## 2019-11-11 NOTE — Telephone Encounter (Signed)
Doxycycline Prescription sent to pharmacy   

## 2019-11-11 NOTE — Telephone Encounter (Signed)
Patient aware and verbalized understanding. °

## 2019-11-28 ENCOUNTER — Ambulatory Visit (INDEPENDENT_AMBULATORY_CARE_PROVIDER_SITE_OTHER): Payer: BC Managed Care – PPO | Admitting: *Deleted

## 2019-11-28 ENCOUNTER — Other Ambulatory Visit: Payer: Self-pay

## 2019-11-28 DIAGNOSIS — Z23 Encounter for immunization: Secondary | ICD-10-CM | POA: Diagnosis not present

## 2019-11-28 NOTE — Patient Instructions (Signed)

## 2019-11-28 NOTE — Progress Notes (Signed)
Pt came in today wanting TDAP vaccine due to having a newborn grandchild. TDAP given IM left deltoid and tolerated well.

## 2020-02-03 ENCOUNTER — Encounter: Payer: Self-pay | Admitting: Family Medicine

## 2020-02-03 ENCOUNTER — Other Ambulatory Visit: Payer: Self-pay

## 2020-02-03 ENCOUNTER — Ambulatory Visit: Payer: BC Managed Care – PPO | Admitting: Family Medicine

## 2020-02-03 VITALS — BP 109/73 | HR 77 | Temp 98.1°F | Ht 73.0 in | Wt 202.4 lb

## 2020-02-03 DIAGNOSIS — Z79891 Long term (current) use of opiate analgesic: Secondary | ICD-10-CM | POA: Diagnosis not present

## 2020-02-03 DIAGNOSIS — M5136 Other intervertebral disc degeneration, lumbar region: Secondary | ICD-10-CM

## 2020-02-03 DIAGNOSIS — I10 Essential (primary) hypertension: Secondary | ICD-10-CM | POA: Diagnosis not present

## 2020-02-03 DIAGNOSIS — M1611 Unilateral primary osteoarthritis, right hip: Secondary | ICD-10-CM

## 2020-02-03 DIAGNOSIS — E782 Mixed hyperlipidemia: Secondary | ICD-10-CM

## 2020-02-03 LAB — CBC WITH DIFFERENTIAL/PLATELET
Basophils Absolute: 0.1 10*3/uL (ref 0.0–0.2)
Basos: 1 %
EOS (ABSOLUTE): 0.1 10*3/uL (ref 0.0–0.4)
Eos: 1 %
Hematocrit: 50.6 % (ref 37.5–51.0)
Hemoglobin: 17.6 g/dL (ref 13.0–17.7)
Immature Grans (Abs): 0 10*3/uL (ref 0.0–0.1)
Immature Granulocytes: 0 %
Lymphocytes Absolute: 3.1 10*3/uL (ref 0.7–3.1)
Lymphs: 32 %
MCH: 33.5 pg — ABNORMAL HIGH (ref 26.6–33.0)
MCHC: 34.8 g/dL (ref 31.5–35.7)
MCV: 96 fL (ref 79–97)
Monocytes Absolute: 0.9 10*3/uL (ref 0.1–0.9)
Monocytes: 9 %
Neutrophils Absolute: 5.5 10*3/uL (ref 1.4–7.0)
Neutrophils: 57 %
Platelets: 327 10*3/uL (ref 150–450)
RBC: 5.25 x10E6/uL (ref 4.14–5.80)
RDW: 13.1 % (ref 11.6–15.4)
WBC: 9.7 10*3/uL (ref 3.4–10.8)

## 2020-02-03 LAB — CMP14+EGFR
ALT: 44 IU/L (ref 0–44)
AST: 36 IU/L (ref 0–40)
Albumin/Globulin Ratio: 2.1 (ref 1.2–2.2)
Albumin: 5.2 g/dL — ABNORMAL HIGH (ref 3.8–4.8)
Alkaline Phosphatase: 94 IU/L (ref 48–121)
BUN/Creatinine Ratio: 18 (ref 10–24)
BUN: 19 mg/dL (ref 8–27)
Bilirubin Total: 1 mg/dL (ref 0.0–1.2)
CO2: 26 mmol/L (ref 20–29)
Calcium: 10.3 mg/dL — ABNORMAL HIGH (ref 8.6–10.2)
Chloride: 97 mmol/L (ref 96–106)
Creatinine, Ser: 1.08 mg/dL (ref 0.76–1.27)
GFR calc Af Amer: 85 mL/min/{1.73_m2} (ref >59)
GFR calc non Af Amer: 73 mL/min/{1.73_m2} (ref >59)
Globulin, Total: 2.5 g/dL (ref 1.5–4.5)
Glucose: 120 mg/dL — ABNORMAL HIGH (ref 65–99)
Potassium: 5.1 mmol/L (ref 3.5–5.2)
Sodium: 138 mmol/L (ref 134–144)
Total Protein: 7.7 g/dL (ref 6.0–8.5)

## 2020-02-03 LAB — LIPID PANEL
Chol/HDL Ratio: 2.5 ratio (ref 0.0–5.0)
Cholesterol, Total: 168 mg/dL (ref 100–199)
HDL: 67 mg/dL (ref >39)
LDL Chol Calc (NIH): 85 mg/dL (ref 0–99)
Triglycerides: 89 mg/dL (ref 0–149)
VLDL Cholesterol Cal: 16 mg/dL (ref 5–40)

## 2020-02-03 MED ORDER — HYDROCODONE-ACETAMINOPHEN 5-325 MG PO TABS: 1.0000 | ORAL_TABLET | Freq: Three times a day (TID) | ORAL | 0 refills | Status: DC | PRN

## 2020-02-03 NOTE — Progress Notes (Signed)
BP 109/73   Pulse 77   Temp 98.1 F (36.7 C) (Temporal)   Ht _0  (1.854 m)   Wt 202 lb 6 oz (91.8 kg)   BMI 26.70 kg/m    Subjective:   Patient ID: Louis [name], male    DOB: [birth year], 64 y.o.   MRN: [id number]  HPI: Louis [name] is a 64 y.o. male presenting on 02/03/2020 for Medical Management of Chronic Issues   HPI Hypertension Patient is currently on lisinopril hydrochlorothiazide, and their blood pressure today is 109/73. Patient denies any lightheadedness or dizziness. Patient denies headaches, blurred vision, chest pains, shortness of breath, or weakness. Denies any side effects from medication and is content with current medication.   Hyperlipidemia Patient is coming in for recheck of his hyperlipidemia. The patient is currently taking pravastatin. They deny any issues with myalgias or history of liver damage from it. They deny any focal numbness or weakness or chest pain.   Pain assessment: Cause of pain-lumbar disc disease and arthritis in hip Pain location-lower back and right hip Pain on scale of 1-10-2 Frequency-intermittent, does not use hydrocodone but infrequently What increases pain-prolonged exercise and walking What makes pain Better-hydrocodone and stretching Effects on ADL -none Any change in general medical condition-none  Current opioids rx- hydrocodone 5-325 tid prn # meds rx- 60 Effectiveness of current meds-none Adverse reactions form pain meds-none Morphine equivalent-15-30  Pill count performed-No Last drug screen -12/20/2018 ( high risk q33m moderate risk q623mlow risk yearly ) Urine drug screen today- Yes [place]  If yes were their any concerning findings? -None   No flowsheet data found.   Pain contract signed on: 02/03/2020  Relevant past medical, surgical, family and social history reviewed and updated as indicated. Interim medical history since our last visit reviewed. Allergies and medications reviewed and  updated.  Review of Systems  Constitutional: Negative for chills and fever.  Eyes: Negative for visual disturbance.  Respiratory: Negative for shortness of breath and wheezing.   Cardiovascular: Negative for chest pain and leg swelling.  Musculoskeletal: Positive for arthralgias and back pain. Negative for gait problem.  Skin: Negative for rash.  Neurological: Negative for dizziness, weakness and light-headedness.  All other systems reviewed and are negative.   Per HPI unless specifically indicated above   Allergies as of 02/03/2020      Reactions   Penicillins Other (See Comments)   Occurred as child not sure what type of reaction he had   Tramadol Other (See Comments)   Patient states it made him feel like he had the flu      Medication List       Accurate as of Feb 03, 2020  9:00 AM. If you have any questions, ask your nurse or doctor.        STOP taking these medications   benzonatate 200 MG capsule Commonly known as: TESSALON Stopped by: JoWorthy RancherMD   Chantix Continuing Month Pak 1 MG tablet Generic drug: varenicline Stopped by: JoFransisca Kaufmannettinger, MD   Chantix Starting Month Pak 0.5 MG X 11 & 1 MG X 42 tablet Generic drug: varenicline Stopped by: JoWorthy RancherMD   doxycycline 100 MG tablet Commonly known as: VIBRA-TABS Stopped by: JoFransisca Kaufmannettinger, MD   predniSONE 10 MG (21) Tbpk tablet Commonly known as: STERAPRED UNI-PAK 21 TAB Stopped by: JoWorthy RancherMD   promethazine-dextromethorphan 6.25-15 MG/5ML syrup Commonly known as: PROMETHAZINE-DM Stopped by: JoFransisca Kaufmannettinger,  MD   Vitamin D (Ergocalciferol) 1.25 MG (50000 UNIT) Caps capsule Commonly known as: DRISDOL Stopped by: Fransisca Kaufmann Palin Tristan, MD     TAKE these medications   celecoxib 200 MG capsule Commonly known as: CELEBREX Take 1 capsule (200 mg total) by mouth daily.   hydrochlorothiazide 25 MG tablet Commonly known as: HYDRODIURIL Take 1 tablet (25 mg total)  by mouth daily.   HYDROcodone-acetaminophen 5-325 MG tablet Commonly known as: Norco Take 1-2 tablets by mouth every 8 (eight) hours as needed for moderate pain.   lisinopril 40 MG tablet Commonly known as: ZESTRIL Take 1 tablet (40 mg total) by mouth daily.   pravastatin 20 MG tablet Commonly known as: PRAVACHOL Take 1 tablet (20 mg total) by mouth daily.        Objective:   Ht _0  (1.854 m)   Wt 202 lb 6 oz (91.8 kg)   BMI 26.70 kg/m   Wt Readings from Last 3 Encounters:  02/03/20 202 lb 6 oz (91.8 kg)  08/01/19 209 lb 3.2 oz (94.9 kg)  12/14/18 201 lb 9.6 oz (91.4 kg)    Physical Exam Vitals and nursing note reviewed.  Constitutional:      General: He is not in acute distress.    Appearance: He is well-developed. He is not diaphoretic.  Eyes:     General: No scleral icterus.    Conjunctiva/sclera: Conjunctivae normal.  Cardiovascular:     Rate and Rhythm: Normal rate and regular rhythm.     Heart sounds: Normal heart sounds. No murmur.  Pulmonary:     Effort: Pulmonary effort is normal. No respiratory distress.     Breath sounds: Normal breath sounds. No wheezing.  Skin:    General: Skin is warm and dry.     Findings: No rash.  Neurological:     Mental Status: He is alert and oriented to person, place, and time.     Coordination: Coordination normal.  Psychiatric:        Behavior: Behavior normal.       Assessment & Plan:   Problem List Items Addressed This Visit      Cardiovascular and Mediastinum   HTN (hypertension) - Primary   Relevant Orders   CBC with Differential/Platelet (Completed)   CMP14+EGFR (Completed)     Musculoskeletal and Integument   Arthritis of right hip   Relevant Medications   HYDROcodone-acetaminophen (NORCO) 5-325 MG tablet   Other Relevant Orders   ToxASSURE Select 13 (MW), Urine (Completed)   Degenerative disc disease, lumbar   Relevant Medications   HYDROcodone-acetaminophen (NORCO) 5-325 MG tablet   Other  Relevant Orders   ToxASSURE Select 13 (MW), Urine (Completed)     Other   HLD (hyperlipidemia)   Relevant Orders   Lipid panel (Completed)      Continue current medication, no change in medication.,  Blood pressure looks good. Follow up plan: Return in 6 months (on 08/05/2020), or if symptoms worsen or fail to improve, for Physical exam.  Counseling provided for all of the vaccine components No orders of the defined types were placed in this encounter.   Caryl Pina, MD [facility] 02/03/2020, 9:00 AM

## 2020-02-07 LAB — TOXASSURE SELECT 13 (MW), URINE

## 2020-07-23 ENCOUNTER — Encounter: Payer: Self-pay | Admitting: Family Medicine

## 2020-07-23 ENCOUNTER — Ambulatory Visit (INDEPENDENT_AMBULATORY_CARE_PROVIDER_SITE_OTHER): Payer: BC Managed Care – PPO | Admitting: Family Medicine

## 2020-07-23 ENCOUNTER — Other Ambulatory Visit: Payer: Self-pay

## 2020-07-23 VITALS — BP 147/75 | HR 61 | Temp 96.5°F | Ht 73.0 in | Wt 202.0 lb

## 2020-07-23 DIAGNOSIS — F17200 Nicotine dependence, unspecified, uncomplicated: Secondary | ICD-10-CM

## 2020-07-23 DIAGNOSIS — M5136 Other intervertebral disc degeneration, lumbar region: Secondary | ICD-10-CM

## 2020-07-23 DIAGNOSIS — I10 Essential (primary) hypertension: Secondary | ICD-10-CM | POA: Diagnosis not present

## 2020-07-23 DIAGNOSIS — Z23 Encounter for immunization: Secondary | ICD-10-CM

## 2020-07-23 DIAGNOSIS — Z0000 Encounter for general adult medical examination without abnormal findings: Secondary | ICD-10-CM

## 2020-07-23 DIAGNOSIS — E782 Mixed hyperlipidemia: Secondary | ICD-10-CM

## 2020-07-23 DIAGNOSIS — M1611 Unilateral primary osteoarthritis, right hip: Secondary | ICD-10-CM

## 2020-07-23 MED ORDER — CHANTIX STARTING MONTH PAK 0.5 MG X 11 & 1 MG X 42 PO TABS
ORAL_TABLET | ORAL | 0 refills | Status: DC
Start: 2020-07-23 — End: 2021-07-25

## 2020-07-23 MED ORDER — VARENICLINE TARTRATE 1 MG PO TABS: 1.0000 mg | ORAL_TABLET | Freq: Two times a day (BID) | ORAL | 2 refills | Status: DC

## 2020-07-23 MED ORDER — LISINOPRIL 40 MG PO TABS: 40.0000 mg | ORAL_TABLET | Freq: Every day | ORAL | 3 refills | Status: DC

## 2020-07-23 MED ORDER — PRAVASTATIN SODIUM 20 MG PO TABS: 20.0000 mg | ORAL_TABLET | Freq: Every day | ORAL | 3 refills | Status: DC

## 2020-07-23 MED ORDER — HYDROCODONE-ACETAMINOPHEN 5-325 MG PO TABS: 1.0000 | ORAL_TABLET | Freq: Three times a day (TID) | ORAL | 0 refills | Status: DC | PRN

## 2020-07-23 NOTE — Addendum Note (Signed)
Addended by: Caryl Pina on: 07/23/2020 10:45 AM   Modules accepted: Orders

## 2020-07-23 NOTE — Progress Notes (Addendum)
BP (!) 147/75   Pulse 61   Temp (!) 96.5 F (35.8 C)   Ht _0  (1.854 m)   Wt 202 lb (91.6 kg)   SpO2 97%   BMI 26.65 kg/m    Subjective:   Patient ID: Louis [name], male    DOB: 03-14-1956, 64 y.o.   MRN: [id number]  HPI: [name] is a 64 y.o. male presenting on 07/23/2020 for Medical Management of Chronic Issues (CPE) and degenerative disc (refill hydrocodone)   HPI  Adult well exam and physical Patient is coming in for adult well exam and physical.  He says he is doing pretty good on his health.  He says he did stop smoking with Chantix but then stopped it because of the dreams and has come back to smoking now.  He would like to try the Chantix again.  He is smoking 1-1/2 packs/day currently.  He denies any shortness of breath or wheezing.  He says his only major issue is his back pain.  Pain assessment: Cause of pain-degenerative disc disease Pain location-lower back Pain on scale of 1-10- 4 Frequency-once every couple weeks gets bad enough that he needs something What increases pain-weather changes What makes pain Better-rest and heating pad and Norco Effects on ADL -minimal limitations Any change in general medical condition-none  Current opioids rx-Norco 5-3 25, 1 to 2 tablets every 8 hours as needed # meds rx-60, refills about every 6 months Effectiveness of current meds-works well Adverse reactions from pain meds-none Morphine equivalent-15-30  Pill count performed-No Last drug screen -02/03/2020 ( high risk q3m moderate risk q641mlow risk yearly ) Urine drug screen today- No [place]  If yes were their any concerning findings? -None  No flowsheet data found.   Pain contract signed on: 02/03/2020  Hypertension Patient is currently on lisinopril, and their blood pressure today is 147/75. Patient denies any lightheadedness or dizziness. Patient denies headaches, blurred vision, chest pains, shortness of breath, or weakness. Denies any  side effects from medication and is content with current medication.   Hyperlipidemia Patient is coming in for recheck of his hyperlipidemia. The patient is currently taking pravastatin. They deny any issues with myalgias or history of liver damage from it. They deny any focal numbness or weakness or chest pain.   Relevant past medical, surgical, family and social history reviewed and updated as indicated. Interim medical history since our last visit reviewed. Allergies and medications reviewed and updated.  Review of Systems  Constitutional: Negative for chills and fever.  HENT: Negative for ear pain and tinnitus.   Eyes: Negative for pain.  Respiratory: Negative for cough, shortness of breath and wheezing.   Cardiovascular: Negative for chest pain, palpitations and leg swelling.  Gastrointestinal: Negative for abdominal pain, blood in stool, constipation and diarrhea.  Genitourinary: Negative for dysuria and hematuria.  Musculoskeletal: Negative for back pain, gait problem and myalgias.  Skin: Negative for rash.  Neurological: Negative for dizziness, weakness, numbness and headaches.  Psychiatric/Behavioral: Negative for suicidal ideas.  All other systems reviewed and are negative.   Per HPI unless specifically indicated above   Allergies as of 07/23/2020      Reactions   Penicillins Other (See Comments)   Occurred as child not sure what type of reaction he had   Tramadol Other (See Comments)   Patient states it made him feel like he had the flu      Medication List  Accurate as of July 23, 2020 10:40 AM. If you have any questions, ask your nurse or doctor.        STOP taking these medications   hydrochlorothiazide 25 MG tablet Commonly known as: HYDRODIURIL Stopped by: Fransisca Kaufmann Gricelda Foland, MD     TAKE these medications   celecoxib 200 MG capsule Commonly known as: CELEBREX Take 1 capsule (200 mg total) by mouth daily.   HYDROcodone-acetaminophen 5-325 MG  tablet Commonly known as: Norco Take 1-2 tablets by mouth every 8 (eight) hours as needed for moderate pain.   lisinopril 40 MG tablet Commonly known as: ZESTRIL Take 1 tablet (40 mg total) by mouth daily.   pravastatin 20 MG tablet Commonly known as: PRAVACHOL Take 1 tablet (20 mg total) by mouth daily.        Objective:   BP (!) 147/75   Pulse 61   Temp (!) 96.5 F (35.8 C)   Ht _0  (1.854 m)   Wt 202 lb (91.6 kg)   SpO2 97%   BMI 26.65 kg/m   Wt Readings from Last 3 Encounters:  07/23/20 202 lb (91.6 kg)  02/03/20 202 lb 6 oz (91.8 kg)  08/01/19 209 lb 3.2 oz (94.9 kg)    Physical Exam Vitals and nursing note reviewed.  Constitutional:      General: He is not in acute distress.    Appearance: He is well-developed. He is not diaphoretic.  Eyes:     General: No scleral icterus.    Extraocular Movements: Extraocular movements intact.     Conjunctiva/sclera: Conjunctivae normal.     Pupils: Pupils are equal, round, and reactive to light.  Neck:     Thyroid: No thyromegaly.  Cardiovascular:     Rate and Rhythm: Normal rate and regular rhythm.     Heart sounds: Normal heart sounds. No murmur heard.   Pulmonary:     Effort: Pulmonary effort is normal. No respiratory distress.     Breath sounds: Normal breath sounds. No wheezing.  Abdominal:     General: Abdomen is flat. Bowel sounds are normal.  Musculoskeletal:        General: Normal range of motion.     Cervical back: Neck supple.  Lymphadenopathy:     Cervical: No cervical adenopathy.  Skin:    General: Skin is warm and dry.     Findings: No rash.  Neurological:     Mental Status: He is alert and oriented to person, place, and time.     Coordination: Coordination normal.  Psychiatric:        Behavior: Behavior normal.       Assessment & Plan:   Problem List Items Addressed This Visit      Cardiovascular and Mediastinum   HTN (hypertension)   Relevant Medications   pravastatin (PRAVACHOL)  20 MG tablet   lisinopril (ZESTRIL) 40 MG tablet     Musculoskeletal and Integument   Arthritis of right hip   Relevant Medications   HYDROcodone-acetaminophen (NORCO) 5-325 MG tablet   Degenerative disc disease, lumbar   Relevant Medications   HYDROcodone-acetaminophen (NORCO) 5-325 MG tablet     Other   HLD (hyperlipidemia)   Relevant Medications   pravastatin (PRAVACHOL) 20 MG tablet   lisinopril (ZESTRIL) 40 MG tablet   Other Relevant Orders   Lipid panel    Other Visit Diagnoses    Well adult exam    -  Primary   Relevant Orders   CBC with Differential/Platelet  CMP14+EGFR   Lipid panel   PSA, total and free   Essential hypertension       Relevant Medications   pravastatin (PRAVACHOL) 20 MG tablet   lisinopril (ZESTRIL) 40 MG tablet   Other Relevant Orders   CMP14+EGFR      Patient will call to get colonoscopy set up again  Discussed with patient for a total of 4 minutes smoking cessation and wants to try Chantix. Follow up plan: Return in about 6 months (around 01/20/2021), or if symptoms worsen or fail to improve, for Hypertension and cholesterol recheck.  Counseling provided for all of the vaccine components Orders Placed This Encounter  Procedures  . CBC with Differential/Platelet  . CMP14+EGFR  . Lipid panel  . PSA, total and free    Caryl Pina, MD [facility] 07/23/2020, 10:40 AM

## 2020-07-24 LAB — CMP14+EGFR
ALT: 26 IU/L (ref 0–44)
AST: 22 IU/L (ref 0–40)
Albumin/Globulin Ratio: 2.3 — ABNORMAL HIGH (ref 1.2–2.2)
Albumin: 4.8 g/dL (ref 3.8–4.8)
Alkaline Phosphatase: 74 IU/L (ref 44–121)
BUN/Creatinine Ratio: 16 (ref 10–24)
BUN: 14 mg/dL (ref 8–27)
Bilirubin Total: 0.7 mg/dL (ref 0.0–1.2)
CO2: 26 mmol/L (ref 20–29)
Calcium: 10 mg/dL (ref 8.6–10.2)
Chloride: 102 mmol/L (ref 96–106)
Creatinine, Ser: 0.86 mg/dL (ref 0.76–1.27)
GFR calc Af Amer: 107 mL/min/{1.73_m2} (ref >59)
GFR calc non Af Amer: 92 mL/min/{1.73_m2} (ref >59)
Globulin, Total: 2.1 g/dL (ref 1.5–4.5)
Glucose: 98 mg/dL (ref 65–99)
Potassium: 4.7 mmol/L (ref 3.5–5.2)
Sodium: 143 mmol/L (ref 134–144)
Total Protein: 6.9 g/dL (ref 6.0–8.5)

## 2020-07-24 LAB — CBC WITH DIFFERENTIAL/PLATELET
Basophils Absolute: 0.1 10*3/uL (ref 0.0–0.2)
Basos: 1 %
EOS (ABSOLUTE): 0.2 10*3/uL (ref 0.0–0.4)
Eos: 2 %
Hematocrit: 49 % (ref 37.5–51.0)
Hemoglobin: 17 g/dL (ref 13.0–17.7)
Immature Grans (Abs): 0 10*3/uL (ref 0.0–0.1)
Immature Granulocytes: 0 %
Lymphocytes Absolute: 3.5 10*3/uL — ABNORMAL HIGH (ref 0.7–3.1)
Lymphs: 34 %
MCH: 33.1 pg — ABNORMAL HIGH (ref 26.6–33.0)
MCHC: 34.7 g/dL (ref 31.5–35.7)
MCV: 95 fL (ref 79–97)
Monocytes Absolute: 0.7 10*3/uL (ref 0.1–0.9)
Monocytes: 7 %
Neutrophils Absolute: 5.6 10*3/uL (ref 1.4–7.0)
Neutrophils: 56 %
Platelets: 220 10*3/uL (ref 150–450)
RBC: 5.14 x10E6/uL (ref 4.14–5.80)
RDW: 12.4 % (ref 11.6–15.4)
WBC: 10 10*3/uL (ref 3.4–10.8)

## 2020-07-24 LAB — LIPID PANEL
Chol/HDL Ratio: 2.9 ratio (ref 0.0–5.0)
Cholesterol, Total: 180 mg/dL (ref 100–199)
HDL: 62 mg/dL (ref >39)
LDL Chol Calc (NIH): 104 mg/dL — ABNORMAL HIGH (ref 0–99)
Triglycerides: 76 mg/dL (ref 0–149)
VLDL Cholesterol Cal: 14 mg/dL (ref 5–40)

## 2020-07-24 LAB — PSA, TOTAL AND FREE
PSA, Free Pct: 34 %
PSA, Free: 0.17 ng/mL
Prostate Specific Ag, Serum: 0.5 ng/mL (ref 0.0–4.0)

## 2020-07-25 ENCOUNTER — Telehealth: Payer: Self-pay

## 2020-07-25 MED ORDER — BUPROPION HCL ER (XL) 150 MG PO TB24: 150.0000 mg | ORAL_TABLET | Freq: Every day | ORAL | 2 refills | Status: DC

## 2020-07-25 NOTE — Telephone Encounter (Signed)
Sent wellbutrin for the patient, I did not know about the recall, that is unfortunate, hopefully they get it fixed soon

## 2020-07-25 NOTE — Telephone Encounter (Signed)
Left detailed message stating that a new Rx has been sent to the pharmacy and to call back with any questions or concerns

## 2020-07-25 NOTE — Telephone Encounter (Signed)
Aware of lab results.  Patient states that pharmacy informed him there has been a recall on Chantix and would like to know if something different could be sent in

## 2020-08-18 ENCOUNTER — Other Ambulatory Visit: Payer: Self-pay | Admitting: Family Medicine

## 2020-08-23 ENCOUNTER — Other Ambulatory Visit: Payer: Self-pay | Admitting: Family Medicine

## 2020-10-31 ENCOUNTER — Other Ambulatory Visit: Payer: Self-pay | Admitting: Family Medicine

## 2021-01-28 ENCOUNTER — Other Ambulatory Visit: Payer: Self-pay | Admitting: Family Medicine

## 2021-05-07 DIAGNOSIS — D181 Lymphangioma, any site: Secondary | ICD-10-CM | POA: Diagnosis not present

## 2021-05-07 DIAGNOSIS — D234 Other benign neoplasm of skin of scalp and neck: Secondary | ICD-10-CM | POA: Diagnosis not present

## 2021-05-07 DIAGNOSIS — D485 Neoplasm of uncertain behavior of skin: Secondary | ICD-10-CM | POA: Diagnosis not present

## 2021-05-07 DIAGNOSIS — L821 Other seborrheic keratosis: Secondary | ICD-10-CM | POA: Diagnosis not present

## 2021-06-23 ENCOUNTER — Other Ambulatory Visit: Payer: Self-pay | Admitting: Family Medicine

## 2021-06-23 DIAGNOSIS — I10 Essential (primary) hypertension: Secondary | ICD-10-CM

## 2021-07-25 ENCOUNTER — Encounter: Payer: Self-pay | Admitting: Family Medicine

## 2021-07-25 ENCOUNTER — Ambulatory Visit (INDEPENDENT_AMBULATORY_CARE_PROVIDER_SITE_OTHER): Payer: BC Managed Care – PPO | Admitting: Family Medicine

## 2021-07-25 ENCOUNTER — Other Ambulatory Visit: Payer: Self-pay

## 2021-07-25 VITALS — BP 145/78 | HR 42 | Ht 73.0 in | Wt 196.0 lb

## 2021-07-25 DIAGNOSIS — Z0001 Encounter for general adult medical examination with abnormal findings: Secondary | ICD-10-CM

## 2021-07-25 DIAGNOSIS — M5136 Other intervertebral disc degeneration, lumbar region: Secondary | ICD-10-CM

## 2021-07-25 DIAGNOSIS — Z125 Encounter for screening for malignant neoplasm of prostate: Secondary | ICD-10-CM | POA: Diagnosis not present

## 2021-07-25 DIAGNOSIS — Z23 Encounter for immunization: Secondary | ICD-10-CM

## 2021-07-25 DIAGNOSIS — E782 Mixed hyperlipidemia: Secondary | ICD-10-CM | POA: Diagnosis not present

## 2021-07-25 DIAGNOSIS — M1611 Unilateral primary osteoarthritis, right hip: Secondary | ICD-10-CM

## 2021-07-25 DIAGNOSIS — I10 Essential (primary) hypertension: Secondary | ICD-10-CM | POA: Diagnosis not present

## 2021-07-25 DIAGNOSIS — Z79891 Long term (current) use of opiate analgesic: Secondary | ICD-10-CM | POA: Diagnosis not present

## 2021-07-25 DIAGNOSIS — Z0289 Encounter for other administrative examinations: Secondary | ICD-10-CM

## 2021-07-25 MED ORDER — PRAVASTATIN SODIUM 20 MG PO TABS: 20.0000 mg | ORAL_TABLET | Freq: Every day | ORAL | 3 refills | Status: DC

## 2021-07-25 MED ORDER — HYDROCODONE-ACETAMINOPHEN 5-325 MG PO TABS: 1.0000 | ORAL_TABLET | Freq: Three times a day (TID) | ORAL | 0 refills | Status: DC | PRN

## 2021-07-25 MED ORDER — LISINOPRIL 40 MG PO TABS: 40.0000 mg | ORAL_TABLET | Freq: Every day | ORAL | 3 refills | Status: DC

## 2021-07-25 NOTE — Progress Notes (Signed)
BP (!) 145/78   Pulse (!) 42   Ht 6' 1"  (1.854 m)   Wt 196 lb (88.9 kg)   SpO2 100%   BMI 25.86 kg/m    Subjective:   Patient ID: Louis [name], male    DOB: 1955/12/22, 65 y.o.   MRN: [id number]  HPI: Louis [name] is a 65 y.o. male presenting on 07/25/2021 for Medical Management of Chronic Issues (CPE)   HPI Physical exam Patient denies any chest pain, shortness of breath, headaches or vision issues, abdominal complaints, diarrhea, nausea, vomiting, or joint issues.  Patient did quit smoking with Chantix and he feels like he is doing good on it so far and just taking it 1 day at a time  Hypertension Patient is currently on lisinopril, and their blood pressure today is 145/78. Patient denies any lightheadedness or dizziness. Patient denies headaches, blurred vision, chest pains, shortness of breath, or weakness. Denies any side effects from medication and is content with current medication.  Hyperlipidemia Patient is coming in for recheck of his hyperlipidemia. The patient is currently taking pravastatin. They deny any issues with myalgias or history of liver damage from it. They deny any focal numbness or weakness or chest pain.   Pain assessment: Cause of pain-degenerative disc lumbar Pain location-lower back Pain on scale of 1-10- 1 Frequency-Daily but mainly is able to use ice and Tylenol first and rarely uses the hydrocodone What increases pain-certain movements or work What makes pain Better-ice and resting and occasionally hydrocodone Effects on ADL -none Any change in general medical condition-none  Current opioids rx-hydrocodone-acetaminophen 5-3 25 daily as needed # meds rx-30 Effectiveness of current meds-works well Adverse reactions from pain meds-none Morphine equivalent-5  Pill count performed-No Last drug screen -02/14/2020 ( high risk q43m moderate risk q673mlow risk yearly ) Urine drug screen today- Yes [place]  If yes were their any  concerning findings? -None  Pain contract signed on: Today  Relevant past medical, surgical, family and social history reviewed and updated as indicated. Interim medical history since our last visit reviewed. Allergies and medications reviewed and updated.  Review of Systems  Constitutional:  Negative for chills and fever.  Respiratory:  Negative for shortness of breath and wheezing.   Cardiovascular:  Negative for chest pain and leg swelling.  Musculoskeletal:  Positive for arthralgias and back pain. Negative for gait problem.  Skin:  Negative for rash.  Neurological:  Negative for dizziness, weakness and light-headedness.  All other systems reviewed and are negative.  Per HPI unless specifically indicated above   Allergies as of 07/25/2021       Reactions   Penicillins Other (See Comments)   Occurred as child not sure what type of reaction he had   Tramadol Other (See Comments)   Patient states it made him feel like he had the flu        Medication List        Accurate as of July 25, 2021 10:25 AM. If you have any questions, ask your nurse or doctor.          STOP taking these medications    buPROPion 150 MG 24 hr tablet Commonly known as: WELLBUTRIN XL Stopped by: JoFransisca Kaufmannettinger, MD   celecoxib 200 MG capsule Commonly known as: CELEBREX Stopped by: JoFransisca Kaufmannettinger, MD   Chantix Starting Month Pak 0.5 MG X 11 & 1 MG X 42 tablet Generic drug: varenicline Stopped by: JoWorthy RancherMD  varenicline 1 MG tablet Commonly known as: CHANTIX Stopped by: Fransisca Kaufmann Delynn Olvera, MD       TAKE these medications    HYDROcodone-acetaminophen 5-325 MG tablet Commonly known as: Norco Take 1-2 tablets by mouth every 8 (eight) hours as needed for moderate pain.   lisinopril 40 MG tablet Commonly known as: ZESTRIL Take 1 tablet (40 mg total) by mouth daily.   pravastatin 20 MG tablet Commonly known as: PRAVACHOL Take 1 tablet (20 mg total) by  mouth daily.         Objective:   BP (!) 145/78   Pulse (!) 42   Ht 6' 1"  (1.854 m)   Wt 196 lb (88.9 kg)   SpO2 100%   BMI 25.86 kg/m   Wt Readings from Last 3 Encounters:  07/25/21 196 lb (88.9 kg)  07/23/20 202 lb (91.6 kg)  02/03/20 202 lb 6 oz (91.8 kg)    Physical Exam Vitals and nursing note reviewed.  Constitutional:      General: He is not in acute distress.    Appearance: He is well-developed. He is not diaphoretic.  HENT:     Right Ear: External ear normal.     Left Ear: External ear normal.     Nose: Nose normal.     Mouth/Throat:     Pharynx: No oropharyngeal exudate.  Eyes:     General: No scleral icterus.       Right eye: No discharge.     Conjunctiva/sclera: Conjunctivae normal.     Pupils: Pupils are equal, round, and reactive to light.  Neck:     Thyroid: No thyromegaly.  Cardiovascular:     Rate and Rhythm: Normal rate and regular rhythm.     Heart sounds: Normal heart sounds. No murmur heard. Pulmonary:     Effort: Pulmonary effort is normal. No respiratory distress.     Breath sounds: Normal breath sounds. No wheezing.  Abdominal:     General: Bowel sounds are normal. There is no distension.     Palpations: Abdomen is soft.     Tenderness: There is no abdominal tenderness. There is no guarding or rebound.  Musculoskeletal:        General: No swelling. Normal range of motion.     Cervical back: Neck supple.  Lymphadenopathy:     Cervical: No cervical adenopathy.  Skin:    General: Skin is warm and dry.     Findings: No rash.  Neurological:     Mental Status: He is alert and oriented to person, place, and time.     Coordination: Coordination normal.  Psychiatric:        Behavior: Behavior normal.      Assessment & Plan:   Problem List Items Addressed This Visit       Cardiovascular and Mediastinum   HTN (hypertension)   Relevant Medications   lisinopril (ZESTRIL) 40 MG tablet   pravastatin (PRAVACHOL) 20 MG tablet   Other  Relevant Orders   CBC with Differential/Platelet     Musculoskeletal and Integument   Arthritis of right hip   Relevant Medications   HYDROcodone-acetaminophen (NORCO) 5-325 MG tablet   Other Relevant Orders   ToxASSURE Select 13 (MW), Urine   Degenerative disc disease, lumbar   Relevant Medications   HYDROcodone-acetaminophen (NORCO) 5-325 MG tablet   Other Relevant Orders   CBC with Differential/Platelet   ToxASSURE Select 13 (MW), Urine     Other   Pain management contract signed  Relevant Orders   ToxASSURE Select 13 (MW), Urine   HLD (hyperlipidemia) - Primary   Relevant Medications   lisinopril (ZESTRIL) 40 MG tablet   pravastatin (PRAVACHOL) 20 MG tablet   Other Relevant Orders   CMP14+EGFR   Lipid panel   Other Visit Diagnoses     Essential hypertension       Relevant Medications   lisinopril (ZESTRIL) 40 MG tablet   pravastatin (PRAVACHOL) 20 MG tablet   Other Relevant Orders   CMP14+EGFR   Prostate cancer screening       Relevant Orders   PSA, total and free     Continue current medicine, seems to be doing well, will do blood work.  Follow up plan: Return in about 1 year (around 07/25/2022), or if symptoms worsen or fail to improve, for Physical and hypertension and cholesterol.  Counseling provided for all of the vaccine components Orders Placed This Encounter  Procedures   CBC with Differential/Platelet   CMP14+EGFR   Lipid panel   PSA, total and free   ToxASSURE Select 13 (MW), Urine     Caryl Pina, MD [facility] 07/25/2021, 10:25 AM

## 2021-07-26 LAB — CBC WITH DIFFERENTIAL/PLATELET
Basophils Absolute: 0.1 10*3/uL (ref 0.0–0.2)
Basos: 1 %
EOS (ABSOLUTE): 0.1 10*3/uL (ref 0.0–0.4)
Eos: 2 %
Hematocrit: 47.7 % (ref 37.5–51.0)
Hemoglobin: 16.8 g/dL (ref 13.0–17.7)
Immature Grans (Abs): 0 10*3/uL (ref 0.0–0.1)
Immature Granulocytes: 0 %
Lymphocytes Absolute: 2.9 10*3/uL (ref 0.7–3.1)
Lymphs: 34 %
MCH: 32.6 pg (ref 26.6–33.0)
MCHC: 35.2 g/dL (ref 31.5–35.7)
MCV: 92 fL (ref 79–97)
Monocytes Absolute: 0.6 10*3/uL (ref 0.1–0.9)
Monocytes: 7 %
Neutrophils Absolute: 5 10*3/uL (ref 1.4–7.0)
Neutrophils: 56 %
Platelets: 266 10*3/uL (ref 150–450)
RBC: 5.16 x10E6/uL (ref 4.14–5.80)
RDW: 12 % (ref 11.6–15.4)
WBC: 8.8 10*3/uL (ref 3.4–10.8)

## 2021-07-26 LAB — CMP14+EGFR
ALT: 25 IU/L (ref 0–44)
AST: 23 IU/L (ref 0–40)
Albumin/Globulin Ratio: 2 (ref 1.2–2.2)
Albumin: 4.7 g/dL (ref 3.8–4.8)
Alkaline Phosphatase: 87 IU/L (ref 44–121)
BUN/Creatinine Ratio: 16 (ref 10–24)
BUN: 13 mg/dL (ref 8–27)
Bilirubin Total: 0.8 mg/dL (ref 0.0–1.2)
CO2: 28 mmol/L (ref 20–29)
Calcium: 9.8 mg/dL (ref 8.6–10.2)
Chloride: 104 mmol/L (ref 96–106)
Creatinine, Ser: 0.79 mg/dL (ref 0.76–1.27)
Globulin, Total: 2.3 g/dL (ref 1.5–4.5)
Glucose: 108 mg/dL — ABNORMAL HIGH (ref 70–99)
Potassium: 5.2 mmol/L (ref 3.5–5.2)
Sodium: 144 mmol/L (ref 134–144)
Total Protein: 7 g/dL (ref 6.0–8.5)
eGFR: 99 mL/min/{1.73_m2} (ref >59)

## 2021-07-26 LAB — LIPID PANEL
Chol/HDL Ratio: 2.7 ratio (ref 0.0–5.0)
Cholesterol, Total: 170 mg/dL (ref 100–199)
HDL: 63 mg/dL (ref >39)
LDL Chol Calc (NIH): 86 mg/dL (ref 0–99)
Triglycerides: 121 mg/dL (ref 0–149)
VLDL Cholesterol Cal: 21 mg/dL (ref 5–40)

## 2021-07-26 LAB — PSA, TOTAL AND FREE
PSA, Free Pct: 35 %
PSA, Free: 0.14 ng/mL
Prostate Specific Ag, Serum: 0.4 ng/mL (ref 0.0–4.0)

## 2021-07-28 ENCOUNTER — Other Ambulatory Visit: Payer: Self-pay | Admitting: Family Medicine

## 2021-07-31 LAB — TOXASSURE SELECT 13 (MW), URINE

## 2021-08-04 ENCOUNTER — Telehealth: Payer: Self-pay | Admitting: Family Medicine

## 2021-08-04 NOTE — Telephone Encounter (Signed)
Erroneous message

## 2022-01-29 ENCOUNTER — Inpatient Hospital Stay (HOSPITAL_BASED_OUTPATIENT_CLINIC_OR_DEPARTMENT_OTHER): Payer: BC Managed Care – PPO

## 2022-01-29 ENCOUNTER — Encounter (HOSPITAL_COMMUNITY): Payer: Self-pay

## 2022-01-29 ENCOUNTER — Observation Stay (HOSPITAL_COMMUNITY)
Admission: EM | Admit: 2022-01-29 | Discharge: 2022-01-30 | Disposition: A | Discharge status: Home Health | Payer: BC Managed Care – PPO | Attending: Internal Medicine | Admitting: Internal Medicine

## 2022-01-29 ENCOUNTER — Other Ambulatory Visit: Payer: Self-pay

## 2022-01-29 ENCOUNTER — Emergency Department (HOSPITAL_COMMUNITY): Payer: BC Managed Care – PPO

## 2022-01-29 DIAGNOSIS — J440 Chronic obstructive pulmonary disease with acute lower respiratory infection: Secondary | ICD-10-CM | POA: Diagnosis present

## 2022-01-29 DIAGNOSIS — I10 Essential (primary) hypertension: Secondary | ICD-10-CM | POA: Diagnosis not present

## 2022-01-29 DIAGNOSIS — Z96643 Presence of artificial hip joint, bilateral: Secondary | ICD-10-CM | POA: Diagnosis present

## 2022-01-29 DIAGNOSIS — Z720 Tobacco use: Secondary | ICD-10-CM

## 2022-01-29 DIAGNOSIS — F17220 Nicotine dependence, chewing tobacco, uncomplicated: Secondary | ICD-10-CM | POA: Diagnosis present

## 2022-01-29 DIAGNOSIS — E785 Hyperlipidemia, unspecified: Secondary | ICD-10-CM | POA: Diagnosis not present

## 2022-01-29 DIAGNOSIS — I4891 Unspecified atrial fibrillation: Secondary | ICD-10-CM

## 2022-01-29 DIAGNOSIS — F1010 Alcohol abuse, uncomplicated: Secondary | ICD-10-CM | POA: Diagnosis not present

## 2022-01-29 DIAGNOSIS — M5136 Other intervertebral disc degeneration, lumbar region: Secondary | ICD-10-CM | POA: Diagnosis present

## 2022-01-29 DIAGNOSIS — I2510 Atherosclerotic heart disease of native coronary artery without angina pectoris: Secondary | ICD-10-CM | POA: Diagnosis present

## 2022-01-29 DIAGNOSIS — F17210 Nicotine dependence, cigarettes, uncomplicated: Secondary | ICD-10-CM | POA: Insufficient documentation

## 2022-01-29 DIAGNOSIS — J189 Pneumonia, unspecified organism: Secondary | ICD-10-CM | POA: Diagnosis not present

## 2022-01-29 DIAGNOSIS — R0602 Shortness of breath: Secondary | ICD-10-CM | POA: Diagnosis not present

## 2022-01-29 DIAGNOSIS — J439 Emphysema, unspecified: Secondary | ICD-10-CM | POA: Diagnosis not present

## 2022-01-29 DIAGNOSIS — Z79899 Other long term (current) drug therapy: Secondary | ICD-10-CM | POA: Insufficient documentation

## 2022-01-29 DIAGNOSIS — I480 Paroxysmal atrial fibrillation: Secondary | ICD-10-CM | POA: Diagnosis present

## 2022-01-29 DIAGNOSIS — Z20822 Contact with and (suspected) exposure to covid-19: Secondary | ICD-10-CM | POA: Insufficient documentation

## 2022-01-29 DIAGNOSIS — E876 Hypokalemia: Secondary | ICD-10-CM | POA: Diagnosis not present

## 2022-01-29 DIAGNOSIS — J449 Chronic obstructive pulmonary disease, unspecified: Secondary | ICD-10-CM | POA: Diagnosis not present

## 2022-01-29 DIAGNOSIS — I4819 Other persistent atrial fibrillation: Secondary | ICD-10-CM | POA: Diagnosis present

## 2022-01-29 LAB — ECHOCARDIOGRAM COMPLETE
AR max vel: 2.32 cm2
AV Area VTI: 2.32 cm2
AV Area mean vel: 2.44 cm2
AV Mean grad: 4 mmHg
AV Peak grad: 9.1 mmHg
Ao pk vel: 1.51 m/s
Area-P 1/2: 3.89 cm2
Calc EF: 65 %
Height: 73 in
MV VTI: 2 cm2
S' Lateral: 3.4 cm
Single Plane A2C EF: 62.1 %
Single Plane A4C EF: 67.2 %
Weight: 3104.08 oz

## 2022-01-29 LAB — CBC WITH DIFFERENTIAL/PLATELET
Abs Immature Granulocytes: 0.13 10*3/uL — ABNORMAL HIGH (ref 0.00–0.07)
Basophils Absolute: 0.1 10*3/uL (ref 0.0–0.1)
Basophils Relative: 1 %
Eosinophils Absolute: 0 10*3/uL (ref 0.0–0.5)
Eosinophils Relative: 0 %
HCT: 45.1 % (ref 39.0–52.0)
Hemoglobin: 15.9 g/dL (ref 13.0–17.0)
Immature Granulocytes: 1 %
Lymphocytes Relative: 11 %
Lymphs Abs: 1.9 10*3/uL (ref 0.7–4.0)
MCH: 32.8 pg (ref 26.0–34.0)
MCHC: 35.3 g/dL (ref 30.0–36.0)
MCV: 93 fL (ref 80.0–100.0)
Monocytes Absolute: 1.5 10*3/uL — ABNORMAL HIGH (ref 0.1–1.0)
Monocytes Relative: 9 %
Neutro Abs: 13.4 10*3/uL — ABNORMAL HIGH (ref 1.7–7.7)
Neutrophils Relative %: 78 %
Platelets: 264 10*3/uL (ref 150–400)
RBC: 4.85 MIL/uL (ref 4.22–5.81)
RDW: 13.4 % (ref 11.5–15.5)
WBC: 17.1 10*3/uL — ABNORMAL HIGH (ref 4.0–10.5)
nRBC: 0 % (ref 0.0–0.2)

## 2022-01-29 LAB — MAGNESIUM: Magnesium: 1.6 mg/dL — ABNORMAL LOW (ref 1.7–2.4)

## 2022-01-29 LAB — BASIC METABOLIC PANEL
Anion gap: 9 (ref 5–15)
BUN: 19 mg/dL (ref 8–23)
CO2: 31 mmol/L (ref 22–32)
Calcium: 9.1 mg/dL (ref 8.9–10.3)
Chloride: 102 mmol/L (ref 98–111)
Creatinine, Ser: 0.77 mg/dL (ref 0.61–1.24)
GFR, Estimated: >60 mL/min (ref >60)
Glucose, Bld: 117 mg/dL — ABNORMAL HIGH (ref 70–99)
Potassium: 3.3 mmol/L — ABNORMAL LOW (ref 3.5–5.1)
Sodium: 142 mmol/L (ref 135–145)

## 2022-01-29 LAB — RESP PANEL BY RT-PCR (FLU A&B, COVID) ARPGX2
Influenza A by PCR: NEGATIVE
Influenza B by PCR: NEGATIVE
SARS Coronavirus 2 by RT PCR: NEGATIVE

## 2022-01-29 LAB — HEPARIN LEVEL (UNFRACTIONATED): Heparin Unfractionated: <0.1 IU/mL — ABNORMAL LOW (ref 0.30–0.70)

## 2022-01-29 LAB — TSH: TSH: 0.419 u[IU]/mL (ref 0.350–4.500)

## 2022-01-29 MED ORDER — SODIUM CHLORIDE 0.9% FLUSH: 3.0000 mL | INTRAVENOUS | Status: DC | PRN

## 2022-01-29 MED ORDER — LORAZEPAM 2 MG/ML IJ SOLN: 1.0000 mg | INTRAMUSCULAR | Status: DC | PRN

## 2022-01-29 MED ORDER — CEFTRIAXONE SODIUM 1 G IJ SOLR
1.0000 g | Freq: Once | INTRAMUSCULAR | Status: AC
Start: 2022-01-29 — End: 2022-01-29
  Administered 2022-01-29: 1 g via INTRAVENOUS
  Filled 2022-01-29: qty 10

## 2022-01-29 MED ORDER — GUAIFENESIN-DM 100-10 MG/5ML PO SYRP: 5.0000 mL | ORAL_SOLUTION | ORAL | Status: DC | PRN

## 2022-01-29 MED ORDER — SODIUM CHLORIDE 0.9 % IV SOLN: 250.0000 mL | INTRAVENOUS | Status: DC | PRN

## 2022-01-29 MED ORDER — ONDANSETRON HCL 4 MG/2ML IJ SOLN: 4.0000 mg | Freq: Four times a day (QID) | INTRAMUSCULAR | Status: DC | PRN

## 2022-01-29 MED ORDER — SODIUM CHLORIDE 0.9 % IV SOLN
500.0000 mg | INTRAVENOUS | Status: DC
  Administered 2022-01-29 – 2022-01-30 (×2): 500 mg via INTRAVENOUS
  Filled 2022-01-29 (×2): qty 5

## 2022-01-29 MED ORDER — MELATONIN 3 MG PO TABS
6.0000 mg | ORAL_TABLET | Freq: Once | ORAL | Status: AC
  Administered 2022-01-29: 6 mg via ORAL
  Filled 2022-01-29: qty 2

## 2022-01-29 MED ORDER — IPRATROPIUM-ALBUTEROL 0.5-2.5 (3) MG/3ML IN SOLN
3.0000 mL | Freq: Four times a day (QID) | RESPIRATORY_TRACT | Status: DC | PRN
  Administered 2022-01-30: 3 mL via RESPIRATORY_TRACT
  Filled 2022-01-29: qty 3

## 2022-01-29 MED ORDER — ACETAMINOPHEN 325 MG PO TABS: 650.0000 mg | ORAL_TABLET | Freq: Four times a day (QID) | ORAL | Status: DC | PRN

## 2022-01-29 MED ORDER — SODIUM CHLORIDE 0.9 % IV SOLN
1.0000 g | INTRAVENOUS | Status: DC
  Administered 2022-01-30: 1 g via INTRAVENOUS
  Filled 2022-01-29: qty 10

## 2022-01-29 MED ORDER — ONDANSETRON HCL 4 MG PO TABS: 4.0000 mg | ORAL_TABLET | Freq: Four times a day (QID) | ORAL | Status: DC | PRN

## 2022-01-29 MED ORDER — LORAZEPAM 2 MG/ML IJ SOLN
1.0000 mg | Freq: Once | INTRAMUSCULAR | Status: AC
Start: 2022-01-29 — End: 2022-01-29
  Administered 2022-01-29: 1 mg via INTRAVENOUS
  Filled 2022-01-29: qty 1

## 2022-01-29 MED ORDER — GUAIFENESIN ER 600 MG PO TB12: 600.0000 mg | ORAL_TABLET | Freq: Two times a day (BID) | ORAL | Status: DC | PRN

## 2022-01-29 MED ORDER — HEPARIN (PORCINE) 25000 UT/250ML-% IV SOLN
1250.0000 [IU]/h | INTRAVENOUS | Status: DC
  Administered 2022-01-29: 1250 [IU]/h via INTRAVENOUS
  Filled 2022-01-29: qty 250

## 2022-01-29 MED ORDER — SODIUM CHLORIDE 0.9 % IV BOLUS
1000.0000 mL | Freq: Once | INTRAVENOUS | Status: AC
  Administered 2022-01-29: 1000 mL via INTRAVENOUS

## 2022-01-29 MED ORDER — LISINOPRIL 10 MG PO TABS
40.0000 mg | ORAL_TABLET | Freq: Every day | ORAL | Status: DC
Start: 2022-01-30 — End: 2022-01-30
  Administered 2022-01-30: 40 mg via ORAL
  Filled 2022-01-29: qty 4

## 2022-01-29 MED ORDER — LORAZEPAM 1 MG PO TABS
1.0000 mg | ORAL_TABLET | ORAL | Status: DC | PRN
  Administered 2022-01-29: 3 mg via ORAL
  Administered 2022-01-29 – 2022-01-30 (×2): 1 mg via ORAL
  Administered 2022-01-30: 3 mg via ORAL
  Filled 2022-01-29: qty 3
  Filled 2022-01-29: qty 1
  Filled 2022-01-29: qty 3
  Filled 2022-01-29: qty 1

## 2022-01-29 MED ORDER — THIAMINE HCL 100 MG PO TABS
100.0000 mg | ORAL_TABLET | Freq: Every day | ORAL | Status: DC
  Administered 2022-01-29 – 2022-01-30 (×2): 100 mg via ORAL
  Filled 2022-01-29 (×2): qty 1

## 2022-01-29 MED ORDER — ENOXAPARIN SODIUM 100 MG/ML IJ SOSY
1.0000 mg/kg | PREFILLED_SYRINGE | Freq: Two times a day (BID) | INTRAMUSCULAR | Status: DC
  Administered 2022-01-29 – 2022-01-30 (×2): 87.5 mg via SUBCUTANEOUS
  Filled 2022-01-29 (×2): qty 1

## 2022-01-29 MED ORDER — SODIUM CHLORIDE 0.9% FLUSH
3.0000 mL | Freq: Two times a day (BID) | INTRAVENOUS | Status: DC
  Administered 2022-01-29 – 2022-01-30 (×2): 3 mL via INTRAVENOUS

## 2022-01-29 MED ORDER — DILTIAZEM LOAD VIA INFUSION
20.0000 mg | Freq: Once | INTRAVENOUS | Status: DC
  Filled 2022-01-29: qty 20

## 2022-01-29 MED ORDER — MAGNESIUM SULFATE 2 GM/50ML IV SOLN
2.0000 g | Freq: Once | INTRAVENOUS | Status: AC
  Administered 2022-01-29: 2 g via INTRAVENOUS
  Filled 2022-01-29: qty 50

## 2022-01-29 MED ORDER — FOLIC ACID 1 MG PO TABS
1.0000 mg | ORAL_TABLET | Freq: Every day | ORAL | Status: DC
  Administered 2022-01-29 – 2022-01-30 (×2): 1 mg via ORAL
  Filled 2022-01-29 (×2): qty 1

## 2022-01-29 MED ORDER — DILTIAZEM HCL 30 MG PO TABS
30.0000 mg | ORAL_TABLET | Freq: Three times a day (TID) | ORAL | Status: DC
  Administered 2022-01-29 (×3): 30 mg via ORAL
  Filled 2022-01-29 (×4): qty 1

## 2022-01-29 MED ORDER — ENSURE ENLIVE PO LIQD
237.0000 mL | Freq: Two times a day (BID) | ORAL | Status: DC
  Administered 2022-01-29 – 2022-01-30 (×2): 237 mL via ORAL

## 2022-01-29 MED ORDER — POTASSIUM CHLORIDE CRYS ER 20 MEQ PO TBCR
40.0000 meq | EXTENDED_RELEASE_TABLET | Freq: Two times a day (BID) | ORAL | Status: AC
  Administered 2022-01-29 (×2): 40 meq via ORAL
  Filled 2022-01-29 (×2): qty 2

## 2022-01-29 MED ORDER — ADULT MULTIVITAMIN W/MINERALS CH
1.0000 | ORAL_TABLET | Freq: Every day | ORAL | Status: DC
  Administered 2022-01-29 – 2022-01-30 (×2): 1 via ORAL
  Filled 2022-01-29 (×2): qty 1

## 2022-01-29 MED ORDER — HEPARIN BOLUS VIA INFUSION
4500.0000 [IU] | Freq: Once | INTRAVENOUS | Status: AC
  Administered 2022-01-29: 4500 [IU] via INTRAVENOUS

## 2022-01-29 MED ORDER — DOXYCYCLINE HYCLATE 100 MG PO TABS
100.0000 mg | ORAL_TABLET | Freq: Once | ORAL | Status: AC
  Administered 2022-01-29: 100 mg via ORAL
  Filled 2022-01-29: qty 1

## 2022-01-29 MED ORDER — ACETAMINOPHEN 650 MG RE SUPP: 650.0000 mg | Freq: Four times a day (QID) | RECTAL | Status: DC | PRN

## 2022-01-29 MED ORDER — LABETALOL HCL 5 MG/ML IV SOLN: 10.0000 mg | INTRAVENOUS | Status: DC | PRN

## 2022-01-29 MED ORDER — THIAMINE HCL 100 MG/ML IJ SOLN: 100.0000 mg | Freq: Every day | INTRAMUSCULAR | Status: DC

## 2022-01-29 MED ORDER — DILTIAZEM HCL-DEXTROSE 125-5 MG/125ML-% IV SOLN (PREMIX)
5.0000 mg/h | INTRAVENOUS | Status: DC
  Filled 2022-01-29: qty 125

## 2022-01-29 MED ORDER — PRAVASTATIN SODIUM 10 MG PO TABS
20.0000 mg | ORAL_TABLET | Freq: Every day | ORAL | Status: DC
  Administered 2022-01-30: 20 mg via ORAL
  Filled 2022-01-29: qty 2

## 2022-01-29 NOTE — ED Triage Notes (Signed)
Pt c/o SOB, weakness, and fever since Sunday. States the highest his temp has been was 99.8, he took motrin prior to arrival.  ? ? ?95% on RA in triage.  ?

## 2022-01-29 NOTE — ED Notes (Signed)
Wife noted to turn off pump. Nurse notified wife to not touch the pumps and request help.  ?

## 2022-01-29 NOTE — ED Notes (Signed)
Nurse noted bp and readjusted cuff. Pt verbalized he is frustrated at this time.  ?

## 2022-01-29 NOTE — ED Notes (Signed)
Pt ambulating to bathroom with a steady gait.

## 2022-01-29 NOTE — Consult Note (Signed)
?Cardiology Consultation:  ? ?Patient ID: Louis [name] ?MRN: [id number]; DOB: 1955/12/04 ? ?Admit date: 01/29/2022 ?Date of Consult: 01/29/2022 ? ?PCP:  Dettinger, Fransisca Kaufmann, MD ?  ?[facility] Providers ?Cardiologist: New   ? ? ?Patient Profile:  ? ?Louis [name] is a 66 y.o. male with a hx of HTN, tobacco abuse, hyperlipidemia  who is being seen 01/29/2022 for the evaluation of new onset afib at the request of Dr Manuella Ghazi. ? ?History of Present Illness:  ? ?Mr. Louis [name] 66 yo male history of HTN, HL, chronic back on on opiates, presents with SOB, cough, fever. While in ER episode of afib with RVR. Denies any specific associated symptoms. Cardiology consulted.  ? ? ? ?ER vitals: p 65 bp 171/71 95% RA ?K 3.3 Cr 0.77 BUN 19 WBC 17.1 Hgb 15.9 Plt 264 ?COVID neg ?Flu neg ?CXR RML opacities most likely pneumonia ?EKG"Sinus tach, LAE, LVH with strain, possible inferior Qwaves, LAFB ?Subsequent EKG with afib RVR ? ? ?Past Medical History:  ?Diagnosis Date  ? DDD (degenerative disc disease), lumbar   ? Elevated white blood cell count   ? Hyperlipidemia   ? Hypertension   ? ? ?Past Surgical History:  ?Procedure Laterality Date  ? TONSILLECTOMY    ? TOTAL HIP ARTHROPLASTY Bilateral   ?  ? ? ?Inpatient Medications: ?Scheduled Meds: ? diltiazem  20 mg Intravenous Once  ? heparin  4,500 Units Intravenous Once  ? LORazepam  1 mg Intravenous Once  ? ?Continuous Infusions: ? diltiazem (CARDIZEM) infusion    ? heparin    ? ?PRN Meds: ? ? ?Allergies:    ?Allergies  ?Allergen Reactions  ? Penicillins Other (See Comments)  ?  Occurred as child not sure what type of reaction he had  ? Tramadol Other (See Comments)  ?  Patient states it made him feel like he had the flu  ? ? ?Social History:   ?Social History  ? ?Socioeconomic History  ? Marital status: Married  ?  Spouse name: Not on file  ? Number of children: Not on file  ? Years of education: Not on file  ? Highest education level: Not on file  ?Occupational History  ? Not on file  ?Tobacco  Use  ? Smoking status: Some Days  ?  Packs/day: 0.50  ?  Years: 30.00  ?  Pack years: 15.00  ?  Types: Cigarettes  ?  Start date: 08/17/1984  ? Smokeless tobacco: Current  ?  Types: Chew  ?Substance and Sexual Activity  ? Alcohol use: Yes  ?  Alcohol/week: 14.0 standard drinks  ?  Types: 14 Cans of beer per week  ? Drug use: No  ? Sexual activity: Not on file  ?Other Topics Concern  ? Not on file  ?Social History Narrative  ? Not on file  ? ?Social Determinants of Health  ? ?Financial Resource Strain: Not on file  ?Food Insecurity: Not on file  ?Transportation Needs: Not on file  ?Physical Activity: Not on file  ?Stress: Not on file  ?Social Connections: Not on file  ?Intimate Partner Violence: Not on file  ?  ?Family History:   ? ?Family History  ?Problem Relation Age of Onset  ? Cancer Mother   ?     breast  ?  ? ?ROS:  ?Please see the history of present illness.  ? ?All other ROS reviewed and negative.    ? ?Physical Exam/Data:  ? ?Vitals:  ? 01/29/22 0900 01/29/22 1000 01/29/22 1006 01/29/22  1045  ?BP: (!) 157/104 (!) 165/108 (!) 160/110 (!) 169/115  ?Pulse:  (!) 111  (!) 130  ?Resp: (!) 30 (!) 26 (!) 33 (!) 30  ?Temp:      ?TempSrc:      ?SpO2: 93% 93% 94% 94%  ?Weight:      ?Height:      ? ?No intake or output data in the 24 hours ending 01/29/22 1115 ? ?  01/29/2022  ?  6:52 AM 07/25/2021  ?  9:48 AM 07/23/2020  ? 10:01 AM  ?Last 3 Weights  ?Weight (lbs) 194 lb 0.1 oz 196 lb 202 lb  ?Weight (kg) 88 kg 88.905 kg 91.627 kg  ?   ?Body mass index is 25.6 kg/m?.  ?General:  Well nourished, well developed, in no acute distress ?HEENT: normal ?Neck: no JVD ?Vascular: No carotid bruits; Distal pulses 2+ bilaterally ?Cardiac:  normal S1, S2; RRR; no murmur  ?Lungs:  wheezing bilaterally  ?Abd: soft, nontender, no hepatomegaly  ?Ext: no edema ?Musculoskeletal:  No deformities, BUE and BLE strength normal and equal ?Skin: warm and dry  ?Neuro:  CNs 2-12 intact, no focal abnormalities noted ?Psych:  Normal affect   ? ? ? ?Laboratory Data: ? ?High Sensitivity Troponin:  No results for input(s): TROPONINIHS in the last 720 hours.   ?Chemistry ?Recent Labs  ?Lab 01/29/22 ?0730  ?NA 142  ?K 3.3*  ?CL 102  ?CO2 31  ?GLUCOSE 117*  ?BUN 19  ?CREATININE 0.77  ?CALCIUM 9.1  ?GFRNONAA >60  ?ANIONGAP 9  ?  ?No results for input(s): PROT, ALBUMIN, AST, ALT, ALKPHOS, BILITOT in the last 168 hours. ?Lipids No results for input(s): CHOL, TRIG, HDL, LABVLDL, LDLCALC, CHOLHDL in the last 168 hours.  ?Hematology ?Recent Labs  ?Lab 01/29/22 ?0730  ?WBC 17.1*  ?RBC 4.85  ?HGB 15.9  ?HCT 45.1  ?MCV 93.0  ?MCH 32.8  ?MCHC 35.3  ?RDW 13.4  ?PLT 264  ? ?Thyroid No results for input(s): TSH, FREET4 in the last 168 hours.  ?BNPNo results for input(s): BNP, PROBNP in the last 168 hours.  ?DDimer No results for input(s): DDIMER in the last 168 hours. ? ? ?Radiology/Studies:  ?DG Chest 2 View ? ?Result Date: 01/29/2022 ?CLINICAL DATA:  Shortness of breath, weakness and fever. Suspected pneumonia. EXAM: CHEST - 2 VIEW COMPARISON:  CT chest no contrast 09/02/2016. FINDINGS: The lungs are slightly emphysematous. There is new demonstration of a hazy and reticulated infiltrative process in the right middle lobe distribution most likely due to pneumonia, with a small amount of similar opacity in the anterior base of the right upper lobe visible on the lateral view. The remaining lungs are generally clear. The sulci are sharp. The cardiomediastinal silhouette is unremarkable. There is calcification of the transverse aorta. Osteopenia and thoracic spondylosis. IMPRESSION: Right middle and to a lesser extent upper lobe opacities most likely due to multilobar pneumonia. Clinical correlation and radiographic follow-up recommended. Background COPD.  Aortic atherosclerosis. Electronically Signed   By: Telford Nab M.D.   On: 01/29/2022 07:45   ? ? ?Assessment and Plan:  ? ?1.New onset afib ?- new diagnosis this admission in setting of pneumonia,  hypokalemia,hypomagnesemia ?-3 hours of afib in ER, self converted before starting IV diltiazem.Asymptomatic so difficult to know if prior episodes.  ?- will start oral dilt '30mg'$  tid with hold parameters.  ?-CHADS2Vasc score is at least 1 (HTN) based on available information, short episode of afib x 3 hours. Started on heparin initially, if  no recurrent afib and/or CHADS2Vasc score remains <2 after further workup reasonable to d/c anticoag.  ?- consider outpatient monitor once pneumonia has resolved ? ?2.Pneumonia ?- abx per primary team ?- wheezing, long history of COPD. Probably componenent of exacerbation as well.  ? ?3.Hypokalemia/Hypomagnesemia ?- secondary to diarrhea at home. ?- KCl 94mq x 2 oral today, IV magnesium sulfate 2g.  ? ? ? ? ?For questions or updates, please contact [facility]?Please consult [email or web address] for contact info under  ? ? ?Signed, ?BCarlyle Dolly MD  ?01/29/2022 11:15 AM ? ?

## 2022-01-29 NOTE — Progress Notes (Signed)
ANTICOAGULATION CONSULT NOTE - Follow Up Consult ? ?Pharmacy Consult for heparin. ?Indication: atrial fibrillation ? ?Allergies  ?Allergen Reactions  ? Penicillins Other (See Comments)  ?  Occurred as child not sure what type of reaction he had  ? Tramadol Other (See Comments)  ?  Patient states it made him feel like he had the flu  ? ? ?Patient Measurements: ?Height: '6\' 1"'$  (185.4 cm) ?Weight: 88 kg (194 lb 0.1 oz) ?IBW/kg (Calculated) : 79.9 ?Heparin Dosing Weight: 88 kg ? ?Vital Signs: ?Temp: 97.5 ?F (36.4 ?C) (05/17 2022) ?Temp Source: Oral (05/17 2022) ?BP: 148/79 (05/17 2022) ?Pulse Rate: 70 (05/17 2022) ? ?Labs: ?Recent Labs  ?  01/29/22 ?0730 01/29/22 ?1808  ?HGB 15.9  --   ?HCT 45.1  --   ?PLT 264  --   ?HEPARINUNFRC  --  <0.10*  ?CREATININE 0.77  --   ? ? ?Estimated Creatinine Clearance: 105.4 mL/min (by C-G formula based on SCr of 0.77 mg/dL). ? ? ?Medications:  ?Scheduled:  ? diltiazem  30 mg Oral TID  ? enoxaparin (LOVENOX) injection  1 mg/kg Subcutaneous Q12H  ? feeding supplement  237 mL Oral BID BM  ? folic acid  1 mg Oral Daily  ? [START ON 01/30/2022] lisinopril  40 mg Oral Daily  ? multivitamin with minerals  1 tablet Oral Daily  ? [START ON 01/30/2022] pravastatin  20 mg Oral Daily  ? sodium chloride flush  3 mL Intravenous Q12H  ? thiamine  100 mg Oral Daily  ? Or  ? thiamine  100 mg Intravenous Daily  ? ? ?Assessment: ?HL < 0.1, subtherapeutic. RN contacted and pt is having troubles with line. It was shut off at one point today. ? ?Goal of Therapy:  ? ?Heparin level 0.3-0.7 units/ml ?Monitor platelets by anticoagulation protocol: Yes ?  ?Plan:  ?Dr was contacted. The heparin drip was changed to Lovenox and the heparin consult was discontinued. ? ?Thank you for letting me be a part of this pt's care. ? ? ?Blenda Nicely, RPh ?Clinical Pharmacist ?01/29/2022,9:40 PM ? ? ?

## 2022-01-29 NOTE — ED Provider Notes (Signed)
?[facility] ?Provider Note ? ? ?CSN: [id number] ?Arrival date & time: 01/29/22  3295 ? ?  ? ?History ? ?Chief Complaint  ?Patient presents with  ? Shortness of Breath  ? ? ?Louis [name] is a 66 y.o. male w/ hx of HTN, HLD, smoking, presented emerged department with cough.  Patient reports onset of symptoms 4 days ago on Sunday, reports he has had a productive cough with phlegm, subjective fevers at home, general malaise.  He denies headaches, myalgias, diarrhea.  He reports poor appetite and low energy.  He denies prior history of pneumonia.  He has been smoking for long time on and off.  He denies known history of COPD or emphysema.  He denies sick contacts in the house, lives with his wife. ? ?HPI ? ?  ? ?Home Medications ?Prior to Admission medications   ?Medication Sig Start Date End Date Taking? Authorizing Provider  ?guaiFENesin (MUCINEX) 600 MG 12 hr tablet Take 600 mg by mouth 2 (two) times daily as needed for cough or to loosen phlegm.   Yes [provider]  ?ibuprofen (ADVIL) 200 MG tablet Take 200 mg by mouth every 6 (six) hours as needed for fever, headache or mild pain.   Yes [provider]  ?lisinopril (ZESTRIL) 40 MG tablet Take 1 tablet (40 mg total) by mouth daily. 07/25/21  Yes Dettinger, Fransisca Kaufmann, MD  ?pravastatin (PRAVACHOL) 20 MG tablet Take 1 tablet (20 mg total) by mouth daily. 07/25/21  Yes Dettinger, Fransisca Kaufmann, MD  ?   ? ?Allergies    ?Penicillins and Tramadol   ? ?Review of Systems   ?Review of Systems ? ?Physical Exam ?Updated Vital Signs ?BP (!) 155/82 (BP Location: Right Arm)   Pulse 68   Temp 98 ?F (36.7 ?C) (Oral)   Resp 20   Ht '6\' 1"'$  (1.854 m)   Wt 88 kg   SpO2 90%   BMI 25.60 kg/m?  ?Physical Exam ?Constitutional:   ?   General: He is not in acute distress. ?HENT:  ?   Head: Normocephalic and atraumatic.  ?Eyes:  ?   Conjunctiva/sclera: Conjunctivae normal.  ?   Pupils: Pupils are equal, round, and reactive to light.  ?Cardiovascular:  ?    Rate and Rhythm: Normal rate and regular rhythm.  ?Pulmonary:  ?   Effort: Pulmonary effort is normal. No respiratory distress.  ?Abdominal:  ?   General: There is no distension.  ?   Tenderness: There is no abdominal tenderness.  ?Skin: ?   General: Skin is warm and dry.  ?Neurological:  ?   General: No focal deficit present.  ?   Mental Status: He is alert. Mental status is at baseline.  ?Psychiatric:     ?   Mood and Affect: Mood normal.     ?   Behavior: Behavior normal.  ? ? ?ED Results / Procedures / Treatments   ?Labs ?(all labs ordered are listed, but only abnormal results are displayed) ?Labs Reviewed  ?BASIC METABOLIC PANEL - Abnormal; Notable for the following components:  ?    Result Value  ? Potassium 3.3 (*)   ? Glucose, Bld 117 (*)   ? All other components within normal limits  ?CBC WITH DIFFERENTIAL/PLATELET - Abnormal; Notable for the following components:  ? WBC 17.1 (*)   ? Neutro Abs 13.4 (*)   ? Monocytes Absolute 1.5 (*)   ? Abs Immature Granulocytes 0.13 (*)   ? All other components within  normal limits  ?MAGNESIUM - Abnormal; Notable for the following components:  ? Magnesium 1.6 (*)   ? All other components within normal limits  ?RESP PANEL BY RT-PCR (FLU A&B, COVID) ARPGX2  ?EXPECTORATED SPUTUM ASSESSMENT W GRAM STAIN, RFLX TO RESP C  ?TSH  ?HEPARIN LEVEL (UNFRACTIONATED)  ?HIV ANTIBODY (ROUTINE TESTING W REFLEX)  ?HEPARIN LEVEL (UNFRACTIONATED)  ?CBC  ?HEMOGLOBIN Q6S  ?BASIC METABOLIC PANEL  ?MAGNESIUM  ? ? ?EKG ?EKG Interpretation ? ?Date/Time:  Wednesday Jan 29 2022 06:55:08 EDT ?Ventricular Rate:  108 ?PR Interval:  150 ?QRS Duration: 95 ?QT Interval:  352 ?QTC Calculation: 472 ?R Axis:   -13 ?Text Interpretation: Sinus tachycardia Biatrial enlargement Abnormal R-wave progression, early transition No prior ecg for comparison Confirmed by Octaviano Glow [phone]) on 01/29/2022 6:59:33 AM ? ?Radiology ?DG Chest 2 View ? ?Result Date: 01/29/2022 ?CLINICAL DATA:  Shortness of breath, weakness  and fever. Suspected pneumonia. EXAM: CHEST - 2 VIEW COMPARISON:  CT chest no contrast 09/02/2016. FINDINGS: The lungs are slightly emphysematous. There is new demonstration of a hazy and reticulated infiltrative process in the right middle lobe distribution most likely due to pneumonia, with a small amount of similar opacity in the anterior base of the right upper lobe visible on the lateral view. The remaining lungs are generally clear. The sulci are sharp. The cardiomediastinal silhouette is unremarkable. There is calcification of the transverse aorta. Osteopenia and thoracic spondylosis. IMPRESSION: Right middle and to a lesser extent upper lobe opacities most likely due to multilobar pneumonia. Clinical correlation and radiographic follow-up recommended. Background COPD.  Aortic atherosclerosis. Electronically Signed   By: Telford Nab M.D.   On: 01/29/2022 07:45  ? ?ECHOCARDIOGRAM COMPLETE ? ?Result Date: 01/29/2022 ?   ECHOCARDIOGRAM REPORT   Patient Name:   Louis [name] Date of Exam: 01/29/2022 Medical Rec #:  [id number]    Height:       73.0 in Accession #:    [id number]   Weight:       194.0 lb Date of Birth:  Oct 17, 1955    BSA:          2.124 m? Patient Age:    83 years     BP:           166/88 mmHg Patient Gender: M            HR:           72 bpm. Exam Location:  [facility] Procedure: 2D Echo, Cardiac Doppler and Color Doppler Indications:    Atrial Fibrillation  History:        Patient has no prior history of Echocardiogram examinations.                 Arrythmias:Atrial Fibrillation; Risk Factors:Hypertension and                 Dyslipidemia.  Sonographer:    Wenda Low Referring Phys: [id number] [place]  1. Left ventricular ejection fraction, by estimation, is 55 to 60%. The left ventricle has normal function. The left ventricle has no regional wall motion abnormalities. Left ventricular diastolic parameters are consistent with Grade I diastolic dysfunction (impaired  relaxation).  2. Right ventricular systolic function is normal. The right ventricular size is normal. Tricuspid regurgitation signal is inadequate for assessing PA pressure.  3. The mitral valve is normal in structure. No evidence of mitral valve regurgitation. No evidence of mitral stenosis.  4. The aortic valve has an indeterminant number of cusps.  Aortic valve regurgitation is not visualized. No aortic stenosis is present.  5. The inferior vena cava is normal in size with greater than 50% respiratory variability, suggesting right atrial pressure of 3 mmHg. FINDINGS  Left Ventricle: Left ventricular ejection fraction, by estimation, is 55 to 60%. The left ventricle has normal function. The left ventricle has no regional wall motion abnormalities. The left ventricular internal cavity size was normal in size. There is  no left ventricular hypertrophy. Left ventricular diastolic parameters are consistent with Grade I diastolic dysfunction (impaired relaxation). Normal left ventricular filling pressure. Right Ventricle: The right ventricular size is normal. Right vetricular wall thickness was not well visualized. Right ventricular systolic function is normal. Tricuspid regurgitation signal is inadequate for assessing PA pressure. Left Atrium: Left atrial size was normal in size. Right Atrium: Right atrial size was normal in size. Pericardium: There is no evidence of pericardial effusion. Mitral Valve: The mitral valve is normal in structure. No evidence of mitral valve regurgitation. No evidence of mitral valve stenosis. MV peak gradient, 3.3 mmHg. The mean mitral valve gradient is 1.0 mmHg. Tricuspid Valve: The tricuspid valve is not well visualized. Tricuspid valve regurgitation is not demonstrated. No evidence of tricuspid stenosis. Aortic Valve: The aortic valve has an indeterminant number of cusps. Aortic valve regurgitation is not visualized. No aortic stenosis is present. Aortic valve mean gradient measures 4.0  mmHg. Aortic valve peak gradient measures 9.1 mmHg. Aortic valve area, by VTI measures 2.32 cm?. Pulmonic Valve: The pulmonic valve was not well visualized. Pulmonic valve regurgitation is not visualized. No evidenc

## 2022-01-29 NOTE — Progress Notes (Signed)
ANTICOAGULATION CONSULT NOTE - Initial Consult ? ?Pharmacy Consult for heparin ?Indication: atrial fibrillation ? ?Allergies  ?Allergen Reactions  ? Penicillins Other (See Comments)  ?  Occurred as child not sure what type of reaction he had  ? Tramadol Other (See Comments)  ?  Patient states it made him feel like he had the flu  ? ? ?Patient Measurements: ?Height: '6\' 1"'$  (185.4 cm) ?Weight: 88 kg (194 lb 0.1 oz) ?IBW/kg (Calculated) : 79.9 ?Heparin Dosing Weight: 88 kg ? ?Vital Signs: ?Temp: 97.8 ?F (36.6 ?C) (05/17 7654) ?Temp Source: Oral (05/17 6503) ?BP: 169/115 (05/17 1045) ?Pulse Rate: 130 (05/17 1045) ? ?Labs: ?Recent Labs  ?  01/29/22 ?0730  ?HGB 15.9  ?HCT 45.1  ?PLT 264  ?CREATININE 0.77  ? ? ?Estimated Creatinine Clearance: 105.4 mL/min (by C-G formula based on SCr of 0.77 mg/dL). ? ? ?Medical History: ?Past Medical History:  ?Diagnosis Date  ? DDD (degenerative disc disease), lumbar   ? Elevated white blood cell count   ? Hyperlipidemia   ? Hypertension   ? ? ?Medications:  ?(Not in a hospital admission)  ? ?Assessment: ?Pharmacy consulted to dose heparin in patient with atrial fibrillation.  Patient is not on anticoagulation pripr to admission, ? ?Goal of Therapy:  ?Heparin level 0.3-0.7 units/ml ?Monitor platelets by anticoagulation protocol: Yes ?  ?Plan:  ?Give 4500 units bolus x 1 ?Start heparin infusion at 1250 units/hr ?Check anti-Xa level in 6 hours and daily while on heparin ?Continue to monitor H&H and platelets ? ?Margot Ables, PharmD ?Clinical Pharmacist ?01/29/2022 11:11 AM ? ? ? ?

## 2022-01-29 NOTE — Progress Notes (Signed)
*  PRELIMINARY RESULTS* ?Echocardiogram ?2D Echocardiogram has been performed. ? ?Louis [name] ?01/29/2022, 1:36 PM ?

## 2022-01-29 NOTE — H&P (Signed)
?History and Physical  ? ? ?Kaiser Belluomini QIH:[id number] DOB: Jun 17, 1956 DOA: 01/29/2022 ? ?PCP: Dettinger, Fransisca Kaufmann, MD  ? ?Patient coming from: Home ? ?Chief Complaint: Cough ? ?HPI: Louis [name] is a 66 y.o. male with medical history significant for dyslipidemia, hypertension, ongoing tobacco abuse, and alcohol abuse who presented to the ED with complaints of productive cough that began approximately 5 days ago.  This is associated with right-sided pleuritic chest pain especially with cough.  He denies any fevers, chills, or lower extremity edema.  He states that he does not monitor his blood pressure at home, but is compliant with taking his home medications.  He apparently drinks a half a pint of hard liquor daily and smokes 3 packs of cigarettes daily. ?  ?ED Course: Vital signs with tachyarrhythmia noted and EKG confirms atrial fibrillation with RVR.  He was briefly started on a Cardizem drip and then he had converted to sinus rhythm.  He is noted to have ongoing hypertension.  Leukocytosis of 17,000 noted and cough productive of yellowish sputum.  Noted to have hypokalemia.  COVID testing negative.  He has been given Rocephin and doxycycline in the ED. ? ?Review of Systems: Reviewed as noted above, otherwise negative. ? ?Past Medical History:  ?Diagnosis Date  ? DDD (degenerative disc disease), lumbar   ? Elevated white blood cell count   ? Hyperlipidemia   ? Hypertension   ? ? ?Past Surgical History:  ?Procedure Laterality Date  ? TONSILLECTOMY    ? TOTAL HIP ARTHROPLASTY Bilateral   ? ? ? reports that he has been smoking cigarettes. He started smoking about 37 years ago. He has a 15.00 pack-year smoking history. His smokeless tobacco use includes chew. He reports current alcohol use of about 14.0 standard drinks per week. He reports that he does not use drugs. ? ?Allergies  ?Allergen Reactions  ? Penicillins Other (See Comments)  ?  Occurred as child not sure what type of reaction he had  ? Tramadol Other  (See Comments)  ?  Patient states it made him feel like he had the flu  ? ? ?Family History  ?Problem Relation Age of Onset  ? Cancer Mother   ?     breast  ? ? ?Prior to Admission medications   ?Medication Sig Start Date End Date Taking? Authorizing Provider  ?guaiFENesin (MUCINEX) 600 MG 12 hr tablet Take 600 mg by mouth 2 (two) times daily as needed for cough or to loosen phlegm.   Yes [provider]  ?ibuprofen (ADVIL) 200 MG tablet Take 200 mg by mouth every 6 (six) hours as needed for fever, headache or mild pain.   Yes [provider]  ?lisinopril (ZESTRIL) 40 MG tablet Take 1 tablet (40 mg total) by mouth daily. 07/25/21  Yes Dettinger, Fransisca Kaufmann, MD  ?pravastatin (PRAVACHOL) 20 MG tablet Take 1 tablet (20 mg total) by mouth daily. 07/25/21  Yes Dettinger, Fransisca Kaufmann, MD  ? ? ?Physical Exam: ?Vitals:  ? 01/29/22 1144 01/29/22 1145 01/29/22 1200 01/29/22 1213  ?BP:   (!) 148/90   ?Pulse: 69 66  87  ?Resp: 19 (!) 35  17  ?Temp:      ?TempSrc:      ?SpO2: 92% 91%  94%  ?Weight:      ?Height:      ? ? ?Constitutional: NAD, calm, comfortable ?Vitals:  ? 01/29/22 1144 01/29/22 1145 01/29/22 1200 01/29/22 1213  ?BP:   (!) 148/90   ?Pulse: 69  66  87  ?Resp: 19 (!) 35  17  ?Temp:      ?TempSrc:      ?SpO2: 92% 91%  94%  ?Weight:      ?Height:      ? ?Eyes: lids and conjunctivae normal ?Neck: normal, supple ?Respiratory: clear to auscultation bilaterally. Normal respiratory effort. No accessory muscle use.  ?Cardiovascular: Regular rate and rhythm, no murmurs. ?Abdomen: no tenderness, no distention. Bowel sounds positive.  ?Musculoskeletal:  No edema. ?Skin: no rashes, lesions, ulcers.  ?Psychiatric: Flat affect ? ?Labs on Admission: I have personally reviewed following labs and imaging studies ? ?CBC: ?Recent Labs  ?Lab 01/29/22 ?0730  ?WBC 17.1*  ?NEUTROABS 13.4*  ?HGB 15.9  ?HCT 45.1  ?MCV 93.0  ?PLT 264  ? ?Basic Metabolic Panel: ?Recent Labs  ?Lab 01/29/22 ?0730  ?NA 142  ?K 3.3*  ?CL 102  ?CO2 31   ?GLUCOSE 117*  ?BUN 19  ?CREATININE 0.77  ?CALCIUM 9.1  ?MG 1.6*  ? ?GFR: ?Estimated Creatinine Clearance: 105.4 mL/min (by C-G formula based on SCr of 0.77 mg/dL). ?Liver Function Tests: ?No results for input(s): AST, ALT, ALKPHOS, BILITOT, PROT, ALBUMIN in the last 168 hours. ?No results for input(s): LIPASE, AMYLASE in the last 168 hours. ?No results for input(s): AMMONIA in the last 168 hours. ?Coagulation Profile: ?No results for input(s): INR, PROTIME in the last 168 hours. ?Cardiac Enzymes: ?No results for input(s): CKTOTAL, CKMB, CKMBINDEX, TROPONINI in the last 168 hours. ?BNP (last 3 results) ?No results for input(s): PROBNP in the last 8760 hours. ?HbA1C: ?No results for input(s): HGBA1C in the last 72 hours. ?CBG: ?No results for input(s): GLUCAP in the last 168 hours. ?Lipid Profile: ?No results for input(s): CHOL, HDL, LDLCALC, TRIG, CHOLHDL, LDLDIRECT in the last 72 hours. ?Thyroid Function Tests: ?Recent Labs  ?  01/29/22 ?0730  ?TSH 0.419  ? ?Anemia Panel: ?No results for input(s): VITAMINB12, FOLATE, FERRITIN, TIBC, IRON, RETICCTPCT in the last 72 hours. ?Urine analysis: ?No results found for: COLORURINE, APPEARANCEUR, [place], [place], [place], [place], [place], KETONESUR, PROTEINUR, [place], NITRITE, LEUKOCYTESUR ? ?Radiological Exams on Admission: ?DG Chest 2 View ? ?Result Date: 01/29/2022 ?CLINICAL DATA:  Shortness of breath, weakness and fever. Suspected pneumonia. EXAM: CHEST - 2 VIEW COMPARISON:  CT chest no contrast 09/02/2016. FINDINGS: The lungs are slightly emphysematous. There is new demonstration of a hazy and reticulated infiltrative process in the right middle lobe distribution most likely due to pneumonia, with a small amount of similar opacity in the anterior base of the right upper lobe visible on the lateral view. The remaining lungs are generally clear. The sulci are sharp. The cardiomediastinal silhouette is unremarkable. There is calcification of the transverse aorta.  Osteopenia and thoracic spondylosis. IMPRESSION: Right middle and to a lesser extent upper lobe opacities most likely due to multilobar pneumonia. Clinical correlation and radiographic follow-up recommended. Background COPD.  Aortic atherosclerosis. Electronically Signed   By: Telford Nab M.D.   On: 01/29/2022 07:45   ? ?EKG: Independently reviewed. Afib 121 bpm. ? ?Assessment/Plan ?Principal Problem: ?  Atrial fibrillation with RVR ([place]) ?Active Problems: ?  HLD (hyperlipidemia) ?  HTN (hypertension) ?  Tobacco abuse ?  Alcohol abuse ?  CAP (community acquired pneumonia) ?  ? ?New onset atrial fibrillation with RVR ?-Continue heparin drip for anticoagulation, currently in sinus rhythm ?-Appreciate cardiology evaluation ?-TSH 0.419 ?-Started on oral diltiazem 30 mg 3 times daily ?-Continue to monitor on telemetry and hold Cardizem drip ? ?Community-acquired pneumonia ?-Continue  on Rocephin and azithromycin for empiric coverage ?-Sputum culture and sensitivities ?-Antitussives ?-DuoNebs as needed ?-Up in chair/flutter valve ? ?Uncontrolled hypertension ?-IV labetalol as needed for severe elevations ?-Continue home medications for now with addition of diltiazem per cardiology as above ? ?Hypomagnesemia/hypokalemia ?-Repletion ordered per cardiology, will follow-up ? ?Dyslipidemia ?-Continue statin ? ?History of alcohol abuse ?-Counseled on cessation, drinks half pint of hard liquor daily ?-CIWA protocol ? ?Ongoing tobacco abuse ?-Counseled on cessation ? ? ?DVT prophylaxis: Heparin drip ?Code Status: Full ?Family Communication: Wife at bedside ?Disposition Plan:Admit for HR control and tx of PNA ?Consults called:Cardiology ?Admission status: Inpatient, SDU ? ?Severity of Illness: ?The appropriate patient status for this patient is INPATIENT. Inpatient status is judged to be reasonable and necessary in order to provide the required intensity of service to ensure the patient's safety. The patient's presenting  symptoms, physical exam findings, and initial radiographic and laboratory data in the context of their chronic comorbidities is felt to place them at high risk for further clinical deterioration. Furthermore,

## 2022-01-29 NOTE — ED Notes (Signed)
Dr. Manuella Ghazi said hold cardizem and let him be aware if pt stays at hr 71 at noon.  ?

## 2022-01-30 ENCOUNTER — Telehealth: Payer: Self-pay | Admitting: *Deleted

## 2022-01-30 DIAGNOSIS — I4891 Unspecified atrial fibrillation: Secondary | ICD-10-CM | POA: Diagnosis not present

## 2022-01-30 LAB — CBC
HCT: 42.4 % (ref 39.0–52.0)
Hemoglobin: 14.8 g/dL (ref 13.0–17.0)
MCH: 32.9 pg (ref 26.0–34.0)
MCHC: 34.9 g/dL (ref 30.0–36.0)
MCV: 94.2 fL (ref 80.0–100.0)
Platelets: 248 10*3/uL (ref 150–400)
RBC: 4.5 MIL/uL (ref 4.22–5.81)
RDW: 13.6 % (ref 11.5–15.5)
WBC: 12.6 10*3/uL — ABNORMAL HIGH (ref 4.0–10.5)
nRBC: 0 % (ref 0.0–0.2)

## 2022-01-30 LAB — BASIC METABOLIC PANEL
Anion gap: 8 (ref 5–15)
BUN: 14 mg/dL (ref 8–23)
CO2: 29 mmol/L (ref 22–32)
Calcium: 8.7 mg/dL — ABNORMAL LOW (ref 8.9–10.3)
Chloride: 106 mmol/L (ref 98–111)
Creatinine, Ser: 0.72 mg/dL (ref 0.61–1.24)
GFR, Estimated: >60 mL/min (ref >60)
Glucose, Bld: 99 mg/dL (ref 70–99)
Potassium: 4.1 mmol/L (ref 3.5–5.1)
Sodium: 143 mmol/L (ref 135–145)

## 2022-01-30 LAB — HEMOGLOBIN A1C
Hgb A1c MFr Bld: 5.4 % (ref 4.8–5.6)
Mean Plasma Glucose: 108.28 mg/dL

## 2022-01-30 LAB — HIV ANTIBODY (ROUTINE TESTING W REFLEX): HIV Screen 4th Generation wRfx: NONREACTIVE

## 2022-01-30 LAB — MAGNESIUM: Magnesium: 1.8 mg/dL (ref 1.7–2.4)

## 2022-01-30 MED ORDER — HYDRALAZINE HCL 25 MG PO TABS
25.0000 mg | ORAL_TABLET | Freq: Three times a day (TID) | ORAL | Status: DC
  Administered 2022-01-30 (×2): 25 mg via ORAL
  Filled 2022-01-30 (×3): qty 1

## 2022-01-30 MED ORDER — DILTIAZEM HCL 60 MG PO TABS
60.0000 mg | ORAL_TABLET | Freq: Four times a day (QID) | ORAL | Status: DC
  Filled 2022-01-30: qty 1

## 2022-01-30 MED ORDER — ENSURE ENLIVE PO LIQD: 237.0000 mL | Freq: Two times a day (BID) | ORAL | 12 refills | Status: DC

## 2022-01-30 MED ORDER — AZITHROMYCIN 500 MG PO TABS: 500.0000 mg | ORAL_TABLET | Freq: Every day | ORAL | 0 refills | Status: AC

## 2022-01-30 MED ORDER — THIAMINE HCL 100 MG PO TABS: 100.0000 mg | ORAL_TABLET | Freq: Every day | ORAL | 0 refills | Status: AC

## 2022-01-30 MED ORDER — GUAIFENESIN ER 600 MG PO TB12: 600.0000 mg | ORAL_TABLET | Freq: Two times a day (BID) | ORAL | 0 refills | Status: AC | PRN

## 2022-01-30 MED ORDER — HYDRALAZINE HCL 25 MG PO TABS: 25.0000 mg | ORAL_TABLET | Freq: Three times a day (TID) | ORAL | 1 refills | Status: DC

## 2022-01-30 MED ORDER — DILTIAZEM HCL 25 MG/5ML IV SOLN
10.0000 mg | Freq: Once | INTRAVENOUS | Status: AC
  Administered 2022-01-30: 10 mg via INTRAVENOUS
  Filled 2022-01-30: qty 5

## 2022-01-30 MED ORDER — LEVALBUTEROL HCL 0.63 MG/3ML IN NEBU
0.6300 mg | INHALATION_SOLUTION | Freq: Four times a day (QID) | RESPIRATORY_TRACT | Status: DC | PRN
  Administered 2022-01-30: 0.63 mg via RESPIRATORY_TRACT
  Filled 2022-01-30: qty 3

## 2022-01-30 MED ORDER — DILTIAZEM HCL ER COATED BEADS 120 MG PO CP24: 120.0000 mg | ORAL_CAPSULE | Freq: Every day | ORAL | 11 refills | Status: DC

## 2022-01-30 MED ORDER — CEFDINIR 300 MG PO CAPS: 300.0000 mg | ORAL_CAPSULE | Freq: Two times a day (BID) | ORAL | 0 refills | Status: AC

## 2022-01-30 MED ORDER — DILTIAZEM HCL 60 MG PO TABS: 60.0000 mg | ORAL_TABLET | Freq: Four times a day (QID) | ORAL | Status: DC

## 2022-01-30 MED ORDER — IPRATROPIUM-ALBUTEROL 0.5-2.5 (3) MG/3ML IN SOLN: 3.0000 mL | Freq: Four times a day (QID) | RESPIRATORY_TRACT | Status: DC

## 2022-01-30 NOTE — Progress Notes (Addendum)
tele called stating patient was in the 140s and looked like a flutter. Went to check on patient and the wife states that she was getting on him and he was getting upset. I asked him if he felt okay and if he had chest pain and he stated he felt fine. Looked at the monitor and he was in the 70s hr. Tele called again and stated he was in afib. MD Manuella Ghazi notified. MD Manuella Ghazi states to hold D/C and monitor.

## 2022-01-30 NOTE — Telephone Encounter (Signed)
Dr Manuella Ghazi sent a secure chat requesting a 30 day monitor for this patient for new onset a fib.

## 2022-01-30 NOTE — TOC Transition Note (Signed)
Transition of Care [facility]) - CM/SW Discharge Note   Patient Details  Name: Louis [name] MRN: [id number] Date of Birth: 04/27/1956  [facility] CM/SW Contact:  Boneta Lucks, RN Phone Number: 01/30/2022, 3:07 PM   Clinical Narrative:   Patient discharging home. TOC consulted for Substance Abuse and MD ordered HHPT recommended by PT.  TOC spoke with his wife Louis [name]. TOC unable to find home health agency that would take commercial insurance.  Louis [name] states she does not think he needs it and they have family member that are physical therapist. She will call them to assess and educate if needed.  He does not want any resources for substance abuse. He has follow up appointments.    At this time, MD is hold Discharge, patient went back in AFIB.     Final next level of care: Home/Self Care Barriers to Discharge: No [facility] will accept this patient  Patient Goals and CMS Choice Patient states their goals for this hospitalization and ongoing recovery are:: to go home. CMS Medicare.gov Compare Post Acute Care list provided to:: Patient Represenative (must comment) Choice offered to / list presented to : Spouse  Discharge Placement     Patient to be transferred to facility by: wife Name of family member notified: Louis [name] Patient and family notified of of transfer: 01/30/22  Discharge Plan and Services      Readmission Risk Interventions    01/30/2022    3:01 PM  Readmission Risk Prevention Plan  Post Dischage Appt Complete  Medication Screening Complete  Transportation Screening Complete

## 2022-01-30 NOTE — Progress Notes (Addendum)
Tele called stating patient's hr is in the 140s. Went to check on patient and the wife was putting on patient's pants. Patient was just laying in the bed. Patient states he has no chest pain and feels okay. MD Manuella Ghazi notified. MD Manuella Ghazi placed an order for '60mg'$  of cardizem.

## 2022-01-30 NOTE — Progress Notes (Signed)
Progress Note  Patient Name: Louis [name] Date of Encounter: 01/30/2022  [facility] Cardiologist:   Subjective   No CP  Breaghing is some better   Inpatient Medications    Scheduled Meds:  diltiazem  30 mg Oral TID   enoxaparin (LOVENOX) injection  1 mg/kg Subcutaneous Q12H   feeding supplement  237 mL Oral BID BM   folic acid  1 mg Oral Daily   hydrALAZINE  25 mg Oral Q8H   ipratropium-albuterol  3 mL Nebulization Q6H   lisinopril  40 mg Oral Daily   multivitamin with minerals  1 tablet Oral Daily   pravastatin  20 mg Oral Daily   sodium chloride flush  3 mL Intravenous Q12H   thiamine  100 mg Oral Daily   Or   thiamine  100 mg Intravenous Daily   Continuous Infusions:  sodium chloride     azithromycin 500 mg (01/30/22 1411)   cefTRIAXone (ROCEPHIN)  IV 1 g (01/30/22 0829)   PRN Meds: sodium chloride, acetaminophen **OR** acetaminophen, guaiFENesin, guaiFENesin-dextromethorphan, labetalol, LORazepam **OR** LORazepam, ondansetron **OR** ondansetron (ZOFRAN) IV, sodium chloride flush   Vital Signs    Vitals:   01/30/22 0721 01/30/22 0814 01/30/22 1254 01/30/22 1417  BP: (!) 150/90 (!) 153/64 (!) 182/84 (!) 167/78  Pulse: 75  74   Resp: 20     Temp: 98.5 F (36.9 C)  98.1 F (36.7 C)   TempSrc: Oral  Oral   SpO2: 92%  90%   Weight:      Height:        Intake/Output Summary (Last 24 hours) at 01/30/2022 1450 Last data filed at 01/30/2022 1300 Gross per 24 hour  Intake 1047.79 ml  Output --  Net 1047.79 ml      01/29/2022    6:52 AM 07/25/2021    9:48 AM 07/23/2020   10:01 AM  Last 3 Weights  Weight (lbs) 194 lb 0.1 oz 196 lb 202 lb  Weight (kg) 88 kg 88.905 kg 91.627 kg      Telemetry   SR    Per nursing and hospitalist tele this pm shows afib with RVR    - Personally Reviewed  ECG    No new  - Personally Reviewed  Physical Exam   GEN: Pt in NAD   Appears older than 65    Neck: No JVD Cardiac: RRR, no murmurs Respiratory: Rhonchi  bilaterally   Moving air GI: Soft, nontender, non-distended  MS: No edema; No deformity. Neuro:  Nonfocal  Psych: Normal affect   Labs    High Sensitivity Troponin:  No results for input(s): TROPONINIHS in the last 720 hours.   Chemistry Recent Labs  Lab 01/29/22 0730 01/30/22 0542  NA 142 143  K 3.3* 4.1  CL 102 106  CO2 31 29  GLUCOSE 117* 99  BUN 19 14  CREATININE 0.77 0.72  CALCIUM 9.1 8.7*  MG 1.6* 1.8  GFRNONAA >60 >60  ANIONGAP 9 8    Lipids No results for input(s): CHOL, TRIG, HDL, LABVLDL, LDLCALC, CHOLHDL in the last 168 hours.  Hematology Recent Labs  Lab 01/29/22 0730 01/30/22 0542  WBC 17.1* 12.6*  RBC 4.85 4.50  HGB 15.9 14.8  HCT 45.1 42.4  MCV 93.0 94.2  MCH 32.8 32.9  MCHC 35.3 34.9  RDW 13.4 13.6  PLT 264 248   Thyroid  Recent Labs  Lab 01/29/22 0730  TSH 0.419    BNPNo results for input(s): BNP, PROBNP in  the last 168 hours.  DDimer No results for input(s): DDIMER in the last 168 hours.   Radiology    DG Chest 2 View  Result Date: 01/29/2022 CLINICAL DATA:  Shortness of breath, weakness and fever. Suspected pneumonia. EXAM: CHEST - 2 VIEW COMPARISON:  CT chest no contrast 09/02/2016. FINDINGS: The lungs are slightly emphysematous. There is new demonstration of a hazy and reticulated infiltrative process in the right middle lobe distribution most likely due to pneumonia, with a small amount of similar opacity in the anterior base of the right upper lobe visible on the lateral view. The remaining lungs are generally clear. The sulci are sharp. The cardiomediastinal silhouette is unremarkable. There is calcification of the transverse aorta. Osteopenia and thoracic spondylosis. IMPRESSION: Right middle and to a lesser extent upper lobe opacities most likely due to multilobar pneumonia. Clinical correlation and radiographic follow-up recommended. Background COPD.  Aortic atherosclerosis. Electronically Signed   By: Telford Nab M.D.   On:  01/29/2022 07:45   ECHOCARDIOGRAM COMPLETE  Result Date: 01/29/2022    ECHOCARDIOGRAM REPORT   Patient Name:   [facility] Date of Exam: 01/29/2022 Medical Rec #:  [id number]    Height:       73.0 in Accession #:    [id number]   Weight:       194.0 lb Date of Birth:  September 30, 1955    BSA:          2.124 m Patient Age:    66 years     BP:           166/88 mmHg Patient Gender: M            HR:           72 bpm. Exam Location:  [facility] Procedure: 2D Echo, Cardiac Doppler and Color Doppler Indications:    Atrial Fibrillation  History:        Patient has no prior history of Echocardiogram examinations.                 Arrythmias:Atrial Fibrillation; Risk Factors:Hypertension and                 Dyslipidemia.  Sonographer:    Wenda Low Referring Phys: [id number] [place]  1. Left ventricular ejection fraction, by estimation, is 55 to 60%. The left ventricle has normal function. The left ventricle has no regional wall motion abnormalities. Left ventricular diastolic parameters are consistent with Grade I diastolic dysfunction (impaired relaxation).  2. Right ventricular systolic function is normal. The right ventricular size is normal. Tricuspid regurgitation signal is inadequate for assessing PA pressure.  3. The mitral valve is normal in structure. No evidence of mitral valve regurgitation. No evidence of mitral stenosis.  4. The aortic valve has an indeterminant number of cusps. Aortic valve regurgitation is not visualized. No aortic stenosis is present.  5. The inferior vena cava is normal in size with greater than 50% respiratory variability, suggesting right atrial pressure of 3 mmHg. FINDINGS  Left Ventricle: Left ventricular ejection fraction, by estimation, is 55 to 60%. The left ventricle has normal function. The left ventricle has no regional wall motion abnormalities. The left ventricular internal cavity size was normal in size. There is  no left ventricular hypertrophy. Left  ventricular diastolic parameters are consistent with Grade I diastolic dysfunction (impaired relaxation). Normal left ventricular filling pressure. Right Ventricle: The right ventricular size is normal. Right vetricular wall thickness was not well visualized. Right ventricular  systolic function is normal. Tricuspid regurgitation signal is inadequate for assessing PA pressure. Left Atrium: Left atrial size was normal in size. Right Atrium: Right atrial size was normal in size. Pericardium: There is no evidence of pericardial effusion. Mitral Valve: The mitral valve is normal in structure. No evidence of mitral valve regurgitation. No evidence of mitral valve stenosis. MV peak gradient, 3.3 mmHg. The mean mitral valve gradient is 1.0 mmHg. Tricuspid Valve: The tricuspid valve is not well visualized. Tricuspid valve regurgitation is not demonstrated. No evidence of tricuspid stenosis. Aortic Valve: The aortic valve has an indeterminant number of cusps. Aortic valve regurgitation is not visualized. No aortic stenosis is present. Aortic valve mean gradient measures 4.0 mmHg. Aortic valve peak gradient measures 9.1 mmHg. Aortic valve area, by VTI measures 2.32 cm. Pulmonic Valve: The pulmonic valve was not well visualized. Pulmonic valve regurgitation is not visualized. No evidence of pulmonic stenosis. Aorta: The aortic root is normal in size and structure. Venous: The inferior vena cava is normal in size with greater than 50% respiratory variability, suggesting right atrial pressure of 3 mmHg. IAS/Shunts: No atrial level shunt detected by color flow Doppler.  LEFT VENTRICLE PLAX 2D LVIDd:         4.80 cm     Diastology LVIDs:         3.40 cm     LV e' medial:    6.31 cm/s LV PW:         0.90 cm     LV E/e' medial:  10.7 LV IVS:        1.00 cm     LV e' lateral:   10.70 cm/s LVOT diam:     2.10 cm     LV E/e' lateral: 6.3 LV SV:         67 LV SV Index:   32 LVOT Area:     3.46 cm  LV Volumes (MOD) LV vol d, MOD A2C:  92.3 ml LV vol d, MOD A4C: 99.8 ml LV vol s, MOD A2C: 35.0 ml LV vol s, MOD A4C: 32.7 ml LV SV MOD A2C:     57.3 ml LV SV MOD A4C:     99.8 ml LV SV MOD BP:      62.9 ml RIGHT VENTRICLE RV Basal diam:  4.25 cm RV Mid diam:    4.40 cm RV S prime:     13.20 cm/s LEFT ATRIUM             Index        RIGHT ATRIUM           Index LA diam:        3.70 cm 1.74 cm/m   RA Area:     23.20 cm LA Vol (A2C):   62.5 ml 29.43 ml/m  RA Volume:   75.60 ml  35.59 ml/m LA Vol (A4C):   43.0 ml 20.24 ml/m LA Biplane Vol: 53.1 ml 25.00 ml/m  AORTIC VALVE                    PULMONIC VALVE AV Area (Vmax):    2.32 cm     PV Vmax:       0.88 m/s AV Area (Vmean):   2.44 cm     PV Peak grad:  3.1 mmHg AV Area (VTI):     2.32 cm AV Vmax:           151.00 cm/s AV Vmean:  90.100 cm/s AV VTI:            0.290 m AV Peak Grad:      9.1 mmHg AV Mean Grad:      4.0 mmHg LVOT Vmax:         101.00 cm/s LVOT Vmean:        63.600 cm/s LVOT VTI:          0.194 m LVOT/AV VTI ratio: 0.67  AORTA Ao Root diam: 3.50 cm MITRAL VALVE MV Area (PHT): 3.89 cm    SHUNTS MV Area VTI:   2.00 cm    Systemic VTI:  0.19 m MV Peak grad:  3.3 mmHg    Systemic Diam: 2.10 cm MV Mean grad:  1.0 mmHg MV Vmax:       0.91 m/s MV Vmean:      47.8 cm/s MV Decel Time: 195 msec MV E velocity: 67.30 cm/s MV A velocity: 85.70 cm/s MV E/A ratio:  0.79 Carlyle Dolly MD Electronically signed by Carlyle Dolly MD Signature Date/Time: 01/29/2022/1:59:05 PM    Final     Cardiac Studies     Patient Profile     Louis [name] is a 66 y.o. male with a hx of HTN, tobacco abuse, hyperlipidemia  who is being seen 01/29/2022 for the evaluation of new onset afib at the request of Dr Manuella Ghazi.  Assessment & Plan    1  PAF  PT had a transient episode of afib on admit    He is being treated for pneumonia.    This may explain the occurrence  WBC was high   BUT he does not sense  ? If having at other times NOw call from floor for recurrence     WOuld continue telemetry   Can  increase dose of dilt to 60 q 8 hours       If stays in afib for more than a couple hours I would recomm anticoagulation   Can start on oral Eliquis        Overall his CHADSVASc is 2 (HTN and CAD (on CT scan)    2  HTN   BP is not optimally controlled today      NOte he is on dilt now   Hydralazine added today  Was on lisinopril 40     3  Tob   Counselled on cessation    Family would like for pt to follow up in [place] with Levada Dy  For questions or updates, please contact [place] Please consult [email or web address] for contact info under        Signed, Dorris Carnes, MD  01/30/2022, 2:50 PM

## 2022-01-30 NOTE — Evaluation (Signed)
Physical Therapy Evaluation Patient Details Name: Louis [name] MRN: [id number] DOB: Apr 03, 1956 Today's Date: 01/30/2022  History of Present Illness  Louis [name] is a 66 y.o. male with medical history significant for dyslipidemia, hypertension, ongoing tobacco abuse, and alcohol abuse who presented to the ED with complaints of productive cough that began approximately 5 days ago.  This is associated with right-sided pleuritic chest pain especially with cough.  He denies any fevers, chills, or lower extremity edema.  He states that he does not monitor his blood pressure at home, but is compliant with taking his home medications.  He apparently drinks a half a pint of hard liquor daily and smokes 3 packs of cigarettes daily.   Clinical Impression  Patient was able to complete supine to sit bed mobility with modified independence with requiring extended time to complete task. Patient was min guard/supervision assist with sit to stand and bed to chair transfers. Mild balance deficits observed but patient was able to perform tasks safely and with good technical form. Patient was able to ambulate for 150 feet with contact guard assist and supervision from PT. Patient has mild balance deficits that impacted gait pattern with drifting forward and lateral, but this was mainly observed when patient began to fatigue during assessment. Patient to be discharged home today and discharged from acute physical therapy to care of nursing for ambulation as tolerated for length of stay with recommendations stated below      Recommendations for follow up therapy are one component of a multi-disciplinary discharge planning process, led by the attending physician.  Recommendations may be updated based on patient status, additional functional criteria and insurance authorization.  Follow Up Recommendations Home health PT    Assistance Recommended at Discharge PRN  Patient can return home with the following  A little help  with walking and/or transfers;Help with stairs or ramp for entrance    Equipment Recommendations None recommended by PT  Recommendations for Other Services       Functional Status Assessment Patient has had a recent decline in their functional status and demonstrates the ability to make significant improvements in function in a reasonable and predictable amount of time.     Precautions / Restrictions Precautions Precautions: Fall Restrictions Weight Bearing Restrictions: No      Mobility  Bed Mobility Overal bed mobility: Modified Independent             General bed mobility comments: Patient able to perform supine to sit bed mobility but does so with extended time and slightly labored movement.    Transfers Overall transfer level: Modified independent Equipment used: Rolling walker (2 wheels)               General transfer comment: Patient is able to perform sit to stand and bed to chair transfer independently but does need contact guard assist for safety.    Ambulation/Gait Ambulation/Gait assistance: Min guard, Supervision Gait Distance (Feet): 150 Feet   Gait Pattern/deviations: Step-to pattern, Decreased stride length, Decreased step length - right, Decreased step length - left, Festinating, Drifts right/left Gait velocity: decreased     General Gait Details: Patient was able to ambulate for 150 feet with contact guard assist and supervision provided by PT. Patient demonstrates minor balance deficits during gait that causes drifting right/left along with minor staggering that began when patient began to experience fatigue.  Stairs            Wheelchair Mobility    Modified Rankin (Stroke Patients Only)  Balance Overall balance assessment: Needs assistance Sitting-balance support: Bilateral upper extremity supported, Feet supported Sitting balance-Leahy Scale: Good Sitting balance - Comments: Patient able to sit EOB and support himself    Standing balance support: No upper extremity supported, During functional activity Standing balance-Leahy Scale: Fair Standing balance comment: Patient demonstrates mild standing balance deficits that mainly occurs when weight shifting or moving outside base of support                             Pertinent Vitals/Pain Pain Assessment Pain Assessment: No/denies pain    Home Living Family/patient expects to be discharged to:: Private residence Living Arrangements: Spouse/significant other Available Help at Discharge: Available 24 hours/day Type of Home: House Home Access: Stairs to enter Entrance Stairs-Rails: Right Entrance Stairs-Number of Steps: 3 Alternate Level Stairs-Number of Steps: 8 Home Layout: Two level Home Equipment: Conservation officer, nature (2 wheels);Cane - single point      Prior Function Prior Level of Function : Independent/Modified Independent             Mobility Comments: Patient reports being able to ambulate at home and in community independently and no AD. Patient also reports he was driving previously ADLs Comments: Patient reports being able to perform ADL's and functional tasks independently with wife to assist prn.     Hand Dominance        Extremity/Trunk Assessment   Upper Extremity Assessment Upper Extremity Assessment: Overall WFL for tasks assessed    Lower Extremity Assessment Lower Extremity Assessment: Generalized weakness    Cervical / Trunk Assessment Cervical / Trunk Assessment: Normal  Communication   Communication: No difficulties  Cognition Arousal/Alertness: Awake/alert Behavior During Therapy: WFL for tasks assessed/performed Overall Cognitive Status: Within Functional Limits for tasks assessed                                          General Comments      Exercises     Assessment/Plan    PT Assessment Patient needs continued PT services;All further PT needs can be met in the next venue of  care  PT Problem List Decreased strength;Decreased activity tolerance;Decreased balance;Decreased mobility;Decreased coordination       PT Treatment Interventions      PT Goals (Current goals can be found in the Care Plan section)  Acute Rehab PT Goals Patient Stated Goal: return home PT Goal Formulation: With patient/family Time For Goal Achievement: 02/13/22 Potential to Achieve Goals: Good    Frequency       Co-evaluation               AM-PAC PT "6 Clicks" Mobility  Outcome Measure Help needed turning from your back to your side while in a flat bed without using bedrails?: None Help needed moving from lying on your back to sitting on the side of a flat bed without using bedrails?: None Help needed moving to and from a bed to a chair (including a wheelchair)?: A Little Help needed standing up from a chair using your arms (e.g., wheelchair or bedside chair)?: A Little Help needed to walk in hospital room?: A Little Help needed climbing 3-5 steps with a railing? : A Little 6 Click Score: 20    End of Session   Activity Tolerance: Patient tolerated treatment well;Patient limited by fatigue;No increased pain Patient left: in  chair Nurse Communication: Mobility status PT Visit Diagnosis: Unsteadiness on feet (R26.81);Other abnormalities of gait and mobility (R26.89);Muscle weakness (generalized) (M62.81)    Time: 1340-1405 PT Time Calculation (min) (ACUTE ONLY): 25 min   Charges:   PT Evaluation $PT Eval Moderate Complexity: 1 Mod PT Treatments $Therapeutic Exercise: 23-37 mins        3:21 PM, 01/30/22 Lestine Box, S/PT

## 2022-01-30 NOTE — Progress Notes (Signed)
Patient was going to be discharged at which point, he began to develop afib with RVR with HR into the 140bpm range. He was asymptomatic, but required a push of IV Cardizem to bring it under control. He transitioned back to SR, but started to become more agitated. Patient and wife are now upset about the plan to remain under observation another night. They state that he has been told multiple times that he could be having alcohol withdrawal due to his tremors and they are upset. They state that his only problem was pneumonia and heart rate elevation from the pneumonia itself, but nobody seemed to be addressing this. I have informed them that he was receiving antibiotics this whole time.   I have encouraged them to stay another night due to the risk of him going into withdrawal or having RVR from afib, but they refuse. Medications will be prescribed as per discharge summary dictated earlier today.  Total care time: 20 minutes.

## 2022-01-30 NOTE — Discharge Summary (Signed)
Physician Discharge Summary  Obe Ahlers WCH:[id number] DOB: [birth year] DOA: 01/29/2022  PCP: Dettinger, Fransisca Kaufmann, MD  Admit date: 01/29/2022  Discharge date: 01/30/2022  Admitted From:Home  Disposition:  Home  Recommendations for Outpatient Follow-up:  Follow up with PCP in 1-2 weeks Follow-up with cardiology outpatient will be scheduled Continue on 30-day Holter monitor for now with no need for anticoagulation noted at the moment Continue on Cardizem CD 120 mg daily for heart rate control Continue hydralazine 25 mg 3 times daily for blood pressure control Continue antibiotics with Omnicef and azithromycin as prescribed to complete treatment for pneumonia Continue other home medications as prior Counseled on alcohol and smoking cessation  Home Health: Yes with home health PT  Equipment/Devices: Has home walker  Discharge Condition:Stable  CODE STATUS: Full  Diet recommendation: Heart Healthy  Brief/Interim Summary: Louis [name] is a 66 y.o. male with medical history significant for dyslipidemia, hypertension, ongoing tobacco abuse, and alcohol abuse who presented to the ED with complaints of productive cough that began approximately 5 days ago.  He was also noted to have new onset atrial fibrillation with brief RVR.  He was started on Cardizem drip in the ED, but he quickly converted to sinus rhythm and had no further episodes of atrial fibrillation noted.  He was started on oral Cardizem with adequate rate control noted.  He was noted to have elevated blood pressure readings as well and this was improved with use of hydralazine.  He will continue to remain on antibiotics as prescribed for his pneumonia and is in stable condition for discharge today.  2D echocardiogram performed as noted below with no significant findings.  Discharge Diagnoses:  Principal Problem:   Atrial fibrillation with RVR ([place]) Active Problems:   HLD (hyperlipidemia)   HTN (hypertension)   Tobacco abuse    Alcohol abuse   CAP (community acquired pneumonia)  Principal discharge diagnosis: Community-acquired pneumonia with new onset atrial fibrillation.  Discharge Instructions  Discharge Instructions     Diet - low sodium heart healthy   Complete by: As directed    Diet - low sodium heart healthy   Complete by: As directed    Increase activity slowly   Complete by: As directed    Increase activity slowly   Complete by: As directed       Allergies as of 01/30/2022       Reactions   Penicillins Other (See Comments)   Occurred as child not sure what type of reaction he had   Tramadol Other (See Comments)   Patient states it made him feel like he had the flu        Medication List     TAKE these medications    Advil 200 MG tablet Generic drug: ibuprofen Take 200 mg by mouth every 6 (six) hours as needed for fever, headache or mild pain.   azithromycin 500 MG tablet Commonly known as: Zithromax Take 1 tablet (500 mg total) by mouth daily for 3 days. Take 1 tablet daily for 3 days.   cefdinir 300 MG capsule Commonly known as: OMNICEF Take 1 capsule (300 mg total) by mouth 2 (two) times daily for 5 days.   diltiazem 120 MG 24 hr capsule Commonly known as: Cardizem CD Take 1 capsule (120 mg total) by mouth daily.   feeding supplement Liqd Take 237 mLs by mouth 2 (two) times daily between meals.   guaiFENesin 600 MG 12 hr tablet Commonly known as: Mucinex Take 1 tablet (600 mg  total) by mouth 2 (two) times daily as needed for up to 10 days for cough or to loosen phlegm.   hydrALAZINE 25 MG tablet Commonly known as: APRESOLINE Take 1 tablet (25 mg total) by mouth every 8 (eight) hours.   lisinopril 40 MG tablet Commonly known as: ZESTRIL Take 1 tablet (40 mg total) by mouth daily.   pravastatin 20 MG tablet Commonly known as: PRAVACHOL Take 1 tablet (20 mg total) by mouth daily.   thiamine 100 MG tablet Take 1 tablet (100 mg total) by mouth daily. Start  taking on: Jan 31, 2022        Follow-up Information     Dettinger, Fransisca Kaufmann, MD. Schedule an appointment as soon as possible for a visit in 3 days.   Specialties: Family Medicine, Cardiology Contact information: [place] [address] [phone]         [place]. Go to.   Specialty: Cardiology Contact information: [place] [address] [phone]               Allergies  Allergen Reactions   Penicillins Other (See Comments)    Occurred as child not sure what type of reaction he had   Tramadol Other (See Comments)    Patient states it made him feel like he had the flu    Consultations: Cardiology   Procedures/Studies: DG Chest 2 View  Result Date: 01/29/2022 CLINICAL DATA:  Shortness of breath, weakness and fever. Suspected pneumonia. EXAM: CHEST - 2 VIEW COMPARISON:  CT chest no contrast 09/02/2016. FINDINGS: The lungs are slightly emphysematous. There is new demonstration of a hazy and reticulated infiltrative process in the right middle lobe distribution most likely due to pneumonia, with a small amount of similar opacity in the anterior base of the right upper lobe visible on the lateral view. The remaining lungs are generally clear. The sulci are sharp. The cardiomediastinal silhouette is unremarkable. There is calcification of the transverse aorta. Osteopenia and thoracic spondylosis. IMPRESSION: Right middle and to a lesser extent upper lobe opacities most likely due to multilobar pneumonia. Clinical correlation and radiographic follow-up recommended. Background COPD.  Aortic atherosclerosis. Electronically Signed   By: Telford Nab M.D.   On: 01/29/2022 07:45   ECHOCARDIOGRAM COMPLETE  Result Date: 01/29/2022    ECHOCARDIOGRAM REPORT   Patient Name:   [facility] Date of Exam: 01/29/2022 Medical Rec #:  [id number]    Height:       73.0 in Accession #:    [id number]   Weight:       194.0 lb Date of  Birth:  1955/10/10    BSA:          2.124 m Patient Age:    17 years     BP:           166/88 mmHg Patient Gender: M            HR:           72 bpm. Exam Location:  [facility] Procedure: 2D Echo, Cardiac Doppler and Color Doppler Indications:    Atrial Fibrillation  History:        Patient has no prior history of Echocardiogram examinations.                 Arrythmias:Atrial Fibrillation; Risk Factors:Hypertension and                 Dyslipidemia.  Sonographer:  Wenda Low Referring Phys: [id number] [place]  1. Left ventricular ejection fraction, by estimation, is 55 to 60%. The left ventricle has normal function. The left ventricle has no regional wall motion abnormalities. Left ventricular diastolic parameters are consistent with Grade I diastolic dysfunction (impaired relaxation).  2. Right ventricular systolic function is normal. The right ventricular size is normal. Tricuspid regurgitation signal is inadequate for assessing PA pressure.  3. The mitral valve is normal in structure. No evidence of mitral valve regurgitation. No evidence of mitral stenosis.  4. The aortic valve has an indeterminant number of cusps. Aortic valve regurgitation is not visualized. No aortic stenosis is present.  5. The inferior vena cava is normal in size with greater than 50% respiratory variability, suggesting right atrial pressure of 3 mmHg. FINDINGS  Left Ventricle: Left ventricular ejection fraction, by estimation, is 55 to 60%. The left ventricle has normal function. The left ventricle has no regional wall motion abnormalities. The left ventricular internal cavity size was normal in size. There is  no left ventricular hypertrophy. Left ventricular diastolic parameters are consistent with Grade I diastolic dysfunction (impaired relaxation). Normal left ventricular filling pressure. Right Ventricle: The right ventricular size is normal. Right vetricular wall thickness was not well visualized. Right  ventricular systolic function is normal. Tricuspid regurgitation signal is inadequate for assessing PA pressure. Left Atrium: Left atrial size was normal in size. Right Atrium: Right atrial size was normal in size. Pericardium: There is no evidence of pericardial effusion. Mitral Valve: The mitral valve is normal in structure. No evidence of mitral valve regurgitation. No evidence of mitral valve stenosis. MV peak gradient, 3.3 mmHg. The mean mitral valve gradient is 1.0 mmHg. Tricuspid Valve: The tricuspid valve is not well visualized. Tricuspid valve regurgitation is not demonstrated. No evidence of tricuspid stenosis. Aortic Valve: The aortic valve has an indeterminant number of cusps. Aortic valve regurgitation is not visualized. No aortic stenosis is present. Aortic valve mean gradient measures 4.0 mmHg. Aortic valve peak gradient measures 9.1 mmHg. Aortic valve area, by VTI measures 2.32 cm. Pulmonic Valve: The pulmonic valve was not well visualized. Pulmonic valve regurgitation is not visualized. No evidence of pulmonic stenosis. Aorta: The aortic root is normal in size and structure. Venous: The inferior vena cava is normal in size with greater than 50% respiratory variability, suggesting right atrial pressure of 3 mmHg. IAS/Shunts: No atrial level shunt detected by color flow Doppler.  LEFT VENTRICLE PLAX 2D LVIDd:         4.80 cm     Diastology LVIDs:         3.40 cm     LV e' medial:    6.31 cm/s LV PW:         0.90 cm     LV E/e' medial:  10.7 LV IVS:        1.00 cm     LV e' lateral:   10.70 cm/s LVOT diam:     2.10 cm     LV E/e' lateral: 6.3 LV SV:         67 LV SV Index:   32 LVOT Area:     3.46 cm  LV Volumes (MOD) LV vol d, MOD A2C: 92.3 ml LV vol d, MOD A4C: 99.8 ml LV vol s, MOD A2C: 35.0 ml LV vol s, MOD A4C: 32.7 ml LV SV MOD A2C:     57.3 ml LV SV MOD A4C:     99.8 ml LV  SV MOD BP:      62.9 ml RIGHT VENTRICLE RV Basal diam:  4.25 cm RV Mid diam:    4.40 cm RV S prime:     13.20 cm/s LEFT  ATRIUM             Index        RIGHT ATRIUM           Index LA diam:        3.70 cm 1.74 cm/m   RA Area:     23.20 cm LA Vol (A2C):   62.5 ml 29.43 ml/m  RA Volume:   75.60 ml  35.59 ml/m LA Vol (A4C):   43.0 ml 20.24 ml/m LA Biplane Vol: 53.1 ml 25.00 ml/m  AORTIC VALVE                    PULMONIC VALVE AV Area (Vmax):    2.32 cm     PV Vmax:       0.88 m/s AV Area (Vmean):   2.44 cm     PV Peak grad:  3.1 mmHg AV Area (VTI):     2.32 cm AV Vmax:           151.00 cm/s AV Vmean:          90.100 cm/s AV VTI:            0.290 m AV Peak Grad:      9.1 mmHg AV Mean Grad:      4.0 mmHg LVOT Vmax:         101.00 cm/s LVOT Vmean:        63.600 cm/s LVOT VTI:          0.194 m LVOT/AV VTI ratio: 0.67  AORTA Ao Root diam: 3.50 cm MITRAL VALVE MV Area (PHT): 3.89 cm    SHUNTS MV Area VTI:   2.00 cm    Systemic VTI:  0.19 m MV Peak grad:  3.3 mmHg    Systemic Diam: 2.10 cm MV Mean grad:  1.0 mmHg MV Vmax:       0.91 m/s MV Vmean:      47.8 cm/s MV Decel Time: 195 msec MV E velocity: 67.30 cm/s MV A velocity: 85.70 cm/s MV E/A ratio:  0.79 Carlyle Dolly MD Electronically signed by Carlyle Dolly MD Signature Date/Time: 01/29/2022/1:59:05 PM    Final      Discharge Exam: Vitals:   01/30/22 1254 01/30/22 1417  BP: (!) 182/84 (!) 167/78  Pulse: 74   Resp:    Temp: 98.1 F (36.7 C)   SpO2: 90%    Vitals:   01/30/22 0721 01/30/22 0814 01/30/22 1254 01/30/22 1417  BP: (!) 150/90 (!) 153/64 (!) 182/84 (!) 167/78  Pulse: 75  74   Resp: 20     Temp: 98.5 F (36.9 C)  98.1 F (36.7 C)   TempSrc: Oral  Oral   SpO2: 92%  90%   Weight:      Height:        General: Pt is alert, awake, not in acute distress Cardiovascular: RRR, S1/S2 +, no rubs, no gallops Respiratory: CTA bilaterally, no wheezing, no rhonchi Abdominal: Soft, NT, ND, bowel sounds + Extremities: no edema, no cyanosis    The results of significant diagnostics from this hospitalization (including imaging, microbiology, ancillary  and laboratory) are listed below for reference.     Microbiology: Recent Results (from the past 240 hour(s))  Resp Panel by RT-PCR (  Flu A&B, Covid) Nasopharyngeal Swab     Status: None   Collection Time: 01/29/22  7:25 AM   Specimen: Nasopharyngeal Swab; Nasopharyngeal(NP) swabs in vial transport medium  Result Value Ref Range Status   SARS Coronavirus 2 by RT PCR NEGATIVE NEGATIVE Final    Comment: (NOTE) SARS-CoV-2 target nucleic acids are NOT DETECTED.  The SARS-CoV-2 RNA is generally detectable in upper respiratory specimens during the acute phase of infection. The lowest concentration of SARS-CoV-2 viral copies this assay can detect is 138 copies/mL. A negative result does not preclude SARS-Cov-2 infection and should not be used as the sole basis for treatment or other patient management decisions. A negative result may occur with  improper specimen collection/handling, submission of specimen other than nasopharyngeal swab, presence of viral mutation(s) within the areas targeted by this assay, and inadequate number of viral copies(<138 copies/mL). A negative result must be combined with clinical observations, patient history, and epidemiological information. The expected result is Negative.  Fact Sheet for Patients:  EntrepreneurPulse.com.au  Fact Sheet for Healthcare Providers:  IncredibleEmployment.be  This test is no t yet approved or cleared by the [place] FDA and  has been authorized for detection and/or diagnosis of SARS-CoV-2 by FDA under an Emergency Use Authorization (EUA). This EUA will remain  in effect (meaning this test can be used) for the duration of the COVID-19 declaration under Section 564(b)(1) of the Act, 21 U.S.C.section 360bbb-3(b)(1), unless the authorization is terminated  or revoked sooner.       Influenza A by PCR NEGATIVE NEGATIVE Final   Influenza B by PCR NEGATIVE NEGATIVE Final    Comment:  (NOTE) The Xpert Xpress SARS-CoV-2/FLU/RSV plus assay is intended as an aid in the diagnosis of influenza from Nasopharyngeal swab specimens and should not be used as a sole basis for treatment. Nasal washings and aspirates are unacceptable for Xpert Xpress SARS-CoV-2/FLU/RSV testing.  Fact Sheet for Patients: EntrepreneurPulse.com.au  Fact Sheet for Healthcare Providers: IncredibleEmployment.be  This test is not yet approved or cleared by the [place] FDA and has been authorized for detection and/or diagnosis of SARS-CoV-2 by FDA under an Emergency Use Authorization (EUA). This EUA will remain in effect (meaning this test can be used) for the duration of the COVID-19 declaration under Section 564(b)(1) of the Act, 21 U.S.C. section 360bbb-3(b)(1), unless the authorization is terminated or revoked.  Performed at [facility], [address] [place], [place] [address]      Labs: BNP (last 3 results) No results for input(s): BNP in the last 8760 hours. Basic Metabolic Panel: Recent Labs  Lab 01/29/22 0730 01/30/22 0542  NA 142 143  K 3.3* 4.1  CL 102 106  CO2 31 29  GLUCOSE 117* 99  BUN 19 14  CREATININE 0.77 0.72  CALCIUM 9.1 8.7*  MG 1.6* 1.8   Liver Function Tests: No results for input(s): AST, ALT, ALKPHOS, BILITOT, PROT, ALBUMIN in the last 168 hours. No results for input(s): LIPASE, AMYLASE in the last 168 hours. No results for input(s): AMMONIA in the last 168 hours. CBC: Recent Labs  Lab 01/29/22 0730 01/30/22 0542  WBC 17.1* 12.6*  NEUTROABS 13.4*  --   HGB 15.9 14.8  HCT 45.1 42.4  MCV 93.0 94.2  PLT 264 248   Cardiac Enzymes: No results for input(s): CKTOTAL, CKMB, CKMBINDEX, TROPONINI in the last 168 hours. BNP: Invalid input(s): POCBNP CBG: No results for input(s): GLUCAP in the last 168 hours. D-Dimer No results for input(s): DDIMER in  the last 72 hours. Hgb A1c Recent Labs    01/30/22 0542   HGBA1C 5.4   Lipid Profile No results for input(s): CHOL, HDL, LDLCALC, TRIG, CHOLHDL, LDLDIRECT in the last 72 hours. Thyroid function studies Recent Labs    01/29/22 0730  TSH 0.419   Anemia work up No results for input(s): VITAMINB12, FOLATE, FERRITIN, TIBC, IRON, RETICCTPCT in the last 72 hours. Urinalysis No results found for: COLORURINE, APPEARANCEUR, [place], [place], GLUCOSEU, [place], [place], [place], PROTEINUR, UROBILINOGEN, NITRITE, LEUKOCYTESUR Sepsis Labs Invalid input(s): PROCALCITONIN,  WBC,  LACTICIDVEN Microbiology Recent Results (from the past 240 hour(s))  Resp Panel by RT-PCR (Flu A&B, Covid) Nasopharyngeal Swab     Status: None   Collection Time: 01/29/22  7:25 AM   Specimen: Nasopharyngeal Swab; Nasopharyngeal(NP) swabs in vial transport medium  Result Value Ref Range Status   SARS Coronavirus 2 by RT PCR NEGATIVE NEGATIVE Final    Comment: (NOTE) SARS-CoV-2 target nucleic acids are NOT DETECTED.  The SARS-CoV-2 RNA is generally detectable in upper respiratory specimens during the acute phase of infection. The lowest concentration of SARS-CoV-2 viral copies this assay can detect is 138 copies/mL. A negative result does not preclude SARS-Cov-2 infection and should not be used as the sole basis for treatment or other patient management decisions. A negative result may occur with  improper specimen collection/handling, submission of specimen other than nasopharyngeal swab, presence of viral mutation(s) within the areas targeted by this assay, and inadequate number of viral copies(<138 copies/mL). A negative result must be combined with clinical observations, patient history, and epidemiological information. The expected result is Negative.  Fact Sheet for Patients:  EntrepreneurPulse.com.au  Fact Sheet for Healthcare Providers:  IncredibleEmployment.be  This test is no t yet approved or cleared by the [place] FDA and  has been authorized for detection and/or diagnosis of SARS-CoV-2 by FDA under an Emergency Use Authorization (EUA). This EUA will remain  in effect (meaning this test can be used) for the duration of the COVID-19 declaration under Section 564(b)(1) of the Act, 21 U.S.C.section 360bbb-3(b)(1), unless the authorization is terminated  or revoked sooner.       Influenza A by PCR NEGATIVE NEGATIVE Final   Influenza B by PCR NEGATIVE NEGATIVE Final    Comment: (NOTE) The Xpert Xpress SARS-CoV-2/FLU/RSV plus assay is intended as an aid in the diagnosis of influenza from Nasopharyngeal swab specimens and should not be used as a sole basis for treatment. Nasal washings and aspirates are unacceptable for Xpert Xpress SARS-CoV-2/FLU/RSV testing.  Fact Sheet for Patients: EntrepreneurPulse.com.au  Fact Sheet for Healthcare Providers: IncredibleEmployment.be  This test is not yet approved or cleared by the [place] FDA and has been authorized for detection and/or diagnosis of SARS-CoV-2 by FDA under an Emergency Use Authorization (EUA). This EUA will remain in effect (meaning this test can be used) for the duration of the COVID-19 declaration under Section 564(b)(1) of the Act, 21 U.S.C. section 360bbb-3(b)(1), unless the authorization is terminated or revoked.  Performed at [facility], [address], [place], [place] [address]      Time coordinating discharge: 35 minutes  SIGNED:   Rodena Goldmann, DO [facility] 01/30/2022, 2:39 PM  If 7PM-7AM, please contact night-coverage [email or web address]

## 2022-01-30 NOTE — Progress Notes (Signed)
Patient states he wanted to leave and was not waiting around. We talked him into staying long enough to talk to Dr. Manuella Ghazi to see if he wanted to discharge so he wouldn't have to leave AMA. MD Manuella Ghazi called patient and wife on the room phone and went over the reason for him keeping them initially. MD Manuella Ghazi decided to d/c patient so he would have his medicine he needs at home. Patient's D/C paperwork completed/printed and went over with patient's significant other.

## 2022-01-31 ENCOUNTER — Telehealth: Payer: Self-pay | Admitting: Family Medicine

## 2022-02-03 NOTE — Telephone Encounter (Signed)
PATIENT CALLED BACK AND APPT MADE

## 2022-02-03 NOTE — Telephone Encounter (Signed)
L/m to [facility]

## 2022-02-05 ENCOUNTER — Ambulatory Visit: Payer: BC Managed Care – PPO | Admitting: Family Medicine

## 2022-02-05 ENCOUNTER — Ambulatory Visit (INDEPENDENT_AMBULATORY_CARE_PROVIDER_SITE_OTHER): Payer: BC Managed Care – PPO

## 2022-02-05 ENCOUNTER — Encounter: Payer: Self-pay | Admitting: Family Medicine

## 2022-02-05 VITALS — BP 129/63 | HR 76 | Temp 97.9°F | Ht 73.0 in | Wt 178.0 lb

## 2022-02-05 DIAGNOSIS — J189 Pneumonia, unspecified organism: Secondary | ICD-10-CM | POA: Diagnosis not present

## 2022-02-05 DIAGNOSIS — I4891 Unspecified atrial fibrillation: Secondary | ICD-10-CM

## 2022-02-05 NOTE — Progress Notes (Signed)
BP 129/63   Pulse 76   Temp 97.9 F (36.6 C)   Ht _0  (1.854 m)   Wt 178 lb (80.7 kg)   SpO2 91%   BMI 23.48 kg/m    Subjective:   Patient ID: Louis [name], male    DOB: 1956-05-08, 66 y.o.   MRN: [id number]  HPI: Louis [name] is a 66 y.o. male presenting on 02/05/2022 for Hospitalization Follow-up ([facility]- Pneumonia. Feels some better. Still has a little SOB)   HPI Hospital follow-up Patient was in the hospital on 01/29/2022 and discharged on 01/30/2022 for community-acquired pneumonia.  He was also found to be in A-fib with RVR.  This was new onset A-fib.  He did see cardiology in there and has follow-up with cardiology.  He did have some hypertension when he was in there as well, his blood pressure looks much better today.  He says he still feeling weak but is improving gradually just taking time.  He is still coughing some but it has been getting less and less.  He still gets somewhat short of breath with walking short distances even 25 feet but it is improving.  Relevant past medical, surgical, family and social history reviewed and updated as indicated. Interim medical history since our last visit reviewed. Allergies and medications reviewed and updated.  Review of Systems  Constitutional:  Positive for fatigue. Negative for chills and fever.  HENT:  Positive for congestion, postnasal drip, rhinorrhea and sinus pressure. Negative for ear discharge, ear pain, sneezing, sore throat and voice change.   Eyes:  Negative for pain, discharge, redness and visual disturbance.  Respiratory:  Positive for cough and shortness of breath. Negative for wheezing.   Cardiovascular:  Negative for chest pain and leg swelling.  Musculoskeletal:  Negative for back pain and gait problem.  Skin:  Negative for rash.  Neurological:  Positive for weakness.  All other systems reviewed and are negative.  Per HPI unless specifically indicated above   Allergies as of 02/05/2022       Reactions    Penicillins Other (See Comments)   Occurred as child not sure what type of reaction he had   Tramadol Other (See Comments)   Patient states it made him feel like he had the flu        Medication List        Accurate as of Feb 05, 2022  2:56 PM. If you have any questions, ask your nurse or doctor.          STOP taking these medications    hydrALAZINE 25 MG tablet Commonly known as: APRESOLINE Stopped by: Fransisca Kaufmann Khadar Monger, MD       TAKE these medications    diltiazem 120 MG 24 hr capsule Commonly known as: Cardizem CD Take 1 capsule (120 mg total) by mouth daily.   feeding supplement Liqd Take 237 mLs by mouth 2 (two) times daily between meals.   guaiFENesin 600 MG 12 hr tablet Commonly known as: Mucinex Take 1 tablet (600 mg total) by mouth 2 (two) times daily as needed for up to 10 days for cough or to loosen phlegm.   ibuprofen 200 MG tablet Commonly known as: ADVIL Take 200 mg by mouth every 6 (six) hours as needed for fever, headache or mild pain.   lisinopril 40 MG tablet Commonly known as: ZESTRIL Take 1 tablet (40 mg total) by mouth daily.   pravastatin 20 MG tablet Commonly known as: PRAVACHOL Take 1 tablet (  20 mg total) by mouth daily.   thiamine 100 MG tablet Take 1 tablet (100 mg total) by mouth daily.         Objective:   BP 129/63   Pulse 76   Temp 97.9 F (36.6 C)   Ht _0  (1.854 m)   Wt 178 lb (80.7 kg)   SpO2 91%   BMI 23.48 kg/m   Wt Readings from Last 3 Encounters:  02/05/22 178 lb (80.7 kg)  01/29/22 194 lb 0.1 oz (88 kg)  07/25/21 196 lb (88.9 kg)    Physical Exam Vitals and nursing note reviewed.  Constitutional:      General: He is not in acute distress.    Appearance: He is well-developed. He is not diaphoretic.  Eyes:     General: No scleral icterus.       Right eye: No discharge.     Conjunctiva/sclera: Conjunctivae normal.     Pupils: Pupils are equal, round, and reactive to light.  Neck:     Thyroid:  No thyromegaly.  Cardiovascular:     Rate and Rhythm: Normal rate and regular rhythm.     Heart sounds: Normal heart sounds. No murmur heard. Pulmonary:     Effort: Pulmonary effort is normal. No respiratory distress.     Breath sounds: Examination of the right-lower field reveals wheezing. Wheezing present.    Musculoskeletal:        General: Normal range of motion.     Cervical back: Neck supple.  Lymphadenopathy:     Cervical: No cervical adenopathy.  Skin:    General: Skin is warm and dry.     Findings: No rash.  Neurological:     Mental Status: He is alert and oriented to person, place, and time.     Coordination: Coordination normal.  Psychiatric:        Behavior: Behavior normal.      Assessment & Plan:   Problem List Items Addressed This Visit       Respiratory   CAP (community acquired pneumonia) - Primary   Relevant Orders   CBC with Differential/Platelet   CMP14+EGFR   DG Chest 2 View   Other Visit Diagnoses     Atrial fibrillation, unspecified type ([place])       Relevant Orders   CBC with Differential/Platelet   CMP14+EGFR   DG Chest 2 View     Will await chest x-ray read from radiology.  We will recheck blood work for kidneys and blood counts.  Follow up plan: Return if symptoms worsen or fail to improve, for 2 to 3-week follow-up for pneumonia.  Counseling provided for all of the vaccine components Orders Placed This Encounter  Procedures   DG Chest 2 View   CBC with Differential/Platelet   CMP14+EGFR    Caryl Pina, MD [facility] 02/05/2022, 2:56 PM

## 2022-02-06 DIAGNOSIS — Z029 Encounter for administrative examinations, unspecified: Secondary | ICD-10-CM

## 2022-02-06 DIAGNOSIS — Z0289 Encounter for other administrative examinations: Secondary | ICD-10-CM

## 2022-02-06 LAB — CMP14+EGFR
ALT: 49 IU/L — ABNORMAL HIGH (ref 0–44)
AST: 39 IU/L (ref 0–40)
Albumin/Globulin Ratio: 1.4 (ref 1.2–2.2)
Albumin: 4.3 g/dL (ref 3.8–4.8)
Alkaline Phosphatase: 85 IU/L (ref 44–121)
BUN/Creatinine Ratio: 15 (ref 10–24)
BUN: 12 mg/dL (ref 8–27)
Bilirubin Total: 0.4 mg/dL (ref 0.0–1.2)
CO2: 26 mmol/L (ref 20–29)
Calcium: 9.7 mg/dL (ref 8.6–10.2)
Chloride: 101 mmol/L (ref 96–106)
Creatinine, Ser: 0.82 mg/dL (ref 0.76–1.27)
Globulin, Total: 3.1 g/dL (ref 1.5–4.5)
Glucose: 92 mg/dL (ref 70–99)
Potassium: 4.3 mmol/L (ref 3.5–5.2)
Sodium: 143 mmol/L (ref 134–144)
Total Protein: 7.4 g/dL (ref 6.0–8.5)
eGFR: 98 mL/min/{1.73_m2} (ref >59)

## 2022-02-06 LAB — CBC WITH DIFFERENTIAL/PLATELET
Basophils Absolute: 0.1 10*3/uL (ref 0.0–0.2)
Basos: 1 %
EOS (ABSOLUTE): 0.2 10*3/uL (ref 0.0–0.4)
Eos: 2 %
Hematocrit: 47.1 % (ref 37.5–51.0)
Hemoglobin: 16.6 g/dL (ref 13.0–17.7)
Immature Grans (Abs): 0.2 10*3/uL — ABNORMAL HIGH (ref 0.0–0.1)
Immature Granulocytes: 2 %
Lymphocytes Absolute: 3.2 10*3/uL — ABNORMAL HIGH (ref 0.7–3.1)
Lymphs: 29 %
MCH: 33.3 pg — ABNORMAL HIGH (ref 26.6–33.0)
MCHC: 35.2 g/dL (ref 31.5–35.7)
MCV: 95 fL (ref 79–97)
Monocytes Absolute: 0.5 10*3/uL (ref 0.1–0.9)
Monocytes: 5 %
Neutrophils Absolute: 7.1 10*3/uL — ABNORMAL HIGH (ref 1.4–7.0)
Neutrophils: 61 %
Platelets: 376 10*3/uL (ref 150–450)
RBC: 4.98 x10E6/uL (ref 4.14–5.80)
RDW: 12.6 % (ref 11.6–15.4)
WBC: 11.2 10*3/uL — ABNORMAL HIGH (ref 3.4–10.8)

## 2022-02-11 ENCOUNTER — Telehealth: Payer: Self-pay | Admitting: Family Medicine

## 2022-02-12 NOTE — Telephone Encounter (Signed)
Attempted to contact patient - left detailed message. Per cathy it is on providers desk and we will fax soon as we get it back. Should be done by the end of the week.

## 2022-02-12 NOTE — Telephone Encounter (Signed)
Pt calling again about his fmla. He says ne needs to have in by end of this week. Please call back

## 2022-02-14 NOTE — Telephone Encounter (Signed)
Pt aware STD ppw has been faxed this morning & confirmation rcvd & copy attached to his copy & FMLA does not have fax # for me to send to, both at front desk for him to pick up.

## 2022-02-17 ENCOUNTER — Encounter: Payer: Self-pay | Admitting: Emergency Medicine

## 2022-02-20 ENCOUNTER — Ambulatory Visit (INDEPENDENT_AMBULATORY_CARE_PROVIDER_SITE_OTHER): Payer: BC Managed Care – PPO

## 2022-02-20 ENCOUNTER — Encounter: Payer: Self-pay | Admitting: Family Medicine

## 2022-02-20 ENCOUNTER — Ambulatory Visit: Payer: BC Managed Care – PPO | Admitting: Family Medicine

## 2022-02-20 VITALS — BP 148/54 | HR 83 | Temp 98.6°F | Ht 73.0 in | Wt 180.0 lb

## 2022-02-20 DIAGNOSIS — J189 Pneumonia, unspecified organism: Secondary | ICD-10-CM | POA: Diagnosis not present

## 2022-02-20 NOTE — Progress Notes (Signed)
BP (!) 148/54   Pulse 83   Temp 98.6 F (37 C)   Ht '6\' 1"'$  (1.854 m)   Wt 180 lb (81.6 kg)   SpO2 94%   BMI 23.75 kg/m    Subjective:   Patient ID: Louis [name], male    DOB: August 01, 1956, 66 y.o.   MRN: [id number]  HPI: Louis [name] is a 66 y.o. male presenting on 02/20/2022 for Pneumonia (2-3 week follow up)   HPI Patient is coming in today for pneumonia follow-up. He says he is gradually feeling better and getting some energy back but he still feels somewhat short of breath.  He is gradually improving.  He says not having any coughing or fevers or wheezing.  This mostly with exertion.  It is improving though.  Relevant past medical, surgical, family and social history reviewed and updated as indicated. Interim medical history since our last visit reviewed. Allergies and medications reviewed and updated.  Review of Systems  Constitutional:  Negative for chills and fever.  HENT:  Negative for congestion, ear discharge, ear pain, postnasal drip, rhinorrhea, sinus pressure, sneezing, sore throat and voice change.   Eyes:  Negative for pain, discharge, redness and visual disturbance.  Respiratory:  Positive for shortness of breath. Negative for cough and wheezing.   Cardiovascular:  Negative for chest pain and leg swelling.  Musculoskeletal:  Negative for gait problem.  Skin:  Negative for rash.  All other systems reviewed and are negative.   Per HPI unless specifically indicated above   Allergies as of 02/20/2022       Reactions   Penicillins Other (See Comments)   Occurred as child not sure what type of reaction he had   Tramadol Other (See Comments)   Patient states it made him feel like he had the flu        Medication List        Accurate as of February 20, 2022 11:45 AM. If you have any questions, ask your nurse or doctor.          diltiazem 120 MG 24 hr capsule Commonly known as: Cardizem CD Take 1 capsule (120 mg total) by mouth daily.   feeding supplement  Liqd Take 237 mLs by mouth 2 (two) times daily between meals.   ibuprofen 200 MG tablet Commonly known as: ADVIL Take 200 mg by mouth every 6 (six) hours as needed for fever, headache or mild pain.   lisinopril 40 MG tablet Commonly known as: ZESTRIL Take 1 tablet (40 mg total) by mouth daily.   pravastatin 20 MG tablet Commonly known as: PRAVACHOL Take 1 tablet (20 mg total) by mouth daily.   thiamine 100 MG tablet Take 1 tablet (100 mg total) by mouth daily.         Objective:   BP (!) 148/54   Pulse 83   Temp 98.6 F (37 C)   Ht '6\' 1"'$  (1.854 m)   Wt 180 lb (81.6 kg)   SpO2 94%   BMI 23.75 kg/m   Wt Readings from Last 3 Encounters:  02/20/22 180 lb (81.6 kg)  02/05/22 178 lb (80.7 kg)  01/29/22 194 lb 0.1 oz (88 kg)    Physical Exam Vitals and nursing note reviewed.  Constitutional:      General: He is not in acute distress.    Appearance: He is well-developed. He is not diaphoretic.  HENT:     Nose: Mucosal edema present.     Right Sinus: Maxillary  sinus tenderness present. No frontal sinus tenderness.     Left Sinus: Maxillary sinus tenderness present. No frontal sinus tenderness.     Mouth/Throat:     Pharynx: Uvula midline.     Tonsils: No tonsillar abscesses.  Eyes:     General: No scleral icterus.    Conjunctiva/sclera: Conjunctivae normal.  Neck:     Thyroid: No thyromegaly.  Cardiovascular:     Rate and Rhythm: Normal rate and regular rhythm.     Heart sounds: Normal heart sounds. No murmur heard. Pulmonary:     Effort: Pulmonary effort is normal. No respiratory distress.     Breath sounds: Normal breath sounds. No wheezing, rhonchi or rales.  Chest:     Chest wall: No tenderness.  Musculoskeletal:     Cervical back: Neck supple.  Lymphadenopathy:     Cervical: No cervical adenopathy.  Skin:    General: Skin is warm and dry.     Findings: No rash.  Neurological:     Mental Status: He is alert and oriented to person, place, and time.      Coordination: Coordination normal.  Psychiatric:        Behavior: Behavior normal.     Chest x-ray: Await final read from radiology, will look for clearance.  Assessment & Plan:   Problem List Items Addressed This Visit       Respiratory   CAP (community acquired pneumonia) - Primary   Relevant Orders   DG Chest 2 View  Seems like patient is improving, wrote him to go back to work on the 19th.  He is cleared from here.   Follow up plan: Return if symptoms worsen or fail to improve.  Counseling provided for all of the vaccine components Orders Placed This Encounter  Procedures   DG Chest [place], MD [facility] 02/20/2022, 11:45 AM

## 2022-09-04 ENCOUNTER — Ambulatory Visit (HOSPITAL_COMMUNITY): Admission: RE | Admit: 2022-09-04 | Payer: BC Managed Care – PPO | Source: Ambulatory Visit

## 2022-09-04 ENCOUNTER — Encounter: Payer: Self-pay | Admitting: Family Medicine

## 2022-09-04 ENCOUNTER — Encounter (HOSPITAL_COMMUNITY): Payer: Self-pay

## 2022-09-04 ENCOUNTER — Ambulatory Visit (INDEPENDENT_AMBULATORY_CARE_PROVIDER_SITE_OTHER): Payer: BC Managed Care – PPO | Admitting: Family Medicine

## 2022-09-04 VITALS — BP 138/88 | HR 68 | Temp 97.5°F | Ht 73.0 in | Wt 195.0 lb

## 2022-09-04 DIAGNOSIS — Z0001 Encounter for general adult medical examination with abnormal findings: Secondary | ICD-10-CM | POA: Diagnosis not present

## 2022-09-04 DIAGNOSIS — R590 Localized enlarged lymph nodes: Secondary | ICD-10-CM

## 2022-09-04 DIAGNOSIS — Z0000 Encounter for general adult medical examination without abnormal findings: Secondary | ICD-10-CM

## 2022-09-04 DIAGNOSIS — E782 Mixed hyperlipidemia: Secondary | ICD-10-CM

## 2022-09-04 DIAGNOSIS — Z23 Encounter for immunization: Secondary | ICD-10-CM | POA: Diagnosis not present

## 2022-09-04 DIAGNOSIS — I10 Essential (primary) hypertension: Secondary | ICD-10-CM

## 2022-09-04 MED ORDER — LISINOPRIL 40 MG PO TABS: 40.0000 mg | ORAL_TABLET | Freq: Every day | ORAL | 3 refills | Status: DC

## 2022-09-04 MED ORDER — PRAVASTATIN SODIUM 20 MG PO TABS: 20.0000 mg | ORAL_TABLET | Freq: Every day | ORAL | 3 refills | Status: DC

## 2022-09-04 NOTE — Progress Notes (Signed)
BP 138/88   Pulse 68   Temp (!) 97.5 F (36.4 C)   Ht _0  (1.854 m)   Wt 195 lb (88.5 kg)   SpO2 95%   BMI 25.73 kg/m    Subjective:   Patient ID: Louis [name], male    DOB: 11-17-1955, 66 y.o.   MRN: [id number]  HPI: Louis [name] is a 66 y.o. male presenting on 09/04/2022 for Medical Management of Chronic Issues (CPE)   HPI Physical exam Patient denies any chest pain, shortness of breath, headaches or vision issues, abdominal complaints, diarrhea, nausea, vomiting.  One of his biggest complaints is that he still has a large nodule on his left lower jaw just below his ear.  Hypertension Patient is currently on diltiazem and lisinopril, and their blood pressure today is 138/88. Patient denies any lightheadedness or dizziness. Patient denies headaches, blurred vision, chest pains, shortness of breath, or weakness. Denies any side effects from medication and is content with current medication.  Hyperlipidemia Patient is coming in for recheck of his hyperlipidemia. The patient is currently taking pravastatin. They deny any issues with myalgias or history of liver damage from it. They deny any focal numbness or weakness or chest pain.   Relevant past medical, surgical, family and social history reviewed and updated as indicated. Interim medical history since our last visit reviewed. Allergies and medications reviewed and updated.  Review of Systems  Constitutional:  Negative for chills and fever.  HENT:  Negative for ear pain and tinnitus.   Eyes:  Negative for pain and discharge.  Respiratory:  Negative for cough, shortness of breath and wheezing.   Cardiovascular:  Negative for chest pain, palpitations and leg swelling.  Gastrointestinal:  Negative for abdominal pain, blood in stool, constipation and diarrhea.  Genitourinary:  Negative for dysuria and hematuria.  Musculoskeletal:  Negative for back pain, gait problem and myalgias.  Skin:  Negative for rash.  Neurological:   Negative for dizziness, weakness and headaches.  Psychiatric/Behavioral:  Negative for suicidal ideas.   All other systems reviewed and are negative.   Per HPI unless specifically indicated above   Allergies as of 09/04/2022       Reactions   Penicillins Other (See Comments)   Occurred as child not sure what type of reaction he had   Tramadol Other (See Comments)   Patient states it made him feel like he had the flu        Medication List        Accurate as of September 04, 2022  3:25 PM. If you have any questions, ask your nurse or doctor.          STOP taking these medications    feeding supplement Liqd Stopped by: Fransisca Kaufmann Eliakim Tendler, MD       TAKE these medications    diltiazem 120 MG 24 hr capsule Commonly known as: Cardizem CD Take 1 capsule (120 mg total) by mouth daily.   ibuprofen 200 MG tablet Commonly known as: ADVIL Take 200 mg by mouth every 6 (six) hours as needed for fever, headache or mild pain.   lisinopril 40 MG tablet Commonly known as: ZESTRIL Take 1 tablet (40 mg total) by mouth daily.   pravastatin 20 MG tablet Commonly known as: PRAVACHOL Take 1 tablet (20 mg total) by mouth daily.         Objective:   BP 138/88   Pulse 68   Temp (!) 97.5 F (36.4 C)   Ht  _0  (1.854 m)   Wt 195 lb (88.5 kg)   SpO2 95%   BMI 25.73 kg/m   Wt Readings from Last 3 Encounters:  09/04/22 195 lb (88.5 kg)  02/20/22 180 lb (81.6 kg)  02/05/22 178 lb (80.7 kg)    Physical Exam Vitals reviewed.  Constitutional:      General: He is not in acute distress.    Appearance: He is well-developed. He is not diaphoretic.  HENT:     Right Ear: External ear normal.     Left Ear: External ear normal.     Nose: Nose normal.     Mouth/Throat:     Pharynx: No oropharyngeal exudate.  Eyes:     General: No scleral icterus.    Conjunctiva/sclera: Conjunctivae normal.  Neck:     Thyroid: No thyromegaly.  Cardiovascular:     Rate and Rhythm: Normal  rate and regular rhythm.     Heart sounds: Normal heart sounds. No murmur heard. Pulmonary:     Effort: Pulmonary effort is normal. No respiratory distress.     Breath sounds: Normal breath sounds. No wheezing.  Abdominal:     General: Bowel sounds are normal. There is no distension.     Palpations: Abdomen is soft.     Tenderness: There is no abdominal tenderness. There is no guarding or rebound.  Musculoskeletal:        General: No swelling. Normal range of motion.     Cervical back: Neck supple.  Lymphadenopathy:     Cervical: Cervical adenopathy (Left anterior preauricular lymphadenopathy, 1 x 2 cm firm mobile, not tender nodule) present.  Skin:    General: Skin is warm and dry.     Findings: No rash.  Neurological:     Mental Status: He is alert and oriented to person, place, and time.     Coordination: Coordination normal.  Psychiatric:        Behavior: Behavior normal.     Assessment & Plan:   Problem List Items Addressed This Visit       Cardiovascular and Mediastinum   HTN (hypertension)   Relevant Medications   pravastatin (PRAVACHOL) 20 MG tablet   lisinopril (ZESTRIL) 40 MG tablet     Other   HLD (hyperlipidemia)   Relevant Medications   pravastatin (PRAVACHOL) 20 MG tablet   lisinopril (ZESTRIL) 40 MG tablet   Other Relevant Orders   CBC with Differential/Platelet   CMP14+EGFR   Lipid panel   Other Visit Diagnoses     Physical exam    -  Primary   Relevant Orders   CBC with Differential/Platelet   CMP14+EGFR   Lipid panel   Essential hypertension       Relevant Medications   pravastatin (PRAVACHOL) 20 MG tablet   lisinopril (ZESTRIL) 40 MG tablet   Other Relevant Orders   CBC with Differential/Platelet   CMP14+EGFR   Lipid panel   Preauricular lymphadenopathy       Relevant Orders   CT Soft Tissue Neck W Contrast       Patient says he has a lymph node on his left jawline just anterior to his ear that is been there close to a year.  Says  it started after he had pneumonia but that has not gone away Follow up plan: Return in about 6 months (around 03/06/2023), or if symptoms worsen or fail to improve, for htn and hld.  Counseling provided for all of the vaccine components Orders Placed This  Encounter  Procedures   CT Soft Tissue Neck W Contrast   CBC with Differential/Platelet   CMP14+EGFR   Lipid panel    Caryl Pina, MD [facility] 09/04/2022, 3:25 PM

## 2022-09-05 ENCOUNTER — Telehealth: Payer: Self-pay | Admitting: *Deleted

## 2022-09-05 ENCOUNTER — Other Ambulatory Visit: Payer: Self-pay | Admitting: Family Medicine

## 2022-09-05 ENCOUNTER — Ambulatory Visit (HOSPITAL_COMMUNITY)
Admission: RE | Admit: 2022-09-05 | Discharge: 2022-09-05 | Disposition: A | Discharge status: Home / Self Care | Payer: BC Managed Care – PPO | Source: Ambulatory Visit | Attending: Family Medicine | Admitting: Family Medicine

## 2022-09-05 ENCOUNTER — Encounter (HOSPITAL_COMMUNITY): Payer: Self-pay

## 2022-09-05 DIAGNOSIS — R590 Localized enlarged lymph nodes: Secondary | ICD-10-CM | POA: Insufficient documentation

## 2022-09-05 DIAGNOSIS — R221 Localized swelling, mass and lump, neck: Secondary | ICD-10-CM | POA: Diagnosis not present

## 2022-09-05 LAB — CMP14+EGFR
ALT: 22 IU/L (ref 0–44)
AST: 30 IU/L (ref 0–40)
Albumin/Globulin Ratio: 1.6 (ref 1.2–2.2)
Albumin: 4.2 g/dL (ref 3.9–4.9)
Alkaline Phosphatase: 87 IU/L (ref 44–121)
BUN/Creatinine Ratio: 11 (ref 10–24)
BUN: 10 mg/dL (ref 8–27)
Bilirubin Total: 0.4 mg/dL (ref 0.0–1.2)
CO2: 28 mmol/L (ref 20–29)
Calcium: 9.3 mg/dL (ref 8.6–10.2)
Chloride: 99 mmol/L (ref 96–106)
Creatinine, Ser: 0.9 mg/dL (ref 0.76–1.27)
Globulin, Total: 2.7 g/dL (ref 1.5–4.5)
Glucose: 103 mg/dL — ABNORMAL HIGH (ref 70–99)
Potassium: 4 mmol/L (ref 3.5–5.2)
Sodium: 142 mmol/L (ref 134–144)
Total Protein: 6.9 g/dL (ref 6.0–8.5)
eGFR: 95 mL/min/{1.73_m2} (ref >59)

## 2022-09-05 LAB — CBC WITH DIFFERENTIAL/PLATELET
Basophils Absolute: 0.1 10*3/uL (ref 0.0–0.2)
Basos: 1 %
EOS (ABSOLUTE): 0.2 10*3/uL (ref 0.0–0.4)
Eos: 2 %
Hematocrit: 47.3 % (ref 37.5–51.0)
Hemoglobin: 16 g/dL (ref 13.0–17.7)
Immature Grans (Abs): 0.1 10*3/uL (ref 0.0–0.1)
Immature Granulocytes: 1 %
Lymphocytes Absolute: 2.8 10*3/uL (ref 0.7–3.1)
Lymphs: 30 %
MCH: 33.3 pg — ABNORMAL HIGH (ref 26.6–33.0)
MCHC: 33.8 g/dL (ref 31.5–35.7)
MCV: 98 fL — ABNORMAL HIGH (ref 79–97)
Monocytes Absolute: 0.7 10*3/uL (ref 0.1–0.9)
Monocytes: 8 %
Neutrophils Absolute: 5.5 10*3/uL (ref 1.4–7.0)
Neutrophils: 58 %
Platelets: 295 10*3/uL (ref 150–450)
RBC: 4.81 x10E6/uL (ref 4.14–5.80)
RDW: 12.1 % (ref 11.6–15.4)
WBC: 9.3 10*3/uL (ref 3.4–10.8)

## 2022-09-05 LAB — LIPID PANEL
Chol/HDL Ratio: 2.4 ratio (ref 0.0–5.0)
Cholesterol, Total: 156 mg/dL (ref 100–199)
HDL: 65 mg/dL (ref >39)
LDL Chol Calc (NIH): 72 mg/dL (ref 0–99)
Triglycerides: 109 mg/dL (ref 0–149)
VLDL Cholesterol Cal: 19 mg/dL (ref 5–40)

## 2022-09-05 MED ORDER — HYDROXYZINE PAMOATE 25 MG PO CAPS: 25.0000 mg | ORAL_CAPSULE | Freq: Three times a day (TID) | ORAL | 1 refills | Status: DC | PRN

## 2022-09-05 MED ORDER — IOHEXOL 300 MG/ML  SOLN
75.0000 mL | Freq: Once | INTRAMUSCULAR | Status: AC | PRN
  Administered 2022-09-05: 75 mL via INTRAVENOUS

## 2022-09-05 NOTE — Telephone Encounter (Signed)
Have you found anything more about this because it shows like they are saying something about it was not covered?

## 2022-09-05 NOTE — Progress Notes (Signed)
Left parotid or lymph node, possible cancer, placed referral to ENT urgently   Do not tell the patient about the urgent referral yet, I would like to talk to them first, please call him and have him come in before 5 with his significant other if he would like to discuss CT scan results.  If he insists that he wants to do it over the phone or cannot come in then please let me know and I will call him.  Sent medicine for anxiety for him

## 2022-09-05 NOTE — Telephone Encounter (Signed)
Imaging  called [employer] line to inform that patients STAT CT is not covered by patients plan.

## 2022-09-05 NOTE — Progress Notes (Signed)
Patient coming in 

## 2022-09-05 NOTE — Telephone Encounter (Signed)
Patient sent to [facility] today to be worked in per [facility].

## 2022-09-09 DIAGNOSIS — R590 Localized enlarged lymph nodes: Secondary | ICD-10-CM | POA: Diagnosis not present

## 2022-09-09 DIAGNOSIS — D119 Benign neoplasm of major salivary gland, unspecified: Secondary | ICD-10-CM | POA: Diagnosis not present

## 2022-09-25 NOTE — H&P (Signed)
HPI:   Louis [name] is a 67 y.o. male who presents as a consult Patient.   Referring Provider: Dettinger, Gaetana Michaelis*  Chief complaint: Left cervical lymph nodes.  HPI: About a year ago he noticed a lump in the left upper neck area. On his recent annual physical this was noted to be about the same. Otherwise in pretty good health. He had pneumonia several months ago. He quit smoking last month. No history of skin cancer.  PMH/Meds/All/SocHx/FamHx/ROS:   Past Medical History:  Diagnosis Date   Hypertension   Past Surgical History:  Procedure Laterality Date   TONSILLECTOMY  childhood   TOTAL HIP ARTHROPLASTY Bilateral 06/30/2019  Procedure: TOTAL HIP REPLACEMENT-ANTERIOR APPROACH; Surgeon: Peggyann Shoals, MD; Location: [phone] MAIN OR; Service: Orthopedics; Laterality: Bilateral;   No family history of bleeding disorders, wound healing problems or difficulty with anesthesia.   Social History   Socioeconomic History   Marital status: Married  Spouse name: Not on file   Number of children: Not on file   Years of education: Not on file   Highest education level: Not on file  Occupational History   Not on file  Tobacco Use   Smoking status: Former  Types: Cigarettes  Quit date: 06/03/2019  Years since quitting: 3.2   Smokeless tobacco: Never  Substance and Sexual Activity   Alcohol use: Yes  Comment: occ   Drug use: Never   Sexual activity: Not on file  Other Topics Concern   Not on file  Social History Narrative   Not on file   Social Determinants of Health   Food Insecurity: Not on file  Transportation Needs: Not on file  Living Situation: Not on file   Current Outpatient Medications:   hydrOXYzine pamoate (VISTARIL) 25 mg capsule, Take 1 capsule (25 mg total) by mouth., Disp: , Rfl:   lisinopriL (PRINIVIL,ZESTRIL) 40 MG tablet, TAKE 1 TABLET BY MOUTH EVERY DAY, Disp: , Rfl:   pravastatin (PRAVACHOL) 20 MG tablet, Take 1 tablet (20 mg total) by mouth  daily., Disp: , Rfl:   dilTIAZem (CARTIA XT, CARDIZEM CD) 120 MG 24 hr capsule, Take 1 capsule (120 mg total) by mouth daily., Disp: , Rfl:   A complete ROS was performed with pertinent positives/negatives noted in the HPI. The remainder of the ROS are negative.   Physical Exam:   Ht 1.803 m ('5\' 11"'$ )  Wt 89.8 kg (198 lb)  BMI 27.62 kg/m   General: Healthy and alert, in no distress, breathing easily. Normal affect. In a pleasant mood. Head: Normocephalic, atraumatic. No masses, or scars. Eyes: Pupils are equal, and reactive to light. Vision is grossly intact. No spontaneous or gaze nystagmus. Ears: Ear canals are clear. Tympanic membranes are intact, with normal landmarks and the middle ears are clear and healthy. Hearing: Grossly normal. Nose: Nasal cavities are clear with healthy mucosa, no polyps or exudate. Airways are patent. Face: No masses or scars, facial nerve function is symmetric. Oral Cavity: No mucosal abnormalities are noted. Tongue with normal mobility. Dentition appears healthy. Oropharynx: Tonsils are symmetric. There are no mucosal masses identified. Tongue base appears normal and healthy. Larynx/Hypopharynx: deferred Chest: Deferred Neck: There is an elongated soft rubbery mass involving the inferior aspect of the left parotid, no other palpable masses, no cervical adenopathy, no thyroid nodules or enlargement. Neuro: Cranial nerves II-XII with normal function. Balance: Normal gate. Other findings: none.  Independent Review of Additional Tests or Records:  CT neck:  IMPRESSION:  4.3 x 2.2  cm ovoid, heterogeneous mass along the inferior aspect of  the left parotid gland. This may reflect an enlarged, pathologic  left level 2 lymph node (nodal metastatic disease, lymphoma) or a  primary parotid neoplasm. ENT consultation is recommended and direct  tissue sampling should be considered.   Procedures:  Procedure Note:  Indications for procedure: neck  mass  Details of the procedure were discussed with the patient and all questions were answered.  Procedure:  2% xylocaine with epinephrine was infiltrated into the overlying skin. First pass was made with a 25 gauge needle and 10 cc syringe. Second pass was made with a 22 gauge needle. Specimen was placed on microscopic slides and air-dried. Additional material was placed in Cytolyte solution for cell block preparation. A third pass was made with a 22 guage needle and sample was added to the cytolyte solution.  A bandage was applied.   He tolerated the procedure well. Results will be discussed when available.  Impression & Plans:  Left neck mass, most likely parotid. CT report is consistent with that as well. FNA performed. Will discuss results when available next week.

## 2022-09-26 ENCOUNTER — Encounter (HOSPITAL_COMMUNITY): Payer: Self-pay | Admitting: Otolaryngology

## 2022-09-26 ENCOUNTER — Other Ambulatory Visit: Payer: Self-pay

## 2022-09-26 NOTE — Progress Notes (Signed)
Mr. Rago denies chest pain or shortness of breath. Patient denies having any s/s of Covid in his household, also denies any known exposure to Covid.  Mr. Renstrom PCP is Dr Dettering.   Mr. Millikan, in May, was in the ED with Pneumonia, patient went into Afib and remained for 3 hours,patient then self converted. There was mention of patient seeing a cardiologist, this was also mentioned in After care instructions. Mr. Valadez stated he was not aware. Patient was started on diltiazem,no blood thinner.  I asked Karoline Caldwell. PA-C to review.

## 2022-09-26 NOTE — Anesthesia Preprocedure Evaluation (Signed)
Anesthesia Evaluation  Patient identified by MRN, date of birth, ID band Patient awake    Reviewed: Allergy & Precautions, H&P , NPO status , Patient's Chart, lab work & pertinent test results  Airway Mallampati: II  TM Distance: >3 FB Neck ROM: Full    Dental no notable dental hx. (+) Teeth Intact, Dental Advisory Given   Pulmonary former smoker   Pulmonary exam normal breath sounds clear to auscultation       Cardiovascular hypertension, Pt. on medications + dysrhythmias Atrial Fibrillation  Rhythm:Regular Rate:Normal     Neuro/Psych   Anxiety     negative neurological ROS     GI/Hepatic negative GI ROS, Neg liver ROS,,,  Endo/Other  negative endocrine ROS    Renal/GU negative Renal ROS  negative genitourinary   Musculoskeletal  (+) Arthritis , Osteoarthritis,    Abdominal   Peds  Hematology negative hematology ROS (+)   Anesthesia Other Findings   Reproductive/Obstetrics negative OB ROS                             Anesthesia Physical Anesthesia Plan  ASA: 3  Anesthesia Plan: General   Post-op Pain Management: Tylenol PO (pre-op)*   Induction: Intravenous  PONV Risk Score and Plan: 3 and Ondansetron, Dexamethasone and Midazolam  Airway Management Planned: Oral ETT  Additional Equipment:   Intra-op Plan:   Post-operative Plan: Extubation in OR  Informed Consent: I have reviewed the patients History and Physical, chart, labs and discussed the procedure including the risks, benefits and alternatives for the proposed anesthesia with the patient or authorized representative who has indicated his/her understanding and acceptance.     Dental advisory given  Plan Discussed with: CRNA  Anesthesia Plan Comments: (PAT note by Karoline Caldwell, PA-C: 67 year old male with medical history significant for dyslipidemia, hypertension, ongoing tobacco abuse, and alcohol abuse.  Patient  was in the hospital on 01/29/2022 and discharged on 01/30/2022 for community-acquired pneumonia. He was also found to be in A-fib with RVR. This was new onset A-fib.  He initially converted in the ED with Cardizem drip.  He had another episode of A-fib with RVR on day of discharge 01/30/2022 and again converted with IV Cardizem.  He was not started on anticoagulation.  Echocardiogram 01/29/2022 showed EF 55 to 60%, grade 1 DD no significant valvular abnormalities.  He was supposed to follow-up with cardiology after discharge but has not done so.  He has followed up on multiple occasions with his PCP Dr. Warrick Parisian and has been continued on Cardizem.  He was last seen 09/04/2022 and denied any cardiovascular complaints.  Heart rate at that time was 68 and blood pressure 138/88.  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  He was referred urgently to ENT for further evaluation management.  Pt will need DOS labs and eval.  EKG 01/29/22: Sinus rhythm. Rate 63. Atrial premature complexes. Consider right ventricular hypertrophy  TTE 01/29/22: 1. Left ventricular ejection fraction, by estimation, is 55 to 60%. The  left ventricle has normal function. The left ventricle has no regional  wall motion abnormalities. Left ventricular diastolic parameters are  consistent with Grade I diastolic  dysfunction (impaired relaxation).  2. Right ventricular systolic function is normal. The right ventricular  size is normal. Tricuspid regurgitation signal is inadequate for assessing  PA  pressure.  3. The mitral valve is normal in structure. No evidence of mitral valve  regurgitation. No evidence of mitral stenosis.  4. The aortic valve has an indeterminant number of cusps. Aortic valve  regurgitation is not visualized. No aortic stenosis is present.  5. The inferior vena cava is normal in size with greater than  50%  respiratory variability, suggesting right atrial pressure of 3 mmHg.   )        Anesthesia Quick Evaluation

## 2022-09-26 NOTE — Progress Notes (Signed)
Anesthesia Chart Review: Same-day workup  67 year old male with medical history significant for dyslipidemia, hypertension, ongoing tobacco abuse, and alcohol abuse.  Patient was in the hospital on 01/29/2022 and discharged on 01/30/2022 for community-acquired pneumonia. He was also found to be in A-fib with RVR. This was new onset A-fib.  He initially converted in the ED with Cardizem drip.  He had another episode of A-fib with RVR on day of discharge 01/30/2022 and again converted with IV Cardizem.  He was not started on anticoagulation.  Echocardiogram 01/29/2022 showed EF 55 to 60%, grade 1 DD no significant valvular abnormalities.  He was supposed to follow-up with cardiology after discharge but has not done so.  He has followed up on multiple occasions with his PCP Dr. Warrick Parisian and has been continued on Cardizem.  He was last seen 09/04/2022 and denied any cardiovascular complaints.  Heart rate at that time was 68 and blood pressure 138/88.  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  He was referred urgently to ENT for further evaluation management.  Pt will need DOS labs and eval.  EKG 01/29/22: Sinus rhythm. Rate 63. Atrial premature complexes. Consider right ventricular hypertrophy  TTE 01/29/22:  1. Left ventricular ejection fraction, by estimation, is 55 to 60%. The  left ventricle has normal function. The left ventricle has no regional  wall motion abnormalities. Left ventricular diastolic parameters are  consistent with Grade I diastolic  dysfunction (impaired relaxation).   2. Right ventricular systolic function is normal. The right ventricular  size is normal. Tricuspid regurgitation signal is inadequate for assessing  PA pressure.   3. The mitral valve is normal in structure. No evidence of mitral valve  regurgitation. No evidence of mitral stenosis.   4. The aortic valve has an  indeterminant number of cusps. Aortic valve  regurgitation is not visualized. No aortic stenosis is present.   5. The inferior vena cava is normal in size with greater than 50%  respiratory variability, suggesting right atrial pressure of 3 mmHg.    Wynonia Musty [facility] Short Stay Center/Anesthesiology Phone [phone] 09/26/2022 3:18 PM

## 2022-09-27 ENCOUNTER — Other Ambulatory Visit: Payer: Self-pay | Admitting: Family Medicine

## 2022-09-27 DIAGNOSIS — R590 Localized enlarged lymph nodes: Secondary | ICD-10-CM

## 2022-09-29 ENCOUNTER — Ambulatory Visit (HOSPITAL_BASED_OUTPATIENT_CLINIC_OR_DEPARTMENT_OTHER): Payer: PRIVATE HEALTH INSURANCE | Admitting: Physician Assistant

## 2022-09-29 ENCOUNTER — Ambulatory Visit (HOSPITAL_COMMUNITY)
Admission: RE | Admit: 2022-09-29 | Discharge: 2022-09-29 | Disposition: A | Discharge status: Home / Self Care | Payer: PRIVATE HEALTH INSURANCE | Attending: Otolaryngology | Admitting: Otolaryngology

## 2022-09-29 ENCOUNTER — Ambulatory Visit (HOSPITAL_COMMUNITY): Payer: PRIVATE HEALTH INSURANCE | Admitting: Physician Assistant

## 2022-09-29 ENCOUNTER — Other Ambulatory Visit: Payer: Self-pay

## 2022-09-29 ENCOUNTER — Encounter (HOSPITAL_COMMUNITY)
Admission: RE | Disposition: A | Discharge status: Home / Self Care | Payer: Self-pay | Source: Home / Self Care | Attending: Otolaryngology

## 2022-09-29 ENCOUNTER — Encounter (HOSPITAL_COMMUNITY): Payer: Self-pay | Admitting: Otolaryngology

## 2022-09-29 DIAGNOSIS — Z87891 Personal history of nicotine dependence: Secondary | ICD-10-CM | POA: Insufficient documentation

## 2022-09-29 DIAGNOSIS — I4891 Unspecified atrial fibrillation: Secondary | ICD-10-CM | POA: Insufficient documentation

## 2022-09-29 DIAGNOSIS — R220 Localized swelling, mass and lump, head: Secondary | ICD-10-CM | POA: Diagnosis present

## 2022-09-29 DIAGNOSIS — D110 Benign neoplasm of parotid gland: Secondary | ICD-10-CM | POA: Insufficient documentation

## 2022-09-29 DIAGNOSIS — E785 Hyperlipidemia, unspecified: Secondary | ICD-10-CM | POA: Insufficient documentation

## 2022-09-29 DIAGNOSIS — K118 Other diseases of salivary glands: Secondary | ICD-10-CM

## 2022-09-29 DIAGNOSIS — Z79899 Other long term (current) drug therapy: Secondary | ICD-10-CM | POA: Insufficient documentation

## 2022-09-29 DIAGNOSIS — I10 Essential (primary) hypertension: Secondary | ICD-10-CM | POA: Insufficient documentation

## 2022-09-29 HISTORY — PX: PAROTIDECTOMY: SHX2163

## 2022-09-29 HISTORY — DX: Pneumonia, unspecified organism: J18.9

## 2022-09-29 HISTORY — DX: Anxiety disorder, unspecified: F41.9

## 2022-09-29 SURGERY — EXCISION, PAROTID GLAND
Anesthesia: General | Laterality: Left

## 2022-09-29 MED ORDER — CHLORHEXIDINE GLUCONATE 0.12 % MT SOLN
15.0000 mL | Freq: Once | OROMUCOSAL | Status: AC
  Administered 2022-09-29: 15 mL via OROMUCOSAL
  Filled 2022-09-29: qty 15

## 2022-09-29 MED ORDER — KETAMINE HCL 50 MG/5ML IJ SOSY
PREFILLED_SYRINGE | INTRAMUSCULAR | Status: AC
  Filled 2022-09-29: qty 5

## 2022-09-29 MED ORDER — PROPOFOL 10 MG/ML IV BOLUS
INTRAVENOUS | Status: DC | PRN
  Administered 2022-09-29: 120 mg via INTRAVENOUS

## 2022-09-29 MED ORDER — HYDROMORPHONE HCL 1 MG/ML IJ SOLN
INTRAMUSCULAR | Status: AC
  Filled 2022-09-29: qty 0.5

## 2022-09-29 MED ORDER — ORAL CARE MOUTH RINSE: 15.0000 mL | Freq: Once | OROMUCOSAL | Status: AC

## 2022-09-29 MED ORDER — 0.9 % SODIUM CHLORIDE (POUR BTL) OPTIME
TOPICAL | Status: DC | PRN
  Administered 2022-09-29: 1000 mL

## 2022-09-29 MED ORDER — ONDANSETRON 4 MG PO TBDP: 4.0000 mg | ORAL_TABLET | Freq: Three times a day (TID) | ORAL | 0 refills | Status: DC | PRN

## 2022-09-29 MED ORDER — HYDROMORPHONE HCL 1 MG/ML IJ SOLN
0.2500 mg | INTRAMUSCULAR | Status: DC | PRN
  Administered 2022-09-29 (×4): 0.25 mg via INTRAVENOUS
  Administered 2022-09-29 (×2): 0.5 mg via INTRAVENOUS

## 2022-09-29 MED ORDER — FENTANYL CITRATE (PF) 250 MCG/5ML IJ SOLN
INTRAMUSCULAR | Status: AC
  Filled 2022-09-29: qty 5

## 2022-09-29 MED ORDER — SUCCINYLCHOLINE CHLORIDE 200 MG/10ML IV SOSY
PREFILLED_SYRINGE | INTRAVENOUS | Status: DC | PRN
  Administered 2022-09-29: 160 mg via INTRAVENOUS

## 2022-09-29 MED ORDER — PHENYLEPHRINE HCL-NACL 20-0.9 MG/250ML-% IV SOLN
INTRAVENOUS | Status: DC | PRN
  Administered 2022-09-29: 50 ug/min via INTRAVENOUS

## 2022-09-29 MED ORDER — HYDROCODONE-ACETAMINOPHEN 7.5-325 MG PO TABS: 1.0000 | ORAL_TABLET | Freq: Four times a day (QID) | ORAL | 0 refills | Status: DC | PRN

## 2022-09-29 MED ORDER — HYDROMORPHONE HCL 1 MG/ML IJ SOLN
INTRAMUSCULAR | Status: AC
  Filled 2022-09-29: qty 1

## 2022-09-29 MED ORDER — LACTATED RINGERS IV SOLN: INTRAVENOUS | Status: DC

## 2022-09-29 MED ORDER — DEXAMETHASONE SODIUM PHOSPHATE 10 MG/ML IJ SOLN
INTRAMUSCULAR | Status: DC | PRN
  Administered 2022-09-29: 5 mg via INTRAVENOUS

## 2022-09-29 MED ORDER — KETAMINE HCL 10 MG/ML IJ SOLN
INTRAMUSCULAR | Status: DC | PRN
  Administered 2022-09-29: 25 mg via INTRAVENOUS
  Administered 2022-09-29: 10 mg via INTRAVENOUS
  Administered 2022-09-29: 15 mg via INTRAVENOUS

## 2022-09-29 MED ORDER — LIDOCAINE 2% (20 MG/ML) 5 ML SYRINGE
INTRAMUSCULAR | Status: DC | PRN
  Administered 2022-09-29: 80 mg via INTRAVENOUS

## 2022-09-29 MED ORDER — FENTANYL CITRATE (PF) 250 MCG/5ML IJ SOLN
INTRAMUSCULAR | Status: DC | PRN
  Administered 2022-09-29: 100 ug via INTRAVENOUS
  Administered 2022-09-29: 75 ug via INTRAVENOUS
  Administered 2022-09-29: 50 ug via INTRAVENOUS
  Administered 2022-09-29: 25 ug via INTRAVENOUS

## 2022-09-29 MED ORDER — ONDANSETRON HCL 4 MG/2ML IJ SOLN
INTRAMUSCULAR | Status: DC | PRN
  Administered 2022-09-29: 4 mg via INTRAVENOUS

## 2022-09-29 MED ORDER — MIDAZOLAM HCL 2 MG/2ML IJ SOLN
INTRAMUSCULAR | Status: DC | PRN
  Administered 2022-09-29: 2 mg via INTRAVENOUS

## 2022-09-29 MED ORDER — HYDROMORPHONE HCL 1 MG/ML IJ SOLN
INTRAMUSCULAR | Status: DC | PRN
  Administered 2022-09-29: .5 mg via INTRAVENOUS
  Administered 2022-09-29 (×2): .25 mg via INTRAVENOUS

## 2022-09-29 MED ORDER — ACETAMINOPHEN 500 MG PO TABS
1000.0000 mg | ORAL_TABLET | Freq: Once | ORAL | Status: AC
  Administered 2022-09-29: 1000 mg via ORAL
  Filled 2022-09-29: qty 2

## 2022-09-29 MED ORDER — MIDAZOLAM HCL 2 MG/2ML IJ SOLN
INTRAMUSCULAR | Status: AC
  Filled 2022-09-29: qty 2

## 2022-09-29 MED ORDER — CHLORHEXIDINE GLUCONATE 0.12 % MT SOLN: 15.0000 mL | Freq: Once | OROMUCOSAL | Status: AC

## 2022-09-29 SURGICAL SUPPLY — 56 items
ATTRACTOMAT 16X20 MAGNETIC DRP (DRAPES) IMPLANT
BAG COUNTER SPONGE SURGICOUNT (BAG) ×1 IMPLANT
BLADE CLIPPER SURG (BLADE) IMPLANT
BLADE SURG 15 STRL LF DISP TIS (BLADE) IMPLANT
BLADE SURG 15 STRL SS (BLADE)
CANISTER SUCT 3000ML PPV (MISCELLANEOUS) ×1 IMPLANT
CLEANER TIP ELECTROSURG 2X2 (MISCELLANEOUS) ×1 IMPLANT
CNTNR URN SCR LID CUP LEK RST (MISCELLANEOUS) IMPLANT
CONT SPEC 4OZ STRL OR WHT (MISCELLANEOUS)
CORD BIPOLAR FORCEPS 12FT (ELECTRODE) ×1 IMPLANT
COVER SURGICAL LIGHT HANDLE (MISCELLANEOUS) ×1 IMPLANT
DERMABOND ADVANCED .7 DNX12 (GAUZE/BANDAGES/DRESSINGS) ×1 IMPLANT
DRAIN JACKSON RD 7FR 3/32 (WOUND CARE) IMPLANT
DRAIN JP 10F RND RADIO (DRAIN) IMPLANT
DRAPE HALF SHEET 40X57 (DRAPES) IMPLANT
DRAPE INCISE 23X17 IOBAN STRL (DRAPES) ×1
DRAPE INCISE 23X17 STRL (DRAPES) ×1 IMPLANT
DRAPE INCISE IOBAN 23X17 STRL (DRAPES) ×1 IMPLANT
ELECT COATED BLADE 2.86 ST (ELECTRODE) ×1 IMPLANT
ELECT PAIRED SUBDERMAL (MISCELLANEOUS) ×1
ELECT REM PT RETURN 9FT ADLT (ELECTROSURGICAL) ×1
ELECTRODE PAIRED SUBDERMAL (MISCELLANEOUS) IMPLANT
ELECTRODE REM PT RTRN 9FT ADLT (ELECTROSURGICAL) ×1 IMPLANT
EVACUATOR SILICONE 100CC (DRAIN) IMPLANT
FORCEPS BIPOLAR SPETZLER 8 1.0 (NEUROSURGERY SUPPLIES) ×1 IMPLANT
GAUZE 4X4 16PLY ~~LOC~~+RFID DBL (SPONGE) ×1 IMPLANT
GLOVE BIOGEL PI IND STRL 6.5 (GLOVE) IMPLANT
GLOVE BIOGEL PI IND STRL 7.0 (GLOVE) IMPLANT
GLOVE ECLIPSE 6.5 STRL STRAW (GLOVE) IMPLANT
GLOVE ECLIPSE 7.5 STRL STRAW (GLOVE) ×1 IMPLANT
GOWN STRL REUS W/ TWL LRG LVL3 (GOWN DISPOSABLE) ×2 IMPLANT
GOWN STRL REUS W/TWL LRG LVL3 (GOWN DISPOSABLE) ×3
KIT BASIN OR (CUSTOM PROCEDURE TRAY) ×1 IMPLANT
KIT TURNOVER KIT B (KITS) ×1 IMPLANT
LOCATOR NERVE 3 VOLT (DISPOSABLE) IMPLANT
NDL PRECISIONGLIDE 27X1.5 (NEEDLE) IMPLANT
NEEDLE PRECISIONGLIDE 27X1.5 (NEEDLE) IMPLANT
NS IRRIG 1000ML POUR BTL (IV SOLUTION) ×1 IMPLANT
PAD ARMBOARD 7.5X6 YLW CONV (MISCELLANEOUS) ×2 IMPLANT
PENCIL FOOT CONTROL (ELECTRODE) ×1 IMPLANT
PROBE NERVBE PRASS .33 (MISCELLANEOUS) IMPLANT
SHEARS HARMONIC 9CM CVD (BLADE) ×1 IMPLANT
STAPLER VISISTAT 35W (STAPLE) ×1 IMPLANT
SUT CHROMIC 3 0 SH 27 (SUTURE) IMPLANT
SUT CHROMIC 4 0 PS 2 18 (SUTURE) ×1 IMPLANT
SUT ETHILON 4 0 PS 2 18 (SUTURE) IMPLANT
SUT ETHILON 5 0 P 3 18 (SUTURE)
SUT NYLON ETHILON 5-0 P-3 1X18 (SUTURE) IMPLANT
SUT SILK 2 0 SH CR/8 (SUTURE) ×1 IMPLANT
SUT SILK 3 0 REEL (SUTURE) IMPLANT
SUT SILK 4 0 REEL (SUTURE) ×1 IMPLANT
SUT VIC AB 3-0 SH 18 (SUTURE) IMPLANT
SYR CONTROL 10ML LL (SYRINGE) IMPLANT
TOWEL GREEN STERILE FF (TOWEL DISPOSABLE) ×1 IMPLANT
TRAY ENT MC OR (CUSTOM PROCEDURE TRAY) ×1 IMPLANT
TRAY FOLEY MTR SLVR 14FR STAT (SET/KITS/TRAYS/PACK) IMPLANT

## 2022-09-29 NOTE — Anesthesia Postprocedure Evaluation (Signed)
Anesthesia Post Note  Patient: Louis [name]  Procedure(s) Performed: LEFT SUPERFICIAL PAROTIDECTOMY WITH FACIAL NERVE DISSECTION AND FACIAL NERVE MONITORING (Left)     Patient location during evaluation: PACU Anesthesia Type: General Level of consciousness: awake Pain management: pain level controlled Vital Signs Assessment: post-procedure vital signs reviewed and stable Respiratory status: spontaneous breathing, nonlabored ventilation and respiratory function stable Cardiovascular status: blood pressure returned to baseline and stable Postop Assessment: no apparent nausea or vomiting Anesthetic complications: no   No notable events documented.  Last Vitals:  Vitals:   09/29/22 1615 09/29/22 1630  BP: 122/64 121/70  Pulse: (!) 56 67  Resp: 16 11  Temp:  37.6 C  SpO2: 92% 96%    Last Pain:  Vitals:   09/29/22 1630  TempSrc:   PainSc: 4                  Maziah Smola P Domenick Quebedeaux

## 2022-09-29 NOTE — Transfer of Care (Signed)
Immediate Anesthesia Transfer of Care Note  Patient: Louis [name]  Procedure(s) Performed: LEFT SUPERFICIAL PAROTIDECTOMY WITH FACIAL NERVE DISSECTION AND FACIAL NERVE MONITORING (Left)  Patient Location: PACU  Anesthesia Type:General  Level of Consciousness: awake and patient cooperative  Airway & Oxygen Therapy: Patient Spontanous Breathing and Patient connected to face mask oxygen  Post-op Assessment: Report given to RN, Post -op Vital signs reviewed and stable, and Patient moving all extremities  Post vital signs: Reviewed and stable  Last Vitals:  Vitals Value Taken Time  BP 139/68 09/29/22 1449  Temp    Pulse 82 09/29/22 1451  Resp 18 09/29/22 1451  SpO2 97 % 09/29/22 1451  Vitals shown include unvalidated device data.  Last Pain:  Vitals:   09/29/22 1116  TempSrc:   PainSc: 0-No pain         Complications: No notable events documented.

## 2022-09-29 NOTE — Anesthesia Procedure Notes (Signed)
Procedure Name: Intubation Date/Time: 09/29/2022 12:54 PM  Performed by: Elvin So, CRNAPre-anesthesia Checklist: Patient identified, Emergency Drugs available, Suction available and Patient being monitored Patient Re-evaluated:Patient Re-evaluated prior to induction Oxygen Delivery Method: Circle System Utilized Preoxygenation: Pre-oxygenation with 100% oxygen Induction Type: IV induction Ventilation: Mask ventilation without difficulty Laryngoscope Size: Mac and 4 Grade View: Grade I Tube type: Oral Tube size: 7.5 mm Number of attempts: 1 Airway Equipment and Method: Stylet and Oral airway Placement Confirmation: ETT inserted through vocal cords under direct vision, positive ETCO2 and breath sounds checked- equal and bilateral Secured at: 21 cm Tube secured with: Tape Dental Injury: Teeth and Oropharynx as per pre-operative assessment

## 2022-09-29 NOTE — Interval H&P Note (Signed)
History and Physical Interval Note:  09/29/2022 12:10 PM  Louis [name]  has presented today for surgery, with the diagnosis of Parotid mass.  The various methods of treatment have been discussed with the patient and family. After consideration of risks, benefits and other options for treatment, the patient has consented to  Procedure(s): [place] (Left) as a surgical intervention.  The patient's history has been reviewed, patient examined, no change in status, stable for surgery.  I have reviewed the patient's chart and labs.  Questions were answered to the patient's satisfaction.     Izora Gala

## 2022-09-29 NOTE — Discharge Instructions (Signed)
Be careful not to accidentally pull on the drain.  Make sure the bulb is always collapsed and that it is creating suction.  Empty the drain, measure the amount, and recharge it every 8 hours.

## 2022-09-29 NOTE — Op Note (Signed)
OPERATIVE REPORT  DATE OF SURGERY: 09/29/2022  PATIENT:  Louis [name],  67 y.o. male  PRE-OPERATIVE DIAGNOSIS:  Left Parotid Mass  POST-OPERATIVE DIAGNOSIS:  Left Parotid Mass  PROCEDURE:  Procedure(s): LEFT SUPERFICIAL PAROTIDECTOMY WITH FACIAL NERVE DISSECTION AND FACIAL NERVE MONITORING  SURGEON:  Beckie Salts, MD  ASSISTANTS: RNFA  ANESTHESIA:   General   EBL: 50 ml  DRAINS: 10 French round JP  LOCAL MEDICATIONS USED:  None  SPECIMEN: Left parotid  COUNTS:  Correct  PROCEDURE DETAILS: The patient was taken to the operating room and placed on the operating table in the supine position. Following induction of general endotracheal anesthesia, the left side of the face was prepped and draped in a standard fashion. A preauricular incision was outlined with a marking pen with extension down around the mastoid process and 2 fingerbreadths below the angle of the mandible.  The Nims facial nerve monitor was set up with all electrodes and connected to the device.  Electrocautery was used to incise the skin and subcutaneous tissue. The lateral branch of the greater auricular nerve was preserved. The parotid gland was dissected forward off of the upper sternocleidomastoid muscle and the digastric muscle was exposed posteriorly. The gland was also dissected off of the external auditory canal. Careful dissection superior to the digastric muscle and medial to the tympanomastoid suture line revealed the main trunk of the facial nerve. Using a McCabe dissector the main trunk was dissected out towards the pes anserinus. The upper and lower divisions were then dissected identifying all branches.  The harmonic scalpel was used to divide the parotid tissue. Bipolar cautery was used as needed for completion of hemostasis. The tumor was identified in the inferior most part of the gland. The glandular tissue with the accompanying tumor was dissected free and removed, sent for pathologic evaluation. The  wound was irrigated with saline and hemostasis was completed as needed.  The drain was exited through a separate stab incision and secured in place with nylon suture. A running subcuticular chromic closure was accomplished and Dermabond was used on the skin. The drain was charged. She was awakened, extubated and transferred to recovery in stable condition.  The facial nerve monitor was useful in this case due to the size of the tumor and the extent of the dissection to expose the entire tumor and all branches of the nerve.    PATIENT DISPOSITION:  To PACU, stable

## 2022-09-30 ENCOUNTER — Encounter (HOSPITAL_COMMUNITY): Payer: Self-pay | Admitting: Otolaryngology

## 2022-10-01 LAB — SURGICAL PATHOLOGY

## 2022-10-07 ENCOUNTER — Ambulatory Visit: Payer: PRIVATE HEALTH INSURANCE | Admitting: Nurse Practitioner

## 2022-10-07 ENCOUNTER — Encounter: Payer: Self-pay | Admitting: Nurse Practitioner

## 2022-10-07 VITALS — BP 133/66 | HR 72 | Temp 98.0°F | Ht 71.0 in | Wt 193.0 lb

## 2022-10-07 DIAGNOSIS — G250 Essential tremor: Secondary | ICD-10-CM | POA: Diagnosis not present

## 2022-10-07 DIAGNOSIS — Z820 Family history of epilepsy and other diseases of the nervous system: Secondary | ICD-10-CM | POA: Diagnosis not present

## 2022-10-07 MED ORDER — PROPRANOLOL HCL ER 120 MG PO CP24: 120.0000 mg | ORAL_CAPSULE | Freq: Every day | ORAL | 1 refills | Status: DC

## 2022-10-07 NOTE — Progress Notes (Signed)
   Subjective:    Patient ID: Taryn Nave, male    DOB: [birth year], 67 y.o.   MRN: [id number]   Chief Complaint: Tremors   HPI Patient comes in today c/o tremors. He has had these for several years but they seem  to be worsening. His tremors are mainly in his hands and only when trying to do something. Has mild shaking when he is sitting doing nothing, but e doesn't notice that much. He is really having  trouble writing or holding a cup of coffee. He says he grandfather also had tremors that were similar. Denies any gait issues.    Review of Systems  Constitutional:  Negative for diaphoresis.  Eyes:  Negative for pain.  Respiratory:  Negative for shortness of breath.   Cardiovascular:  Negative for chest pain, palpitations and leg swelling.  Gastrointestinal:  Negative for abdominal pain.  Endocrine: Negative for polydipsia.  Skin:  Negative for rash.  Neurological:  Negative for dizziness, weakness and headaches.  Hematological:  Does not bruise/bleed easily.  All other systems reviewed and are negative.      Objective:   Physical Exam Vitals reviewed.  Constitutional:      Appearance: Normal appearance.  Cardiovascular:     Rate and Rhythm: Normal rate and regular rhythm.     Heart sounds: Normal heart sounds.  Pulmonary:     Effort: Pulmonary effort is normal.     Breath sounds: Normal breath sounds.  Skin:    General: Skin is warm.     Comments: Mild tremor bil hands at rest Moderate tremor with purposeful movement of bil hands  Neurological:     General: No focal deficit present.     Mental Status: He is alert and oriented to person, place, and time.     Motor: Weakness present.  Psychiatric:        Mood and Affect: Mood normal.        Behavior: Behavior normal.    BP 133/66   Pulse 72   Temp 98 F (36.7 C)   Ht '5\' 11"'$  (1.803 m)   Wt 193 lb (87.5 kg)   SpO2 94%   BMI 26.92 kg/m         Assessment & Plan:  Tomi Bamberger in today with chief  complaint of Tremors   1. Benign essential tremor Start propranolol  Referral  to neurologist - Ambulatory referral to Neurology - propranolol ER (INDERAL LA) 120 MG 24 hr capsule; Take 1 capsule (120 mg total) by mouth daily.  Dispense: 90 capsule; Refill: 1  2. Family history of benign essential tremor     The above assessment and management plan was discussed with the patient. The patient verbalized understanding of and has agreed to the management plan. Patient is aware to call the clinic if symptoms persist or worsen. Patient is aware when to return to the clinic for a follow-up visit. Patient educated on when it is appropriate to go to the emergency department.   Mary-Margaret Hassell Done, FNP

## 2022-10-07 NOTE — Patient Instructions (Signed)
Essential Tremor ?A tremor is trembling or shaking that a person cannot control. Most tremors affect the hands or arms. Tremors can also affect the head, vocal cords, legs, and other parts of the body. Essential tremor is a tremor without a known cause. Usually, it occurs while a person is trying to perform an action. It tends to get worse gradually as a person ages. ?What are the causes? ?The cause of this condition is not known, but it often runs in families. ?What increases the risk? ?You are more likely to develop this condition if: ?You have a family member with essential tremor. ?You are 40 years of age or older. ?What are the signs or symptoms? ?The main sign of a tremor is a rhythmic shaking of certain parts of your body that is uncontrolled and unintentional. You may: ?Have difficulty eating with a spoon or fork. ?Have difficulty writing. ?Nod your head up and down or side to side. ?Have a quivering voice. ?The shaking may: ?Get worse over time. ?Come and go. ?Be more noticeable on one side of your body. ?Get worse due to stress, tiredness (fatigue), caffeine, and extreme heat or cold. ?How is this diagnosed? ?This condition may be diagnosed based on: ?Your symptoms and medical history. ?A physical exam. ?There is no single test to diagnose an essential tremor. However, your health care provider may order tests to rule out other causes of your condition. These may include: ?Blood and urine tests. ?Imaging studies of your brain, such as a CT scan or MRI. ?How is this treated? ?Treatment for essential tremor depends on the severity of the condition. ?Mild tremors may not need treatment if they do not affect your day-to-day life. ?Severe tremors may need to be treated using one or more of the following options: ?Medicines. ?Injections of a substance called botulinum toxin. ?Procedures such as deep brain stimulation (DBS) implantation or MRI-guided ultrasound treatment. ?Lifestyle changes. ?Occupational or  physical therapy. ?Follow these instructions at home: ?Lifestyle ? ?Do not use any products that contain nicotine or tobacco. These products include cigarettes, chewing tobacco, and vaping devices, such as e-cigarettes. If you need help quitting, ask your health care provider. ?Limit your caffeine intake as told by your health care provider. ?Try to get 8 hours of sleep each night. ?Find ways to manage your stress that fit your lifestyle and personality. Consider trying meditation or yoga. ?Try to anticipate stressful situations and allow extra time to manage them. ?If you are struggling emotionally with the effects of your tremor, consider working with a mental health provider. ?General instructions ?Take over-the-counter and prescription medicines only as told by your health care provider. ?Avoid extreme heat and extreme cold. ?Keep all follow-up visits. This is important. Visits may include physical therapy visits. ?Where to find more information ?National Institute of Neurological Disorders and Stroke: www.ninds.nih.gov ?Contact a health care provider if: ?You experience any changes in the location or intensity of your tremors. ?You start having a tremor after starting a new medicine. ?You have a tremor with other symptoms, such as: ?Numbness. ?Tingling. ?Pain. ?Weakness. ?Your tremor gets worse. ?Your tremor interferes with your daily life. ?You feel down, blue, or sad for at least 2 weeks in a row. ?Worrying about your tremor and what other people think about you interferes with your everyday life functions, including relationships, work, or school. ?Summary ?Essential tremor is a tremor without a known cause. Usually, it occurs when you are trying to perform an action. ?You are more likely   to develop this condition if you have a family member with essential tremor. ?The main sign of a tremor is a rhythmic shaking of certain parts of your body that is uncontrolled and unintentional. ?Treatment for essential  tremor depends on the severity of the condition. ?This information is not intended to replace advice given to you by your health care provider. Make sure you discuss any questions you have with your health care provider. ?Document Revised: 06/21/2021 Document Reviewed: 06/21/2021 ?Elsevier Patient Education ? 2023 [employer] ? ?

## 2022-12-01 ENCOUNTER — Emergency Department (HOSPITAL_BASED_OUTPATIENT_CLINIC_OR_DEPARTMENT_OTHER): Payer: PRIVATE HEALTH INSURANCE | Admitting: Radiology

## 2022-12-01 ENCOUNTER — Ambulatory Visit: Payer: PRIVATE HEALTH INSURANCE | Admitting: Family Medicine

## 2022-12-01 ENCOUNTER — Other Ambulatory Visit: Payer: Self-pay

## 2022-12-01 ENCOUNTER — Inpatient Hospital Stay (HOSPITAL_BASED_OUTPATIENT_CLINIC_OR_DEPARTMENT_OTHER)
Admission: EM | Admit: 2022-12-01 | Discharge: 2022-12-02 | DRG: 310 | Disposition: A | Discharge status: Home / Self Care | Payer: PRIVATE HEALTH INSURANCE | Attending: Internal Medicine | Admitting: Internal Medicine

## 2022-12-01 ENCOUNTER — Encounter: Payer: Self-pay | Admitting: Family Medicine

## 2022-12-01 ENCOUNTER — Encounter (HOSPITAL_BASED_OUTPATIENT_CLINIC_OR_DEPARTMENT_OTHER): Payer: Self-pay | Admitting: Emergency Medicine

## 2022-12-01 ENCOUNTER — Other Ambulatory Visit (HOSPITAL_BASED_OUTPATIENT_CLINIC_OR_DEPARTMENT_OTHER): Payer: Self-pay

## 2022-12-01 VITALS — BP 163/104 | HR 119 | Temp 97.4°F | Ht 71.0 in | Wt 202.0 lb

## 2022-12-01 DIAGNOSIS — Z96643 Presence of artificial hip joint, bilateral: Secondary | ICD-10-CM | POA: Diagnosis present

## 2022-12-01 DIAGNOSIS — Z8701 Personal history of pneumonia (recurrent): Secondary | ICD-10-CM | POA: Diagnosis not present

## 2022-12-01 DIAGNOSIS — Z87891 Personal history of nicotine dependence: Secondary | ICD-10-CM | POA: Diagnosis not present

## 2022-12-01 DIAGNOSIS — R0602 Shortness of breath: Secondary | ICD-10-CM | POA: Diagnosis not present

## 2022-12-01 DIAGNOSIS — F419 Anxiety disorder, unspecified: Secondary | ICD-10-CM | POA: Diagnosis present

## 2022-12-01 DIAGNOSIS — D72829 Elevated white blood cell count, unspecified: Secondary | ICD-10-CM | POA: Diagnosis present

## 2022-12-01 DIAGNOSIS — I10 Essential (primary) hypertension: Secondary | ICD-10-CM

## 2022-12-01 DIAGNOSIS — F1010 Alcohol abuse, uncomplicated: Secondary | ICD-10-CM | POA: Diagnosis present

## 2022-12-01 DIAGNOSIS — I4819 Other persistent atrial fibrillation: Secondary | ICD-10-CM | POA: Diagnosis present

## 2022-12-01 DIAGNOSIS — E785 Hyperlipidemia, unspecified: Secondary | ICD-10-CM | POA: Diagnosis present

## 2022-12-01 DIAGNOSIS — Z79899 Other long term (current) drug therapy: Secondary | ICD-10-CM | POA: Diagnosis not present

## 2022-12-01 DIAGNOSIS — F064 Anxiety disorder due to known physiological condition: Secondary | ICD-10-CM

## 2022-12-01 DIAGNOSIS — I4891 Unspecified atrial fibrillation: Secondary | ICD-10-CM | POA: Diagnosis not present

## 2022-12-01 DIAGNOSIS — E782 Mixed hyperlipidemia: Secondary | ICD-10-CM | POA: Diagnosis not present

## 2022-12-01 DIAGNOSIS — G250 Essential tremor: Secondary | ICD-10-CM | POA: Diagnosis present

## 2022-12-01 DIAGNOSIS — Z880 Allergy status to penicillin: Secondary | ICD-10-CM | POA: Diagnosis not present

## 2022-12-01 DIAGNOSIS — Z7901 Long term (current) use of anticoagulants: Secondary | ICD-10-CM | POA: Diagnosis not present

## 2022-12-01 DIAGNOSIS — Z885 Allergy status to narcotic agent status: Secondary | ICD-10-CM | POA: Diagnosis not present

## 2022-12-01 DIAGNOSIS — R000 Tachycardia, unspecified: Secondary | ICD-10-CM | POA: Diagnosis present

## 2022-12-01 DIAGNOSIS — I480 Paroxysmal atrial fibrillation: Principal | ICD-10-CM

## 2022-12-01 LAB — COMPREHENSIVE METABOLIC PANEL
ALT: 35 U/L (ref 0–44)
AST: 29 U/L (ref 15–41)
Albumin: 4.2 g/dL (ref 3.5–5.0)
Alkaline Phosphatase: 77 U/L (ref 38–126)
Anion gap: 12 (ref 5–15)
BUN: 22 mg/dL (ref 8–23)
CO2: 27 mmol/L (ref 22–32)
Calcium: 10.5 mg/dL — ABNORMAL HIGH (ref 8.9–10.3)
Chloride: 100 mmol/L (ref 98–111)
Creatinine, Ser: 0.99 mg/dL (ref 0.61–1.24)
GFR, Estimated: >60 mL/min (ref >60)
Glucose, Bld: 96 mg/dL (ref 70–99)
Potassium: 4.4 mmol/L (ref 3.5–5.1)
Sodium: 139 mmol/L (ref 135–145)
Total Bilirubin: 1.1 mg/dL (ref 0.3–1.2)
Total Protein: 7.3 g/dL (ref 6.5–8.1)

## 2022-12-01 LAB — CBC
HCT: 37.3 % — ABNORMAL LOW (ref 39.0–52.0)
Hemoglobin: 13.3 g/dL (ref 13.0–17.0)
MCH: 31.9 pg (ref 26.0–34.0)
MCHC: 35.7 g/dL (ref 30.0–36.0)
MCV: 89.4 fL (ref 80.0–100.0)
Platelets: 233 10*3/uL (ref 150–400)
RBC: 4.17 MIL/uL — ABNORMAL LOW (ref 4.22–5.81)
RDW: 12.2 % (ref 11.5–15.5)
WBC: 13.3 10*3/uL — ABNORMAL HIGH (ref 4.0–10.5)
nRBC: 0 % (ref 0.0–0.2)

## 2022-12-01 LAB — TROPONIN I (HIGH SENSITIVITY)
Troponin I (High Sensitivity): 11 ng/L (ref <18)
Troponin I (High Sensitivity): 13 ng/L (ref <18)

## 2022-12-01 LAB — ETHANOL: Alcohol, Ethyl (B): <10 mg/dL (ref <10)

## 2022-12-01 LAB — BRAIN NATRIURETIC PEPTIDE: B Natriuretic Peptide: 314 pg/mL — ABNORMAL HIGH (ref 0.0–100.0)

## 2022-12-01 MED ORDER — LORAZEPAM 0.5 MG PO TABS
0.5000 mg | ORAL_TABLET | ORAL | Status: AC | PRN
  Administered 2022-12-01 – 2022-12-02 (×3): 0.5 mg via ORAL
  Filled 2022-12-01 (×3): qty 1

## 2022-12-01 MED ORDER — SODIUM CHLORIDE 0.9 % IV BOLUS: 1000.0000 mL | Freq: Once | INTRAVENOUS | Status: DC

## 2022-12-01 MED ORDER — METOPROLOL TARTRATE 5 MG/5ML IV SOLN
5.0000 mg | Freq: Once | INTRAVENOUS | Status: AC
  Administered 2022-12-01: 5 mg via INTRAVENOUS
  Filled 2022-12-01: qty 5

## 2022-12-01 MED ORDER — LORAZEPAM 2 MG/ML IJ SOLN
1.0000 mg | Freq: Once | INTRAMUSCULAR | Status: AC
  Administered 2022-12-01: 1 mg via INTRAVENOUS
  Filled 2022-12-01: qty 1

## 2022-12-01 MED ORDER — LISINOPRIL 20 MG PO TABS
40.0000 mg | ORAL_TABLET | Freq: Every day | ORAL | Status: DC
  Administered 2022-12-02: 40 mg via ORAL
  Filled 2022-12-01: qty 2

## 2022-12-01 MED ORDER — DILTIAZEM LOAD VIA INFUSION
10.0000 mg | Freq: Once | INTRAVENOUS | Status: AC
  Administered 2022-12-01: 10 mg via INTRAVENOUS
  Filled 2022-12-01: qty 10

## 2022-12-01 MED ORDER — HEPARIN BOLUS VIA INFUSION
5500.0000 [IU] | Freq: Once | INTRAVENOUS | Status: AC
  Administered 2022-12-01: 5500 [IU] via INTRAVENOUS

## 2022-12-01 MED ORDER — APIXABAN 5 MG PO TABS
5.0000 mg | ORAL_TABLET | Freq: Two times a day (BID) | ORAL | Status: DC
  Administered 2022-12-01 – 2022-12-02 (×2): 5 mg via ORAL
  Filled 2022-12-01 (×2): qty 1

## 2022-12-01 MED ORDER — DILTIAZEM HCL ER COATED BEADS 180 MG PO CP24: 180.0000 mg | ORAL_CAPSULE | Freq: Every day | ORAL | Status: DC

## 2022-12-01 MED ORDER — ONDANSETRON HCL 4 MG/2ML IJ SOLN: 4.0000 mg | Freq: Four times a day (QID) | INTRAMUSCULAR | Status: DC | PRN

## 2022-12-01 MED ORDER — HEPARIN (PORCINE) 25000 UT/250ML-% IV SOLN
1300.0000 [IU]/h | INTRAVENOUS | Status: DC
  Administered 2022-12-01: 1300 [IU]/h via INTRAVENOUS
  Filled 2022-12-01: qty 250

## 2022-12-01 MED ORDER — PRAVASTATIN SODIUM 10 MG PO TABS
20.0000 mg | ORAL_TABLET | Freq: Every day | ORAL | Status: DC
  Administered 2022-12-02: 20 mg via ORAL
  Filled 2022-12-01: qty 2

## 2022-12-01 MED ORDER — DILTIAZEM HCL-DEXTROSE 125-5 MG/125ML-% IV SOLN (PREMIX)
5.0000 mg/h | INTRAVENOUS | Status: DC
  Administered 2022-12-01: 5 mg/h via INTRAVENOUS
  Administered 2022-12-01: 10 mg/h via INTRAVENOUS
  Filled 2022-12-01 (×2): qty 125

## 2022-12-01 MED ORDER — ACETAMINOPHEN 325 MG PO TABS: 650.0000 mg | ORAL_TABLET | ORAL | Status: DC | PRN

## 2022-12-01 NOTE — ED Notes (Signed)
[facility] at [facility] will transport for Bed Ready  am [facility] [address] Room# 19.-ABB(NS)

## 2022-12-01 NOTE — ED Provider Notes (Signed)
[facility] Provider Note   CSN: [id number] Arrival date & time: 12/01/22  [id number]     History  Chief Complaint  Patient presents with   Atrial Fibrillation    Louis [name] is a 67 y.o. male past medical history significant for hyperlipidemia, hypertension, previous A-fib with RVR, tobacco abuse, alcohol abuse who presents with concern for generalized weakness, lethargy for 3 weeks to a month.  Last May he was in A-fib secondary to pneumonia and never followed up with a cardiologist.  Patient reports that he drinks about half pint of liquor at least every other day, and has been doing so for years.  Patient denies any unilateral numbness, weakness.  He denies any recent fever, chills, nausea, vomiting.  He does endorse some occasional shortness of breath.  Patient was placed on propranolol secondary to presumed familial benign essential tremor, he still takes diltiazem 120 mg 24-hour capsule daily, reports last dose was this morning.  He denies any chest pain at this time.   Atrial Fibrillation       Home Medications Prior to Admission medications   Medication Sig Start Date End Date Taking? Authorizing Provider  diltiazem (CARDIZEM CD) 120 MG 24 hr capsule Take 1 capsule (120 mg total) by mouth daily. 01/30/22 01/30/23  Manuella Ghazi, Pratik D, DO  HYDROcodone-acetaminophen (NORCO) 7.5-325 MG tablet Take 1 tablet by mouth every 6 (six) hours as needed for moderate pain. 09/29/22   Izora Gala, MD  ibuprofen (ADVIL) 200 MG tablet Take 200 mg by mouth every 8 (eight) hours as needed for fever, headache or mild pain.    [provider]  lisinopril (ZESTRIL) 40 MG tablet Take 1 tablet (40 mg total) by mouth daily. 09/04/22   Dettinger, Fransisca Kaufmann, MD  pravastatin (PRAVACHOL) 20 MG tablet Take 1 tablet (20 mg total) by mouth daily. 09/04/22   Dettinger, Fransisca Kaufmann, MD  propranolol ER (INDERAL LA) 120 MG 24 hr capsule Take 1 capsule (120 mg total) by  mouth daily. Patient not taking: Reported on 12/01/2022 10/07/22   Chevis Pretty, FNP      Allergies    Penicillins and Tramadol    Review of Systems   Review of Systems  All other systems reviewed and are negative.   Physical Exam Updated Vital Signs BP (!) 151/88   Pulse (!) 101   Temp 98.2 F (36.8 C) (Oral)   Resp 17   Ht 5\' 11"  (1.803 m)   Wt 91.6 kg   SpO2 93%   BMI 28.17 kg/m  Physical Exam Vitals and nursing note reviewed.  Constitutional:      General: He is not in acute distress.    Appearance: Normal appearance. He is ill-appearing and diaphoretic.  HENT:     Head: Normocephalic and atraumatic.  Eyes:     General:        Right eye: No discharge.        Left eye: No discharge.  Cardiovascular:     Rate and Rhythm: Regular rhythm. Tachycardia present.     Heart sounds: No murmur heard.    No friction rub. No gallop.  Pulmonary:     Effort: Pulmonary effort is normal.     Breath sounds: Normal breath sounds.  Abdominal:     General: Bowel sounds are normal.     Palpations: Abdomen is soft.  Skin:    General: Skin is warm.     Capillary Refill: Capillary refill takes less than  2 seconds.  Neurological:     Mental Status: He is alert and oriented to person, place, and time.  Psychiatric:        Mood and Affect: Mood normal.        Behavior: Behavior normal.     ED Results / Procedures / Treatments   Labs (all labs ordered are listed, but only abnormal results are displayed) Labs Reviewed  CBC - Abnormal; Notable for the following components:      Result Value   WBC 13.3 (*)    RBC 4.17 (*)    HCT 37.3 (*)    All other components within normal limits  COMPREHENSIVE METABOLIC PANEL - Abnormal; Notable for the following components:   Calcium 10.5 (*)    All other components within normal limits  BRAIN NATRIURETIC PEPTIDE - Abnormal; Notable for the following components:   B Natriuretic Peptide 314.0 (*)    All other components within  normal limits  ETHANOL  HEPARIN LEVEL (UNFRACTIONATED)  TROPONIN I (HIGH SENSITIVITY)  TROPONIN I (HIGH SENSITIVITY)    EKG EKG Interpretation  Date/Time:  Monday December 01 2022 09:45:17 EDT Ventricular Rate:  115 PR Interval:    QRS Duration: 112 QT Interval:  344 QTC Calculation: 451 R Axis:   92 Text Interpretation: Atrial fibrillation Incomplete right bundle branch block agree, afib not seen on previous tracing Confirmed by Charlesetta Shanks [phone]) on 12/01/2022 9:57:02 AM  Radiology DG Chest Port 1 View  Result Date: 12/01/2022 CLINICAL DATA:  [id number] A-fib [facility]) [id number] EXAM: PORTABLE CHEST - 1 VIEW COMPARISON:  02/20/2022 FINDINGS: Cardiac silhouette is unremarkable. No pneumothorax or pleural effusion. The lungs are clear. Aorta is calcified. The visualized skeletal structures are unremarkable. IMPRESSION: No acute cardiopulmonary process. Electronically Signed   By: Sammie Bench M.D.   On: 12/01/2022 10:23    Procedures Procedures    Medications Ordered in ED Medications  diltiazem (CARDIZEM) 1 mg/mL load via infusion 10 mg (10 mg Intravenous Bolus from Bag 12/01/22 1124)    And  diltiazem (CARDIZEM) 125 mg in dextrose 5% 125 mL (1 mg/mL) infusion (5 mg/hr Intravenous New Bag/Given 12/01/22 1123)  heparin ADULT infusion 100 units/mL (25000 units/222mL) (1,300 Units/hr Intravenous New Bag/Given 12/01/22 1142)  metoprolol tartrate (LOPRESSOR) injection 5 mg (5 mg Intravenous Given 12/01/22 1020)  LORazepam (ATIVAN) injection 1 mg (1 mg Intravenous Given 12/01/22 1019)  heparin bolus via infusion 5,500 Units (5,500 Units Intravenous Bolus from Bag 12/01/22 1144)    ED Course/ Medical Decision Making/ A&P                             Medical Decision Making Amount and/or Complexity of Data Reviewed Labs: ordered. Radiology: ordered.  Risk Prescription drug management. Decision regarding hospitalization.   This patient is a 67 y.o. male who presents to the ED for  concern of weakness, tachycardia, shortness of breath, this involves an extensive number of treatment options, and is a complaint that carries with it a high risk of complications and morbidity. The emergent differential diagnosis prior to evaluation includes, but is not limited to, return of A-fib, RVR, new heart failure, alcohol withdrawal, pneumonia, CVA, spinal cord injury, ACS, arrhythmia, syncope, orthostatic hypotension, sepsis, hypoglycemia, hypoxia, electrolyte disturbance, endocrine disorder, anemia, environmental exposure, polypharmacy. This is not an exhaustive differential.   Past Medical History / Co-morbidities / Social History: Alcohol abuse, hyperlipidemia, hypertension, previous A-fib with RVR, tobacco abuse  Additional history:  Chart reviewed. Pertinent results include: Reviewed lab work, imaging from previous emergency department evaluations, admission a year ago for pneumonia, A-fib RVR, patient is taking diltiazem, he never followed up with cardiology for further evaluation, as EF at last echo was 55-60%  Physical Exam: Physical exam performed. The pertinent findings include: Patient is somewhat ill-appearing, diaphoretic, tachycardic with a regular rhythm, no accessory breath sounds noted.  Lab Tests: I ordered, and personally interpreted labs.  The pertinent results include: Patient with mild leukocytosis, white blood cells 13.3, no clinically significant anemia or platelet dysfunction.  CMP is overall unremarkable.  BNP is minimally elevated at 314, initial troponin is normal at 11.   Imaging Studies: I ordered imaging studies including plain film chest x-ray. I independently visualized and interpreted imaging which showed no evidence of acute intrathoracic abnormality. I agree with the radiologist interpretation.   Cardiac Monitoring:  The patient was maintained on a cardiac monitor.  My attending physician Dr. Johnney Killian viewed and interpreted the cardiac monitored which  showed an underlying rhythm of: Afib RVR. I agree with this interpretation.   Medications: I ordered medication including initially treated with Lopressor, Ativan, but heart rate did not improve, placed patient on diltiazem bolus and drip for afib RVR.  Patient continuing to have some elevated heart rate but rate is somewhat improved to around 110-115.  Considering he is still tachycardic I do think that he would benefit from admission for new A-fib without anticoagulation, concern for developing heart failure with elevated BNP  Consultations Obtained: I requested consultation with the hospitalist, spoke with Dr. Lorin Mercy,  and discussed lab and imaging findings as well as pertinent plan - they recommend: Admission for A-fib, RVR, anticoagulation   Disposition: After consideration of the diagnostic results and the patients response to treatment, I feel that patient would benefit from admission at this time. .  I discussed this case with my attending physician Dr. Johnney Killian who cosigned this note including patient's presenting symptoms, physical exam, and planned diagnostics and interventions. Attending physician stated agreement with plan or made changes to plan which were implemented.    Final Clinical Impression(s) / ED Diagnoses Final diagnoses:  None    Rx / DC Orders ED Discharge Orders     None         Dorien Chihuahua 12/01/22 1204    Charlesetta Shanks, MD 12/03/22 1020

## 2022-12-01 NOTE — Progress Notes (Signed)
Plan of Care Note for accepted transfer   Patient: Louis [name] MRN: [id number]   [place]: 12/01/2022  Facility requesting transfer: [facility] Requesting Provider: [facility] Reason for transfer: [place] course: Patient with h/o HTN and HLD presenting with SOB and fatigue.  He was sent by his PCP due to new-onset afib with RVR.  New onset afib, likely needs echo.  He did have one prior episode of afib associated with PNA, not sent home on [facility].  Heavy ETOH use, given Ativan.  Started on Dilt drip.  Started on heparin.  Plan of care: The patient is accepted for admission to Progressive unit, at [facility].   Author: Karmen Bongo, MD 12/01/2022  Check [email or web address] for on-call coverage.  Nursing staff, Please call [facility] number on Amion as soon as patient's arrival, so appropriate admitting provider can evaluate the pt.

## 2022-12-01 NOTE — Progress Notes (Signed)
Acute Office Visit  Subjective:     Patient ID: Louis [name], male    DOB: [birth year], 67 y.o.   MRN: [id number]  Chief Complaint  Patient presents with   Shortness of Breath    Shortness of Breath This is a new problem. Episode onset: 2 weeks. Episode frequency: with activity. The problem has been gradually worsening. Associated symptoms include chest pain, rhinorrhea, a sore throat, vomiting (yesterday x 1) and wheezing. Pertinent negatives include no abdominal pain, coryza, fever, headaches, leg pain, leg swelling, sputum production or syncope. The symptoms are aggravated by any activity. He has tried nothing for the symptoms. His past medical history is significant for CAD, COPD and pneumonia. There is no history of asthma, bronchiolitis, DVT, a heart failure or PE.  He report fatigue and weakness that has been worsening as well.  He has been on propanolol for the last few months for a tremor, but stopped taking this a 4 days ago.    Review of Systems  Constitutional:  Negative for fever.  HENT:  Positive for rhinorrhea and sore throat.   Respiratory:  Positive for shortness of breath and wheezing. Negative for sputum production.   Cardiovascular:  Positive for chest pain. Negative for leg swelling and syncope.  Gastrointestinal:  Positive for vomiting (yesterday x 1). Negative for abdominal pain.  Neurological:  Negative for headaches.        Objective:    BP (!) 163/104   Pulse (!) 119   Temp (!) 97.4 F (36.3 C) (Temporal)   Ht 5\' 11"  (1.803 m)   Wt 202 lb (91.6 kg)   SpO2 94%   BMI 28.17 kg/m  BP Readings from Last 3 Encounters:  12/01/22 (!) 163/104  10/07/22 133/66  09/29/22 121/70     Physical Exam Vitals and nursing note reviewed.  Constitutional:      General: He is not in acute distress.    Appearance: He is well-developed. He is not ill-appearing, toxic-appearing or diaphoretic.  HENT:     Head: Normocephalic and atraumatic.     Mouth/Throat:      Mouth: Mucous membranes are moist.     Pharynx: Oropharynx is clear.  Eyes:     Extraocular Movements: Extraocular movements intact.     Pupils: Pupils are equal, round, and reactive to light.  Cardiovascular:     Rate and Rhythm: Tachycardia present. Rhythm irregular.     Heart sounds: No murmur heard. Pulmonary:     Effort: Pulmonary effort is normal. No tachypnea, bradypnea, accessory muscle usage or respiratory distress.     Breath sounds: Examination of the left-upper field reveals wheezing. Wheezing present. No decreased breath sounds, rhonchi or rales.  Chest:     Chest wall: No tenderness.  Musculoskeletal:     Cervical back: Neck supple.     Right lower leg: No edema.     Left lower leg: No edema.  Skin:    General: Skin is warm and dry.  Neurological:     General: No focal deficit present.     Mental Status: He is alert and oriented to person, place, and time.     Motor: Weakness (generalized) present.  Psychiatric:        Mood and Affect: Mood normal.        Behavior: Behavior normal.     No results found for any visits on 12/01/22.      Assessment & Plan:   Louis [name] was seen today for shortness of  breath.  Diagnoses and all orders for this visit:  Atrial fibrillation with RVR ([place]) -     EKG 12-Lead  Primary hypertension  Shortness of breath  EKG with A. Fib today. Heart rate ranging from 113-137 during today's visit. On cardizem. BP is also uncontrolled today. Reporting worsening shortness of breath and fatigue. Slight wheeze to L upper lobe, otherwise lungs are clear with unlabored respirations. Recommend ED evaluation for symptomatic A. Fib with RVR with nonspecific ST depression on EKG. Patient refused EMS today but will have his wife take him to the ED.    Louis Perking, FNP

## 2022-12-01 NOTE — ED Provider Notes (Incomplete)
I provided a substantive portion of the care of this patient.  I personally made/approved the management plan for this patient and take responsibility for the patient management. {Remember to document shared critical care using "edcritical" dot phrase:1} EKG Interpretation  Date/Time:  Monday December 01 2022 09:45:17 EDT Ventricular Rate:  115 PR Interval:    QRS Duration: 112 QT Interval:  344 QTC Calculation: 451 R Axis:   92 Text Interpretation: Atrial fibrillation Incomplete right bundle branch block agree, afib not seen on previous tracing Confirmed by Charlesetta Shanks [phone]) on 12/01/2022 9:57:02 AM

## 2022-12-01 NOTE — Progress Notes (Signed)
ANTICOAGULATION CONSULT NOTE - Initial Consult  Pharmacy Consult for heparin>>apixaban Indication: atrial fibrillation  Allergies  Allergen Reactions   Penicillins Other (See Comments)    Occurred as child not sure what type of reaction he had   Tramadol Other (See Comments)    Patient states it made him feel like he had the flu    Patient Measurements: Height: 5\' 11"  (180.3 cm) Weight: 91.6 kg (202 lb) IBW/kg (Calculated) : 75.3 Heparin Dosing Weight: 91.6kg  Vital Signs: Temp: 98.1 F (36.7 C) (03/18 1632) Temp Source: Oral (03/18 1632) BP: 164/90 (03/18 1632) Pulse Rate: 85 (03/18 1700)  Labs: Recent Labs    12/01/22 1010 12/01/22 1420  HGB 13.3  --   HCT 37.3*  --   PLT 233  --   CREATININE 0.99  --   TROPONINIHS 11 13     Estimated Creatinine Clearance: 86.1 mL/min (by C-G formula based on SCr of 0.99 mg/dL).   Medical History: Past Medical History:  Diagnosis Date   Anxiety    situational   DDD (degenerative disc disease), lumbar    Elevated white blood cell count    Hyperlipidemia    Hypertension    Pneumonia     Medications:  Infusions:   diltiazem (CARDIZEM) infusion 10 mg/hr (12/01/22 1647)    Assessment: 2 yom presented to the ED with weakness and afib. To start IV heparin. Baseline CBC is ok and he is not on anticoagulation PTA.  Plan to change heparin to apixaban today.  Age<80, wt >60kg Scr <1.5  Goal of Therapy:  Monitor platelets by anticoagulation protocol: Yes   Plan:  Dc heparin Apixaban 5mg  PO BID Copay check in AM Rx will follow peripherally  Onnie Boer, PharmD, BCIDP, AAHIVP, CPP Infectious Disease Pharmacist 12/01/2022 5:28 PM

## 2022-12-01 NOTE — ED Triage Notes (Signed)
Pt went to PCP this am with c/o weakness, lethargy for 3 weeks-a month. Was told he was in afib. Last May, also had afib with PNA and was told to see a cardiologist. That did not happen. No c/o pain.

## 2022-12-01 NOTE — Progress Notes (Signed)
ANTICOAGULATION CONSULT NOTE - Initial Consult  Pharmacy Consult for heparin Indication: atrial fibrillation  Allergies  Allergen Reactions   Penicillins Other (See Comments)    Occurred as child not sure what type of reaction he had   Tramadol Other (See Comments)    Patient states it made him feel like he had the flu    Patient Measurements: Height: 5\' 11"  (180.3 cm) Weight: 91.6 kg (202 lb) IBW/kg (Calculated) : 75.3 Heparin Dosing Weight: 91.6kg  Vital Signs: Temp: 98.2 F (36.8 C) (03/18 0945) Temp Source: Oral (03/18 0945) BP: 151/88 (03/18 1100) Pulse Rate: 101 (03/18 1100)  Labs: Recent Labs    12/01/22 1010  HGB 13.3  HCT 37.3*  PLT 233  CREATININE 0.99  TROPONINIHS 11    Estimated Creatinine Clearance: 86.1 mL/min (by C-G formula based on SCr of 0.99 mg/dL).   Medical History: Past Medical History:  Diagnosis Date   Anxiety    situational   DDD (degenerative disc disease), lumbar    Elevated white blood cell count    Hyperlipidemia    Hypertension    Pneumonia     Medications:  Infusions:   diltiazem (CARDIZEM) infusion     heparin      Assessment: 4 yom presented to the ED with weakness and afib. To start IV heparin. Baseline CBC is ok and he is not on anticoagulation PTA.  Goal of Therapy:  Heparin level 0.3-0.7 units/ml Monitor platelets by anticoagulation protocol: Yes   Plan:  Heparin bolus 5500 units IV x 1 Heparin gtt 1300 units/hr Check a 6 hr heparin level Daily heparin level and CBC  Kerie Badger, Rande Lawman 12/01/2022,11:20 AM

## 2022-12-01 NOTE — H&P (Signed)
History and Physical   Louis [name] [id number] DOB: [birth year] DOA: 12/01/2022  PCP: Dettinger, Fransisca Kaufmann, MD   Patient coming from: PCP  Chief Complaint: A-fib with RVR  HPI: Louis [name] is a 67 y.o. male with medical history significant of hypertension, hyperlipidemia paroxysmal A-fib presenting with A-fib with RVR.  Patient said 3 to 4 weeks of weakness and lethargy and presented to his PCP for further evaluation found to be in A-fib with RVR.  Sent to the ED for further evaluation.  Denies fevers, chills, chest pain, shortness of breath, abdominal pain, constipation, diarrhea, nausea, vomiting.  ED Course: Vital signs in the ED significant for heart rate in the 90s to 1 teens.  Lab workup included CMP with calcium of 10.5.  CBC with leukocytosis to 13.3.  BNP mildly elevated at 314, troponin negative x 2.  Ethanol level negative.  Chest x-ray without acute abnormality.  Patient received 1 dose IV metoprolol and started on a diltiazem drip in the ED.  Also started on heparin drip and given a dose of Ativan.  Review of Systems: As per HPI otherwise all other systems reviewed and are negative.  Past Medical History:  Diagnosis Date   Anxiety    situational   DDD (degenerative disc disease), lumbar    Elevated white blood cell count    Hyperlipidemia    Hypertension    Pneumonia     Past Surgical History:  Procedure Laterality Date   PAROTIDECTOMY Left 09/29/2022   Procedure: LEFT SUPERFICIAL PAROTIDECTOMY WITH FACIAL NERVE DISSECTION AND FACIAL NERVE MONITORING;  Surgeon: Izora Gala, MD;  Location: [facility];  Service: ENT;  Laterality: Left;   TONSILLECTOMY     TOTAL HIP ARTHROPLASTY Bilateral     Social History  reports that he quit smoking about 4 months ago. His smoking use included cigarettes. He started smoking about 38 years ago. He has a 15.00 pack-year smoking history. His smokeless tobacco use includes chew and snuff. He reports that he does not currently use  alcohol after a past usage of about 14.0 standard drinks of alcohol per week. He reports that he does not use drugs.  Allergies  Allergen Reactions   Penicillins Other (See Comments)    Occurred as child not sure what type of reaction he had   Tramadol Other (See Comments)    Patient states it made him feel like he had the flu    Family History  Problem Relation Age of Onset   Cancer Mother        breast  Reviewed on admission  Prior to Admission medications   Medication Sig Start Date End Date Taking? Authorizing Provider  diltiazem (CARDIZEM CD) 120 MG 24 hr capsule Take 1 capsule (120 mg total) by mouth daily. 01/30/22 01/30/23  Manuella Ghazi, Pratik D, DO  HYDROcodone-acetaminophen (NORCO) 7.5-325 MG tablet Take 1 tablet by mouth every 6 (six) hours as needed for moderate pain. 09/29/22   Izora Gala, MD  ibuprofen (ADVIL) 200 MG tablet Take 200 mg by mouth every 8 (eight) hours as needed for fever, headache or mild pain.    [provider]  lisinopril (ZESTRIL) 40 MG tablet Take 1 tablet (40 mg total) by mouth daily. 09/04/22   Dettinger, Fransisca Kaufmann, MD  pravastatin (PRAVACHOL) 20 MG tablet Take 1 tablet (20 mg total) by mouth daily. 09/04/22   Dettinger, Fransisca Kaufmann, MD  propranolol ER (INDERAL LA) 120 MG 24 hr capsule Take 1 capsule (120 mg total) by mouth daily.  Patient not taking: Reported on 12/01/2022 10/07/22   Chevis Pretty, FNP    Physical Exam: Vitals:   12/01/22 1500 12/01/22 1530 12/01/22 1632 12/01/22 1700  BP: (!) 148/97 (!) 158/87 (!) 164/90   Pulse: (!) 49 84 95 85  Resp: 19 15 16    Temp:   98.1 F (36.7 C)   TempSrc:   Oral   SpO2: 94% 97% 95% 97%  Weight:      Height:        Physical Exam Constitutional:      General: He is not in acute distress.    Appearance: Normal appearance.  HENT:     Head: Normocephalic and atraumatic.     Mouth/Throat:     Mouth: Mucous membranes are moist.     Pharynx: Oropharynx is clear.  Eyes:     Extraocular  Movements: Extraocular movements intact.     Pupils: Pupils are equal, round, and reactive to light.  Cardiovascular:     Rate and Rhythm: Normal rate. Rhythm irregular.     Pulses: Normal pulses.     Heart sounds: Normal heart sounds.  Pulmonary:     Effort: Pulmonary effort is normal. No respiratory distress.     Breath sounds: Normal breath sounds.  Abdominal:     General: Bowel sounds are normal. There is no distension.     Palpations: Abdomen is soft.     Tenderness: There is no abdominal tenderness.  Musculoskeletal:        General: No swelling or deformity.  Skin:    General: Skin is warm and dry.  Neurological:     General: No focal deficit present.     Mental Status: Mental status is at baseline.    Labs on Admission: I have personally reviewed following labs and imaging studies  CBC: Recent Labs  Lab 12/01/22 1010  WBC 13.3*  HGB 13.3  HCT 37.3*  MCV 89.4  PLT 0000000    Basic Metabolic Panel: Recent Labs  Lab 12/01/22 1010  NA 139  K 4.4  CL 100  CO2 27  GLUCOSE 96  BUN 22  CREATININE 0.99  CALCIUM 10.5*    GFR: Estimated Creatinine Clearance: 86.1 mL/min (by C-G formula based on SCr of 0.99 mg/dL).  Liver Function Tests: Recent Labs  Lab 12/01/22 1010  AST 29  ALT 35  ALKPHOS 77  BILITOT 1.1  PROT 7.3  ALBUMIN 4.2    Urine analysis: No results found for: "COLORURINE", "APPEARANCEUR", "LABSPEC", "PHURINE", "GLUCOSEU", "HGBUR", "BILIRUBINUR", "KETONESUR", "PROTEINUR", "UROBILINOGEN", "NITRITE", "LEUKOCYTESUR"  Radiological Exams on Admission: DG Chest Port 1 View  Result Date: 12/01/2022 CLINICAL DATA:  [id number] [facility]) [id number] EXAM: PORTABLE CHEST - 1 VIEW COMPARISON:  02/20/2022 FINDINGS: Cardiac silhouette is unremarkable. No pneumothorax or pleural effusion. The lungs are clear. Aorta is calcified. The visualized skeletal structures are unremarkable. IMPRESSION: No acute cardiopulmonary process. Electronically Signed   By: Sammie Bench M.D.   On: 12/01/2022 10:23    EKG: Independently reviewed.  Atrial fibrillation at 115 bpm incomplete/early right bundle branch block with QRS 112.  Assessment/Plan Principal Problem:   Paroxysmal A-fib (HCC) Active Problems:   HLD (hyperlipidemia)   HTN (hypertension)   Atrial fibrillation with RVR (HCC)   Paroxysmal A-fib A-fib with RVR > 3 to 4 weeks of weakness and lethargy went to PCP and found to be in A-fib with rates in the 110s. > Continue to be in the 110s in the ED despite initial IV metoprolol  dose and so was put on a diltiazem drip. > Has had prior episode of A-fib with RVR when he was admitted with pneumonia.  Recommendation at that time was diltiazem which she was prescribed and remains on and has been taken daily. > Troponin flat.  BNP 314 likely representing strain, but will repeat echo. > Started on heparin IV in the ED. - Monitor in progressive - Continue diltiazem drip, wean as tolerated - Plan to increase home diltiazem dose from 120 to 180 mg daily starting tomorrow - Stop IV heparin, start Eliquis - Echocardiogram - PRN low dose ativan for medical anxiety  Hypertension - Continue home lisinopril - Increase diltiazem as above  Hyperlipidemia - Continue home pravastatin  DVT prophylaxis: Eliquis Code Status:   Full Family Communication:  Updated at beside  Disposition Plan:   Patient is from:  Home  Anticipated DC to:  Home  Anticipated DC date:  2 days  Anticipated DC barriers: None  Consults called:  None Admission status:  Progressive, was accepted from med center as inpatient  Severity of Illness: The appropriate patient status for this patient is INPATIENT. Inpatient status is judged to be reasonable and necessary in order to provide the required intensity of service to ensure the patient's safety. The patient's presenting symptoms, physical exam findings, and initial radiographic and laboratory data in the context of their chronic  comorbidities is felt to place them at high risk for further clinical deterioration. Furthermore, it is not anticipated that the patient will be medically stable for discharge from the hospital within 2 midnights of admission.   * I certify that at the point of admission it is my clinical judgment that the patient will require inpatient hospital care spanning beyond 2 midnights from the point of admission due to high intensity of service, high risk for further deterioration and high frequency of surveillance required.Marcelyn Bruins MD [facility]  How to contact the [facility] Attending or Consulting provider [place] or covering provider during after hours [place], for this patient?   Check the care team in [facility] and look for a) attending/consulting [facility] provider listed and b) the [facility] team listed Log into [email or web address] and use [facility]'s universal password to access. If you do not have the password, please contact the hospital operator. Locate the [facility] provider you are looking for under [facility] and page to a number that you can be directly reached. If you still have difficulty reaching the provider, please page the [facility] (Director on Call) for the Hospitalists listed on amion for assistance.  12/01/2022, 5:18 PM

## 2022-12-02 ENCOUNTER — Other Ambulatory Visit (HOSPITAL_COMMUNITY): Payer: Self-pay

## 2022-12-02 ENCOUNTER — Inpatient Hospital Stay (HOSPITAL_COMMUNITY): Payer: PRIVATE HEALTH INSURANCE

## 2022-12-02 ENCOUNTER — Encounter: Payer: Self-pay | Admitting: Family Medicine

## 2022-12-02 DIAGNOSIS — I4891 Unspecified atrial fibrillation: Secondary | ICD-10-CM

## 2022-12-02 LAB — BASIC METABOLIC PANEL
Anion gap: 8 (ref 5–15)
BUN: 22 mg/dL (ref 8–23)
CO2: 29 mmol/L (ref 22–32)
Calcium: 9 mg/dL (ref 8.9–10.3)
Chloride: 101 mmol/L (ref 98–111)
Creatinine, Ser: 1.16 mg/dL (ref 0.61–1.24)
GFR, Estimated: >60 mL/min (ref >60)
Glucose, Bld: 109 mg/dL — ABNORMAL HIGH (ref 70–99)
Potassium: 3.9 mmol/L (ref 3.5–5.1)
Sodium: 138 mmol/L (ref 135–145)

## 2022-12-02 LAB — ECHOCARDIOGRAM COMPLETE
AR max vel: 2.8 cm2
AV Area VTI: 2.52 cm2
AV Area mean vel: 2.6 cm2
AV Mean grad: 6 mmHg
AV Peak grad: 11.3 mmHg
Ao pk vel: 1.68 m/s
Area-P 1/2: 2.56 cm2
Height: 71 in
S' Lateral: 3.1 cm
Weight: 3232 oz

## 2022-12-02 LAB — CBC
HCT: 32.1 % — ABNORMAL LOW (ref 39.0–52.0)
Hemoglobin: 11.7 g/dL — ABNORMAL LOW (ref 13.0–17.0)
MCH: 32.5 pg (ref 26.0–34.0)
MCHC: 36.4 g/dL — ABNORMAL HIGH (ref 30.0–36.0)
MCV: 89.2 fL (ref 80.0–100.0)
Platelets: 194 10*3/uL (ref 150–400)
RBC: 3.6 MIL/uL — ABNORMAL LOW (ref 4.22–5.81)
RDW: 12.3 % (ref 11.5–15.5)
WBC: 8.3 10*3/uL (ref 4.0–10.5)
nRBC: 0 % (ref 0.0–0.2)

## 2022-12-02 MED ORDER — DILTIAZEM HCL ER COATED BEADS 180 MG PO CP24
180.0000 mg | ORAL_CAPSULE | Freq: Every day | ORAL | Status: DC
  Administered 2022-12-02: 180 mg via ORAL
  Filled 2022-12-02: qty 1

## 2022-12-02 MED ORDER — DILTIAZEM HCL ER COATED BEADS 180 MG PO CP24: 180.0000 mg | ORAL_CAPSULE | Freq: Every day | ORAL | 2 refills | Status: DC

## 2022-12-02 MED ORDER — APIXABAN 5 MG PO TABS: 5.0000 mg | ORAL_TABLET | Freq: Two times a day (BID) | ORAL | 2 refills | Status: DC

## 2022-12-02 NOTE — TOC Benefit Eligibility Note (Signed)
Patient Teacher, English as a foreign language completed.    The patient is currently admitted and upon discharge could be taking Eliquis 5 mg.  The current 30 day co-pay is $25.00.   The patient is insured through [place], [place] Patient [place] Patient Advocate Team Direct Number: [phone]  Fax: [phone]

## 2022-12-02 NOTE — Discharge Instructions (Signed)

## 2022-12-02 NOTE — Progress Notes (Signed)
Echocardiogram 2D Echocardiogram has been performed.  Louis [name] 12/02/2022, 9:10 AM

## 2022-12-03 ENCOUNTER — Telehealth: Payer: Self-pay | Admitting: *Deleted

## 2022-12-03 ENCOUNTER — Encounter: Payer: Self-pay | Admitting: *Deleted

## 2022-12-03 NOTE — Transitions of Care (Post Inpatient/ED Visit) (Signed)
   12/03/2022  Name: Louis [name] MRN: [id number] DOB: [birth year]  Today's TOC FU Call Status: Today's TOC FU Call Status:: Unsuccessul Call (1st Attempt) Unsuccessful Call (1st Attempt) Date: 12/03/22  Attempted to reach the patient regarding the most recent Inpatient/ED visit.  Follow Up Plan: Additional outreach attempts will be made to reach the patient to complete the Transitions of Care (Post Inpatient/ED visit) call.   Chong Sicilian, BSN, RN-BC RN Care Coordinator [place] Direct Dial: [phone] Main #: [phone]

## 2022-12-03 NOTE — Transitions of Care (Post Inpatient/ED Visit) (Signed)
   12/03/2022  Name: Louis [name] MRN: [id number] DOB: [birth year]  Today's TOC FU Call Status: Today's TOC FU Call Status:: Successful TOC FU Call Competed TOC FU Call Complete Date: 12/03/22  Transition Care Management Follow-up Telephone Call Date of Discharge: 12/02/22 Discharge [facility] Type of Discharge: Inpatient Admission Primary Inpatient Discharge Diagnosis:: Paroxysmal Afib with RVR How have you been since you were released from the hospital?: Same Any questions or concerns?: No  Items Reviewed: Did you receive and understand the discharge instructions provided?: Yes Medications obtained and verified?: Yes (Medications Reviewed) (reviewed and reconiled) Any new allergies since your discharge?: No Dietary orders reviewed?: Yes Type of Diet Ordered:: heart healthy Do you have support at home?: Yes People in Home: spouse Name of Support/Comfort Primary Source: [facility] and Equipment/Supplies: [place] Ordered?: No Any new equipment or medical supplies ordered?: No  Functional Questionnaire: Do you need assistance with bathing/showering or dressing?: No Do you need assistance with meal preparation?: No Do you need assistance with eating?: No Do you have difficulty maintaining continence: No Do you need assistance with getting out of bed/getting out of a chair/moving?: No Do you have difficulty managing or taking your medications?: No  Follow up appointments reviewed: PCP Follow-up appointment confirmed?: Yes Date of PCP follow-up appointment?: 12/10/22 Follow-up Provider: [facility] Follow-up appointment confirmed?: NA Do you need transportation to your follow-up appointment?: No Do you understand care options if your condition(s) worsen?: Yes-patient verbalized understanding  SDOH Interventions Today    Flowsheet Row Most Recent Value  SDOH Interventions   Food Insecurity Interventions Intervention Not  Indicated  Housing Interventions Intervention Not Indicated  Transportation Interventions Intervention Not Indicated      Interventions Today    Flowsheet Row Most Recent Value  Chronic Disease   Chronic disease during today's visit Atrial Fibrillation (AFib)  General Interventions   General Interventions Discussed/Reviewed Labs, Durable Medical Equipment (DME)  [rpt labs at PCP visit per hosp discharge instructions]  Durable Medical Equipment (DME) BP Cuff  Exercise Interventions   Exercise Discussed/Reviewed Physical Activity  Physical Activity Discussed/Reviewed Physical Activity Discussed, Physical Activity Reviewed  Education Interventions   Education Provided Provided Education  Provided Verbal Education On When to see the doctor, Mental Health/Coping with Illness, Medication, Other  [thoroughly reviewed medications and what they are for. advised to monitor and record blood pressure and HR at least daily and take to PCP for review]  Mental Health Interventions   Mental Health Discussed/Reviewed Mental Health Discussed, Anxiety  [PCP to address]  Pharmacy Interventions   Pharmacy Dicussed/Reviewed Medications and their functions, Affording Medications  [given copay card for Eliquis]      TOC Interventions Today    Flowsheet Row Most Recent Value  TOC Interventions   TOC Interventions Discussed/Reviewed Arranged PCP follow up within 7 days/Care Guide scheduled, TOC Interventions Discussed, TOC Interventions Reviewed, Contacted provider for patient needs  [Pt requesting script for Ativan or something for anxiety. Also needs cardio referral and rpt bloodwork at appt]      Chong Sicilian, BSN, RN-BC [place]: [phone] Main #: [phone]

## 2022-12-03 NOTE — Discharge Summary (Signed)
Physician Discharge Summary  Louis [name] [id number] DOB: 1956-01-18 DOA: 12/01/2022  PCP: Dettinger, Fransisca Kaufmann, MD  Admit date: 12/01/2022 Discharge date: 12/03/2022  Admitted From: Home  Discharge disposition: Home  Recommendations for Outpatient Follow-Up:   Follow up with your primary care provider in one week.  Check CBC, BMP, magnesium in the next visit  Discharge Diagnosis:   Principal Problem:   Paroxysmal A-fib ([place]) Active Problems:   HLD (hyperlipidemia)   HTN (hypertension)   Atrial fibrillation with RVR ([place])   Discharge Condition: Improved.  Diet recommendation: Low sodium, heart healthy.    Wound care: None.  Code status: Full.   History of Present Illness:   Louis [name] is a 67 y.o. male with past medical history significant of hypertension, hyperlipidemia, paroxysmal A-fib presented to the hospital with weakness lethargy for 3 to 4 weeks.  Patient had gone to his primary care physician and was noted to have atrial fibrillation with an RVR.  She was then sent to the ED.  In the ED, patient was mildly tachycardic.    CBC with leukocytosis to 13.3.  BNP mildly elevated at 314, troponin negative x 2.  Ethanol level was negative.  Chest x-ray without acute abnormality.  Patient received 1 dose IV metoprolol and started on  diltiazem drip in the ED including heparin drip and was admitted to the hospital for further evaluation and treatment.  Hospital Course:   Following conditions were addressed during hospitalization as listed below,  Paroxysmal A-fib/A-fib with RVR Patient did have  previous history of atrial fibrillation and was recommended Cardizem.  Troponin flat at this time.  Was initially on Cardizem drip which has been increased from 120 mg to 180 mg on discharge.  Currently rate has been controlled.  Continue Eliquis on discharge.  2D echocardiogram showed LV ejection fraction of 60 to 65%.   Hypertension On increased dose of Cardizem.  Continue  lisinopril from home.   Hyperlipidemia Continue statin.  Disposition.  At this time, patient is stable for disposition home with outpatient PCP follow-up  Medical Consultants:   None.  Procedures:    None Subjective:   Today, patient seen and examined at bedside.  Denies any nausea vomiting fever chills or rigor.  Feels better.  Wants to go  Discharge Exam:   Vitals:   12/02/22 0705 12/02/22 0900  BP: (!) 150/81 123/71  Pulse: 69   Resp:    Temp:    SpO2:     Vitals:   12/01/22 2351 12/02/22 0335 12/02/22 0705 12/02/22 0900  BP: 111/72 (!) 119/54 (!) 150/81 123/71  Pulse: (!) 55 (!) 54 69   Resp: 18 18    Temp:  98.3 F (36.8 C)    TempSrc:  Oral    SpO2: 93% 94%    Weight:      Height:        General: Alert awake, not in obvious distress HENT: pupils equally reacting to light,  No scleral pallor or icterus noted. Oral mucosa is moist.  Chest:  Clear breath sounds.  Diminished breath sounds bilaterally. No crackles or wheezes.  CVS: S1 &S2 heard. No murmur.  Irregular rhythm. Abdomen: Soft, nontender, nondistended.  Bowel sounds are heard.   Extremities: No cyanosis, clubbing or edema.  Peripheral pulses are palpable. Psych: Alert, awake and oriented, normal mood CNS:  No cranial nerve deficits.  Power equal in all extremities.   Skin: Warm and dry.  No rashes noted.  The results of  significant diagnostics from this hospitalization (including imaging, microbiology, ancillary and laboratory) are listed below for reference.     Diagnostic Studies:   ECHOCARDIOGRAM COMPLETE  Result Date: 12/02/2022    ECHOCARDIOGRAM REPORT   Patient Name:   [facility] Date of Exam: 12/02/2022 Medical Rec #:  [id number]    Height:       71.0 in Accession #:    [id number]   Weight:       202.0 lb Date of Birth:  [birth year]    BSA:          2.117 m Patient Age:    64 years     BP:           150/81 mmHg Patient Gender: M            HR:           65 bpm. Exam Location:  Inpatient  Procedure: 2D Echo, Cardiac Doppler and Color Doppler Indications:    Atrial Fibrillation I48.91  History:        Patient has prior history of Echocardiogram examinations, most                 recent 01/29/2022. Arrythmias:Atrial Fibrillation; Risk                 Factors:Hypertension, Dyslipidemia and Current Smoker.  Sonographer:    Ronny Flurry Referring Phys: [id number] [place]  1. Left ventricular ejection fraction, by estimation, is 60 to 65%. The left ventricle has normal function. The left ventricle has no regional wall motion abnormalities. Left ventricular diastolic parameters were normal.  2. Right ventricular systolic function is normal. The right ventricular size is normal.  3. The mitral valve is normal in structure. No evidence of mitral valve regurgitation. No evidence of mitral stenosis.  4. The aortic valve is normal in structure. Aortic valve regurgitation is not visualized. No aortic stenosis is present.  5. The inferior vena cava is normal in size with greater than 50% respiratory variability, suggesting right atrial pressure of 3 mmHg. FINDINGS  Left Ventricle: Left ventricular ejection fraction, by estimation, is 60 to 65%. The left ventricle has normal function. The left ventricle has no regional wall motion abnormalities. The left ventricular internal cavity size was normal in size. There is  no left ventricular hypertrophy. Left ventricular diastolic parameters were normal. Right Ventricle: The right ventricular size is normal. No increase in right ventricular wall thickness. Right ventricular systolic function is normal. Left Atrium: Left atrial size was normal in size. Right Atrium: Right atrial size was normal in size. Pericardium: There is no evidence of pericardial effusion. Mitral Valve: The mitral valve is normal in structure. No evidence of mitral valve regurgitation. No evidence of mitral valve stenosis. Tricuspid Valve: The tricuspid valve is normal in  structure. Tricuspid valve regurgitation is not demonstrated. No evidence of tricuspid stenosis. Aortic Valve: The aortic valve is normal in structure. Aortic valve regurgitation is not visualized. No aortic stenosis is present. Aortic valve mean gradient measures 6.0 mmHg. Aortic valve peak gradient measures 11.3 mmHg. Aortic valve area, by VTI measures 2.52 cm. Pulmonic Valve: The pulmonic valve was normal in structure. Pulmonic valve regurgitation is not visualized. No evidence of pulmonic stenosis. Aorta: The aortic root is normal in size and structure. Venous: The inferior vena cava is normal in size with greater than 50% respiratory variability, suggesting right atrial pressure of 3 mmHg. IAS/Shunts: No atrial level shunt detected by color flow Doppler.  LEFT VENTRICLE PLAX 2D LVIDd:         4.30 cm   Diastology LVIDs:         3.10 cm   LV e' medial:    9.79 cm/s LV PW:         0.90 cm   LV E/e' medial:  6.8 LV IVS:        1.20 cm   LV e' lateral:   13.50 cm/s LVOT diam:     2.10 cm   LV E/e' lateral: 4.9 LV SV:         88 LV SV Index:   42 LVOT Area:     3.46 cm  RIGHT VENTRICLE             IVC RV S prime:     18.80 cm/s  IVC diam: 1.80 cm TAPSE (M-mode): 1.6 cm LEFT ATRIUM             Index        RIGHT ATRIUM           Index LA diam:        4.00 cm 1.89 cm/m   RA Area:     19.30 cm LA Vol (A2C):   49.8 ml 23.52 ml/m  RA Volume:   53.10 ml  25.08 ml/m LA Vol (A4C):   36.6 ml 17.28 ml/m LA Biplane Vol: 46.0 ml 21.72 ml/m  AORTIC VALVE AV Area (Vmax):    2.80 cm AV Area (Vmean):   2.60 cm AV Area (VTI):     2.52 cm AV Vmax:           168.00 cm/s AV Vmean:          108.000 cm/s AV VTI:            0.351 m AV Peak Grad:      11.3 mmHg AV Mean Grad:      6.0 mmHg LVOT Vmax:         136.00 cm/s LVOT Vmean:        81.200 cm/s LVOT VTI:          0.255 m LVOT/AV VTI ratio: 0.73  AORTA Ao Root diam: 3.70 cm MITRAL VALVE MV Area (PHT): 2.56 cm    SHUNTS MV Decel Time: 296 msec    Systemic VTI:  0.26 m MV E  velocity: 66.20 cm/s  Systemic Diam: 2.10 cm MV A velocity: 84.00 cm/s MV E/A ratio:  0.79 Aditya Sabharwal Electronically signed by Hebert Soho Signature Date/Time: 12/02/2022/9:34:38 AM    Final    DG Chest Port 1 View  Result Date: 12/01/2022 CLINICAL DATA:  [id number] A-fib [facility]) [id number] EXAM: PORTABLE CHEST - 1 VIEW COMPARISON:  02/20/2022 FINDINGS: Cardiac silhouette is unremarkable. No pneumothorax or pleural effusion. The lungs are clear. Aorta is calcified. The visualized skeletal structures are unremarkable. IMPRESSION: No acute cardiopulmonary process. Electronically Signed   By: Sammie Bench M.D.   On: 12/01/2022 10:23     Labs:   Basic Metabolic Panel: Recent Labs  Lab 12/01/22 1010 12/02/22 0157  NA 139 138  K 4.4 3.9  CL 100 101  CO2 27 29  GLUCOSE 96 109*  BUN 22 22  CREATININE 0.99 1.16  CALCIUM 10.5* 9.0   GFR Estimated Creatinine Clearance: 73.5 mL/min (by C-G formula based on SCr of 1.16 mg/dL). Liver Function Tests: Recent Labs  Lab 12/01/22 1010  AST 29  ALT 35  ALKPHOS 77  BILITOT 1.1  PROT  7.3  ALBUMIN 4.2   No results for input(s): "LIPASE", "AMYLASE" in the last 168 hours. No results for input(s): "AMMONIA" in the last 168 hours. Coagulation profile No results for input(s): "INR", "PROTIME" in the last 168 hours.  CBC: Recent Labs  Lab 12/01/22 1010 12/02/22 0157  WBC 13.3* 8.3  HGB 13.3 11.7*  HCT 37.3* 32.1*  MCV 89.4 89.2  PLT 233 194   Cardiac Enzymes: No results for input(s): "CKTOTAL", "CKMB", "CKMBINDEX", "TROPONINI" in the last 168 hours. BNP: Invalid input(s): "POCBNP" CBG: No results for input(s): "GLUCAP" in the last 168 hours. D-Dimer No results for input(s): "DDIMER" in the last 72 hours. Hgb A1c No results for input(s): "HGBA1C" in the last 72 hours. Lipid Profile No results for input(s): "CHOL", "HDL", "LDLCALC", "TRIG", "CHOLHDL", "LDLDIRECT" in the last 72 hours. Thyroid function studies No results for  input(s): "TSH", "T4TOTAL", "T3FREE", "THYROIDAB" in the last 72 hours.  Invalid input(s): "FREET3" Anemia work up No results for input(s): "VITAMINB12", "FOLATE", "FERRITIN", "TIBC", "IRON", "RETICCTPCT" in the last 72 hours. Microbiology No results found for this or any previous visit (from the past 240 hour(s)).   Discharge Instructions:   Discharge Instructions     Amb referral to AFIB Clinic   Complete by: As directed    Diet - low sodium heart healthy   Complete by: As directed    Discharge instructions   Complete by: As directed    Follow-up with your primary care provider in 1-2 week.  Check blood work at that time.  Discuss about getting referral to cardiology as outpatient.  Seek medical attention for worsening symptoms.  Avoid Advil while you are taking blood thinners.  Okay to take Tylenol if needed.   Increase activity slowly   Complete by: As directed       Allergies as of 12/02/2022       Reactions   Penicillins Other (See Comments)   Occurred as child not sure what type of reaction he had   Tramadol Other (See Comments)   Patient states it made him feel like he had the flu        Medication List     STOP taking these medications    ibuprofen 200 MG tablet Commonly known as: ADVIL       TAKE these medications    apixaban 5 MG Tabs tablet Commonly known as: ELIQUIS Take 1 tablet (5 mg total) by mouth 2 (two) times daily.   diltiazem 180 MG 24 hr capsule Commonly known as: Cardizem CD Take 1 capsule (180 mg total) by mouth daily. What changed:  medication strength how much to take   HYDROcodone-acetaminophen 7.5-325 MG tablet Commonly known as: Norco Take 1 tablet by mouth every 6 (six) hours as needed for moderate pain.   lisinopril 40 MG tablet Commonly known as: ZESTRIL Take 1 tablet (40 mg total) by mouth daily.   pravastatin 20 MG tablet Commonly known as: PRAVACHOL Take 1 tablet (20 mg total) by mouth daily.   propranolol ER  120 MG 24 hr capsule Commonly known as: Inderal LA Take 1 capsule (120 mg total) by mouth daily.          Time coordinating discharge: 39 minutes  Signed:  Cordella Nyquist  [facility] 12/03/2022, 2:37 PM

## 2022-12-10 ENCOUNTER — Encounter: Payer: Self-pay | Admitting: Family Medicine

## 2022-12-10 ENCOUNTER — Ambulatory Visit: Payer: PRIVATE HEALTH INSURANCE | Admitting: Family Medicine

## 2022-12-10 VITALS — BP 160/58 | HR 59 | Wt 202.0 lb

## 2022-12-10 DIAGNOSIS — F419 Anxiety disorder, unspecified: Secondary | ICD-10-CM | POA: Diagnosis not present

## 2022-12-10 DIAGNOSIS — I4891 Unspecified atrial fibrillation: Secondary | ICD-10-CM

## 2022-12-10 DIAGNOSIS — I10 Essential (primary) hypertension: Secondary | ICD-10-CM

## 2022-12-10 MED ORDER — BUSPIRONE HCL 10 MG PO TABS: 10.0000 mg | ORAL_TABLET | Freq: Two times a day (BID) | ORAL | 2 refills | Status: DC

## 2022-12-10 NOTE — Progress Notes (Signed)
BP (!) 160/58   Pulse (!) 59   Wt 202 lb (91.6 kg)   SpO2 99%   BMI 28.17 kg/m    Subjective:   Patient ID: Louis [name], male    DOB: 16-Apr-1956, 67 y.o.   MRN: [id number]  HPI: Louis [name] is a 67 y.o. male presenting on 12/10/2022 for Hospitalization Follow-up, Atrial Fibrillation, Shortness of Breath, Fatigue, and Anxiety (Requesting Ativan)   HPI Hospital follow-up and transition of care Patient was contacted by Trina Ao, RN for transition of care telephone call on 12/03/2022.  Patient was admitted on 12/01/2022 and discharged on 12/03/2002 for A-fib with RVR.  He was having lethargy and weakness for 3 to 4 weeks prior to admission and came to our office on 12/01/2022 and was sent to the emergency department due to symptomatic A-fib with RVR.  He was sent home with PCP follow-up.  They did keep him on Eliquis and increased his Cardizem to get him under rate control.  Patient's blood pressure and heart rate were up both in the office and in the hospital.  He does have an appointment with EP clinic.  He says his 83s it still is just that his energy is still down.  He denies any chest pain or palpitations or shortness of breath.  Patient is well and coming to discuss anxiety.  He is having increased anxiety and wants to know if there is something he can take.  He says it was something that we gave him in the past and it did not help and he has not taken it in a while.    09/04/2022    3:05 PM 02/05/2022    2:39 PM 07/25/2021   10:01 AM 07/23/2020   10:05 AM 02/03/2020    8:58 AM  Depression screen PHQ 2/9  Decreased Interest 1 1 0 0 0  Down, Depressed, Hopeless 1 1 0 0 0  PHQ - 2 Score 2 2 0 0 0  Altered sleeping 1 1     Tired, decreased energy 2 2     Change in appetite 0 0     Feeling bad or failure about yourself  0 0     Trouble concentrating 0 0     Moving slowly or fidgety/restless 0 0     Suicidal thoughts 0 0     PHQ-9 Score 5 5     Difficult doing work/chores Not  difficult at all Very difficult        Relevant past medical, surgical, family and social history reviewed and updated as indicated. Interim medical history since our last visit reviewed. Allergies and medications reviewed and updated.  Review of Systems  Constitutional:  Positive for fatigue. Negative for chills and fever.  Eyes:  Negative for visual disturbance.  Respiratory:  Negative for shortness of breath and wheezing.   Cardiovascular:  Negative for chest pain, palpitations and leg swelling.  Musculoskeletal:  Negative for back pain and gait problem.  Skin:  Negative for rash.  Neurological:  Positive for weakness.  All other systems reviewed and are negative.   Per HPI unless specifically indicated above   Allergies as of 12/10/2022       Reactions   Penicillins Other (See Comments)   Occurred as child not sure what type of reaction he had   Tramadol Other (See Comments)   Patient states it made him feel like he had the flu        Medication List  Accurate as of December 10, 2022  2:22 PM. If you have any questions, ask your nurse or doctor.          apixaban 5 MG Tabs tablet Commonly known as: ELIQUIS Take 1 tablet (5 mg total) by mouth 2 (two) times daily.   busPIRone 10 MG tablet Commonly known as: BUSPAR Take 1 tablet (10 mg total) by mouth 2 (two) times daily. Started by: Fransisca Kaufmann Lenae Wherley, MD   diltiazem 180 MG 24 hr capsule Commonly known as: Cardizem CD Take 1 capsule (180 mg total) by mouth daily.   HYDROcodone-acetaminophen 7.5-325 MG tablet Commonly known as: Norco Take 1 tablet by mouth every 6 (six) hours as needed for moderate pain.   lisinopril 40 MG tablet Commonly known as: ZESTRIL Take 1 tablet (40 mg total) by mouth daily.   pravastatin 20 MG tablet Commonly known as: PRAVACHOL Take 1 tablet (20 mg total) by mouth daily.   propranolol ER 120 MG 24 hr capsule Commonly known as: Inderal LA Take 1 capsule (120 mg total) by  mouth daily.         Objective:   BP (!) 160/58   Pulse (!) 59   Wt 202 lb (91.6 kg)   SpO2 99%   BMI 28.17 kg/m   Wt Readings from Last 3 Encounters:  12/10/22 202 lb (91.6 kg)  12/01/22 202 lb (91.6 kg)  12/01/22 202 lb (91.6 kg)    Physical Exam Vitals and nursing note reviewed.  Constitutional:      General: He is not in acute distress.    Appearance: He is well-developed. He is not diaphoretic.  Eyes:     General: No scleral icterus.    Conjunctiva/sclera: Conjunctivae normal.  Neck:     Thyroid: No thyromegaly.  Cardiovascular:     Rate and Rhythm: Normal rate. Rhythm irregular.     Heart sounds: Normal heart sounds. No murmur heard. Pulmonary:     Effort: Pulmonary effort is normal. No respiratory distress.     Breath sounds: Normal breath sounds. No wheezing.  Musculoskeletal:        General: No swelling. Normal range of motion.     Cervical back: Neck supple.  Lymphadenopathy:     Cervical: No cervical adenopathy.  Skin:    General: Skin is warm and dry.     Findings: No rash.  Neurological:     Mental Status: He is alert and oriented to person, place, and time.     Coordination: Coordination normal.  Psychiatric:        Behavior: Behavior normal.       Assessment & Plan:   Problem List Items Addressed This Visit       Cardiovascular and Mediastinum   Atrial fibrillation with RVR ([place]) - Primary   Relevant Orders   CBC with Differential/Platelet   CMP14+EGFR   Ambulatory referral to Cardiology   Other Visit Diagnoses     Essential hypertension       Relevant Orders   CMP14+EGFR   Ambulatory referral to Cardiology   Anxiety       Relevant Medications   busPIRone (BUSPAR) 10 MG tablet       Will refer to cardiology with the A-fib clinic.  Will start buspirone and see if that helps more with his anxiety.  He will follow-up with me in June, he already has an appointment then.  Follow-up with cardiology and A-fib clinic between now  and then. Follow up plan: Return  if symptoms worsen or fail to improve.  Counseling provided for all of the vaccine components Orders Placed This Encounter  Procedures   CBC with Differential/Platelet   CMP14+EGFR   Ambulatory referral to Cardiology    Caryl Pina, MD [facility] 12/10/2022, 2:22 PM

## 2022-12-11 LAB — CMP14+EGFR
ALT: 26 IU/L (ref 0–44)
AST: 24 IU/L (ref 0–40)
Albumin/Globulin Ratio: 2 (ref 1.2–2.2)
Albumin: 4.4 g/dL (ref 3.9–4.9)
Alkaline Phosphatase: 91 IU/L (ref 44–121)
BUN/Creatinine Ratio: 15 (ref 10–24)
BUN: 14 mg/dL (ref 8–27)
Bilirubin Total: <0.2 mg/dL (ref 0.0–1.2)
CO2: 28 mmol/L (ref 20–29)
Calcium: 9.1 mg/dL (ref 8.6–10.2)
Chloride: 105 mmol/L (ref 96–106)
Creatinine, Ser: 0.94 mg/dL (ref 0.76–1.27)
Globulin, Total: 2.2 g/dL (ref 1.5–4.5)
Glucose: 99 mg/dL (ref 70–99)
Potassium: 4.3 mmol/L (ref 3.5–5.2)
Sodium: 146 mmol/L — ABNORMAL HIGH (ref 134–144)
Total Protein: 6.6 g/dL (ref 6.0–8.5)
eGFR: 90 mL/min/{1.73_m2} (ref >59)

## 2022-12-11 LAB — CBC WITH DIFFERENTIAL/PLATELET
Basophils Absolute: 0.1 10*3/uL (ref 0.0–0.2)
Basos: 1 %
EOS (ABSOLUTE): 0.2 10*3/uL (ref 0.0–0.4)
Eos: 1 %
Hematocrit: 36.8 % — ABNORMAL LOW (ref 37.5–51.0)
Hemoglobin: 12.1 g/dL — ABNORMAL LOW (ref 13.0–17.7)
Immature Grans (Abs): 0 10*3/uL (ref 0.0–0.1)
Immature Granulocytes: 0 %
Lymphocytes Absolute: 2.9 10*3/uL (ref 0.7–3.1)
Lymphs: 21 %
MCH: 32.1 pg (ref 26.6–33.0)
MCHC: 32.9 g/dL (ref 31.5–35.7)
MCV: 98 fL — ABNORMAL HIGH (ref 79–97)
Monocytes Absolute: 1 10*3/uL — ABNORMAL HIGH (ref 0.1–0.9)
Monocytes: 7 %
Neutrophils Absolute: 9.6 10*3/uL — ABNORMAL HIGH (ref 1.4–7.0)
Neutrophils: 70 %
Platelets: 222 10*3/uL (ref 150–450)
RBC: 3.77 x10E6/uL — ABNORMAL LOW (ref 4.14–5.80)
RDW: 13.4 % (ref 11.6–15.4)
WBC: 13.7 10*3/uL — ABNORMAL HIGH (ref 3.4–10.8)

## 2022-12-15 NOTE — Progress Notes (Signed)
Pt r/c.

## 2022-12-24 ENCOUNTER — Ambulatory Visit (HOSPITAL_COMMUNITY)
Admission: RE | Admit: 2022-12-24 | Discharge: 2022-12-24 | Disposition: A | Discharge status: Home / Self Care | Payer: PRIVATE HEALTH INSURANCE | Source: Ambulatory Visit | Attending: Physician Assistant | Admitting: Physician Assistant

## 2022-12-24 ENCOUNTER — Encounter (HOSPITAL_COMMUNITY): Payer: Self-pay | Admitting: Physician Assistant

## 2022-12-24 ENCOUNTER — Inpatient Hospital Stay (HOSPITAL_COMMUNITY)
Admission: RE | Admit: 2022-12-24 | Discharge: 2022-12-24 | Disposition: A | Discharge status: Home / Self Care | Payer: PRIVATE HEALTH INSURANCE | Source: Ambulatory Visit | Attending: Physician Assistant | Admitting: Physician Assistant

## 2022-12-24 VITALS — BP 168/82 | HR 49 | Ht 71.0 in | Wt 202.0 lb

## 2022-12-24 DIAGNOSIS — R0683 Snoring: Secondary | ICD-10-CM | POA: Diagnosis not present

## 2022-12-24 DIAGNOSIS — Z7901 Long term (current) use of anticoagulants: Secondary | ICD-10-CM | POA: Diagnosis not present

## 2022-12-24 DIAGNOSIS — I10 Essential (primary) hypertension: Secondary | ICD-10-CM | POA: Diagnosis not present

## 2022-12-24 DIAGNOSIS — E785 Hyperlipidemia, unspecified: Secondary | ICD-10-CM | POA: Diagnosis not present

## 2022-12-24 DIAGNOSIS — I700 Atherosclerosis of aorta: Secondary | ICD-10-CM | POA: Insufficient documentation

## 2022-12-24 DIAGNOSIS — D6869 Other thrombophilia: Secondary | ICD-10-CM | POA: Diagnosis not present

## 2022-12-24 DIAGNOSIS — G250 Essential tremor: Secondary | ICD-10-CM | POA: Diagnosis not present

## 2022-12-24 DIAGNOSIS — I480 Paroxysmal atrial fibrillation: Secondary | ICD-10-CM | POA: Diagnosis present

## 2022-12-24 MED ORDER — PROPRANOLOL HCL ER 80 MG PO CP24: 80.0000 mg | ORAL_CAPSULE | Freq: Every day | ORAL | 3 refills | Status: DC

## 2022-12-24 NOTE — Patient Instructions (Signed)
Decrease propranolol to 80mg  once a day

## 2022-12-24 NOTE — Progress Notes (Signed)
Primary Care Physician: Dettinger, Elige RadonJoshua A, MD Primary Cardiologist: Dr Anne FuSkains (new) Primary Electrophysiologist: none Referring Physician: Dr Dettinger   Louis [name] is a 67 y.o. male with a history of HTN, CAD, HLD, tobacco abuse, atrial fibrillation who presents for consultation in the [facility].  The patient was initially diagnosed with atrial fibrillation 01/2022 in the setting on pneumonia. Given the brevity of the episode and potential trigger, he was not started on anticoagulation at that time. He presented to the ED from his PCP office on 12/01/22 with generalized weakness and was found to be back in afib. He was started on IV diltiazem and admitted. His diltiazem was increased from 120 to 180 mg. He was started on Eliquis for a CHADS2VASC score of 3.  Patient is in SR today with slow heart rates. He continues to feel fatigued. In hindsight, patient feels that his fatigue started at the time he started propranolol for his essential tremor. He denies bleeding issues on anticoagulation. He does occasionally wake himself up snoring but denies daytime somnolence. He drinks 10-12 alcoholic drinks weekly.   Today, he denies symptoms of palpitations, chest pain, shortness of breath, orthopnea, PND, lower extremity edema, dizziness, presyncope, syncope, daytime somnolence, bleeding, or neurologic sequela. The patient is tolerating medications without difficulties and is otherwise without complaint today.    Atrial Fibrillation Risk Factors:  he does have symptoms or diagnosis of sleep apnea. he does not have a history of rheumatic fever. he does have a history of alcohol use. The patient does not have a history of early familial atrial fibrillation or other arrhythmias.  he has a BMI of Body mass index is 28.17 kg/m.Marland Kitchen. Filed Weights   12/24/22 1338  Weight: 91.6 kg    Family History  Problem Relation Age of Onset   Cancer Mother        breast      Atrial Fibrillation Management history:  Previous antiarrhythmic drugs: none Previous cardioversions: none Previous ablations: none Anticoagulation history: Eliquis   Past Medical History:  Diagnosis Date   Anxiety    situational   DDD (degenerative disc disease), lumbar    Elevated white blood cell count    Hyperlipidemia    Hypertension    Pneumonia    Past Surgical History:  Procedure Laterality Date   PAROTIDECTOMY Left 09/29/2022   Procedure: LEFT SUPERFICIAL PAROTIDECTOMY WITH FACIAL NERVE DISSECTION AND FACIAL NERVE MONITORING;  Surgeon: Serena Colonelosen, Jefry, MD;  Location: [facility] OR;  Service: ENT;  Laterality: Left;   TONSILLECTOMY     TOTAL HIP ARTHROPLASTY Bilateral     Current Outpatient Medications  Medication Sig Dispense Refill   apixaban (ELIQUIS) 5 MG TABS tablet Take 1 tablet (5 mg total) by mouth 2 (two) times daily. 60 tablet 2   busPIRone (BUSPAR) 10 MG tablet Take 1 tablet (10 mg total) by mouth 2 (two) times daily. 60 tablet 2   diltiazem (CARDIZEM CD) 180 MG 24 hr capsule Take 1 capsule (180 mg total) by mouth daily. 30 capsule 2   HYDROcodone-acetaminophen (NORCO) 7.5-325 MG tablet Take 1 tablet by mouth every 6 (six) hours as needed for moderate pain. 20 tablet 0   lisinopril (ZESTRIL) 40 MG tablet Take 1 tablet (40 mg total) by mouth daily. 90 tablet 3   pravastatin (PRAVACHOL) 20 MG tablet Take 1 tablet (20 mg total) by mouth daily. 90 tablet 3   propranolol ER (INDERAL LA) 80 MG 24 hr capsule Take 1 capsule (  80 mg total) by mouth daily. 30 capsule 3   No current facility-administered medications for this encounter.    Allergies  Allergen Reactions   Penicillins Other (See Comments)    Occurred as child not sure what type of reaction he had   Tramadol Other (See Comments)    Patient states it made him feel like he had the flu    Social History   Socioeconomic History   Marital status: Married    Spouse name: Not on file   Number of children:  Not on file   Years of education: Not on file   Highest education level: Not on file  Occupational History   Not on file  Tobacco Use   Smoking status: Former    Packs/day: 0.50    Years: 30.00    Additional pack years: 0.00    Total pack years: 15.00    Types: Cigarettes    Start date: 08/17/1984    Quit date: 07/2022    Years since quitting: 0.4   Smokeless tobacco: Current    Types: Chew, Snuff   Tobacco comments:    Zen - nicotene pouch 4mg  of nicotene  Vaping Use   Vaping Use: Never used  Substance and Sexual Activity   Alcohol use: Yes    Alcohol/week: 10.0 - 12.0 standard drinks of alcohol    Types: 10 - 12 Standard drinks or equivalent per week    Comment: 10-12 mixed drinks weekly 12/24/22   Drug use: No   Sexual activity: Not on file  Other Topics Concern   Not on file  Social History Narrative   Not on file   Social Determinants of Health   Financial Resource Strain: Not on file  Food Insecurity: No Food Insecurity (12/03/2022)   Hunger Vital Sign    Worried About Running Out of Food in the Last Year: Never true    Ran Out of Food in the Last Year: Never true  Transportation Needs: No Transportation Needs (12/03/2022)   PRAPARE - Administrator, Civil Service (Medical): No    Lack of Transportation (Non-Medical): No  Physical Activity: Not on file  Stress: Not on file  Social Connections: Not on file  Intimate Partner Violence: Not At Risk (12/01/2022)   Humiliation, Afraid, Rape, and Kick questionnaire    Fear of Current or Ex-Partner: No    Emotionally Abused: No    Physically Abused: No    Sexually Abused: No     ROS- All systems are reviewed and negative except as per the HPI above.  Physical Exam: Vitals:   12/24/22 1338  BP: (!) 168/82  Pulse: (!) 49  Weight: 91.6 kg  Height: 5\' 11"  (1.803 m)    GEN- The patient is a well appearing male, alert and oriented x 3 today.   Head- normocephalic, atraumatic Eyes-  Sclera clear,  conjunctiva pink Ears- hearing intact Oropharynx- clear Neck- supple  Lungs- Clear to ausculation bilaterally, normal work of breathing Heart- Regular rate and rhythm, no murmurs, rubs or gallops  GI- soft, NT, ND, + BS Extremities- no clubbing, cyanosis, or edema MS- no significant deformity or atrophy Skin- no rash or lesion Psych- euthymic mood, full affect Neuro- strength and sensation are intact  Wt Readings from Last 3 Encounters:  12/24/22 91.6 kg  12/10/22 91.6 kg  12/01/22 91.6 kg    EKG today demonstrates  SB, PACs Vent. rate 49 BPM PR interval 154 ms QRS duration 94 ms QT/QTcB  476/429 ms  Echo 12/02/22 demonstrated 1. Left ventricular ejection fraction, by estimation, is 60 to 65%. The  left ventricle has normal function. The left ventricle has no regional  wall motion abnormalities. Left ventricular diastolic parameters were  normal.   2. Right ventricular systolic function is normal. The right ventricular  size is normal.   3. The mitral valve is normal in structure. No evidence of mitral valve  regurgitation. No evidence of mitral stenosis.   4. The aortic valve is normal in structure. Aortic valve regurgitation is  not visualized. No aortic stenosis is present.   5. The inferior vena cava is normal in size with greater than 50%  respiratory variability, suggesting right atrial pressure of 3 mmHg.   Epic records are reviewed at length today.  CHA2DS2-VASc Score = 3  The patient's score is based upon: CHF History: 0 HTN History: 1 Diabetes History: 0 Stroke History: 0 Vascular Disease History: 1 (aortic atherosclerosis) Age Score: 1 Gender Score: 0       ASSESSMENT AND PLAN: 1. Paroxysmal Atrial Fibrillation (ICD10:  I48.0) The patient's CHA2DS2-VASc score is 3, indicating a 3.2% annual risk of stroke.   General education about afib provided and questions answered. We also discussed his stroke risk and the risks and benefits of  anticoagulation. Continue diltiazem 180 mg daily Will decrease propranolol to 80 mg daily given bradycardia and fatigue. Hopefully his tremors won't increase.  Continue Eliquis 5 mg BID Will have him wear a 2 week Zio monitor to assess arrhythmia burden. If rhythm control is needed, would favor ablation over AAD.  We discussed reducing his alcohol intake to 3 drinks per week or less.   2. Secondary Hypercoagulable State (ICD10:  D68.69) The patient is at significant risk for stroke/thromboembolism based upon his CHA2DS2-VASc Score of 3.  Continue Apixaban (Eliquis).   3. HTN Elevated today, better on recheck. Patient is very anxious about today's visit.  Med changes as above. Reassess at next visit.   4. Snoring The importance of adequate treatment of sleep apnea was discussed today in order to improve our ability to maintain sinus rhythm long term. Patient has deferred sleep study for now.   5. Aortic atherosclerosis Noted on CT 2017 On statin    Follow up with Dr Anne Fu as scheduled to establish care. AF clinic in 3 months, sooner if monitor shows high afib burden.    Jorja Loa PA-C Afib Clinic [facility] [address] [place], [place] [address] [phone] 12/24/2022 3:14 PM

## 2023-01-02 ENCOUNTER — Telehealth (HOSPITAL_COMMUNITY): Payer: Self-pay

## 2023-01-02 NOTE — Telephone Encounter (Signed)
Patient called to let us know his heart monitor has come off. He was only able to wear the monitor 8 1/2 days. Consulted with Eyvonne Left he was okay with the short period he was able to wear the monitor. Patient advised to mail back his monitor and he was told we will contact him once we have the results. Communicated with patient and he verbalized understanding.

## 2023-01-03 ENCOUNTER — Other Ambulatory Visit: Payer: Self-pay | Admitting: Family Medicine

## 2023-01-03 DIAGNOSIS — F419 Anxiety disorder, unspecified: Secondary | ICD-10-CM

## 2023-01-12 NOTE — Addendum Note (Signed)
Encounter addended by: Shona Simpson, RN on: 01/12/2023 8:46 AM  Actions taken: Imaging Exam ended

## 2023-01-27 ENCOUNTER — Ambulatory Visit: Payer: PRIVATE HEALTH INSURANCE | Attending: Cardiology | Admitting: Cardiology

## 2023-01-27 ENCOUNTER — Encounter: Payer: Self-pay | Admitting: Cardiology

## 2023-01-27 VITALS — BP 152/60 | HR 51 | Ht 71.0 in | Wt 205.2 lb

## 2023-01-27 DIAGNOSIS — R072 Precordial pain: Secondary | ICD-10-CM

## 2023-01-27 DIAGNOSIS — I480 Paroxysmal atrial fibrillation: Secondary | ICD-10-CM | POA: Diagnosis not present

## 2023-01-27 DIAGNOSIS — Z01812 Encounter for preprocedural laboratory examination: Secondary | ICD-10-CM | POA: Diagnosis not present

## 2023-01-27 NOTE — Progress Notes (Signed)
Cardiology Office Note:    Date:  01/27/2023   ID:  Louis [name], DOB 1955-12-24, MRN [id number]  PCP:  Dettinger, Elige Radon, MD   [facility] Providers Cardiologist:  None     Referring MD: Dettinger, Elige Radon, MD    History of Present Illness:    Louis [name] is a 67 y.o. male here for evaluation of atrial fibrillation with rapid ventricular response at the request of Dr. Louanne Skye.  He has hypertension coronary disease hyperlipidemia tobacco use.  He was diagnosed with A-fib on 01/2022 in the setting of pneumonia.  This was short-lived episode.  He then went back to the emergency room almost a year later on 12/01/2022 with weakness and was found to be back in atrial fibrillation.  He was started on diltiazem and Eliquis given his CHA2DS2-VASc of 3.  Fatigue may have started at that time his propranolol was given for essential tremor.  He drinks 10-12 alcohol drinks weekly.  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  He states that he quit in late 2023.  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  He has been experiencing increased shortness of breath with activity.  No mucus production.  No bleeding.  Past Medical History:  Diagnosis Date   Anxiety    situational   DDD (degenerative disc disease), lumbar    Elevated white blood cell count    Hyperlipidemia    Hypertension    Pneumonia     Past Surgical History:  Procedure Laterality Date   PAROTIDECTOMY Left 09/29/2022   Procedure: LEFT SUPERFICIAL PAROTIDECTOMY WITH FACIAL NERVE DISSECTION AND FACIAL NERVE MONITORING;  Surgeon: Serena Colonel, MD;  Location: [facility] OR;  Service: ENT;  Laterality: Left;   TONSILLECTOMY     TOTAL HIP ARTHROPLASTY Bilateral     Current Medications: Current Meds  Medication Sig   apixaban (ELIQUIS) 5 MG TABS tablet Take 1 tablet (5 mg total) by mouth 2 (two) times daily.   diltiazem (CARDIZEM CD) 180 MG 24 hr capsule Take 1 capsule  (180 mg total) by mouth daily.   lisinopril (ZESTRIL) 40 MG tablet Take 1 tablet (40 mg total) by mouth daily.   pravastatin (PRAVACHOL) 20 MG tablet Take 1 tablet (20 mg total) by mouth daily.   propranolol ER (INDERAL LA) 80 MG 24 hr capsule Take 1 capsule (80 mg total) by mouth daily.     Allergies:   Penicillins and Tramadol   Social History   Socioeconomic History   Marital status: Married    Spouse name: Not on file   Number of children: Not on file   Years of education: Not on file   Highest education level: Not on file  Occupational History   Not on file  Tobacco Use   Smoking status: Former    Packs/day: 0.50    Years: 30.00    Additional pack years: 0.00    Total pack years: 15.00    Types: Cigarettes    Start date: 08/17/1984    Quit date: 07/2022    Years since quitting: 0.5   Smokeless tobacco: Current    Types: Chew, Snuff   Tobacco comments:    Zen - nicotene pouch 4mg  of nicotene  Vaping Use   Vaping Use: Never used  Substance and Sexual Activity   Alcohol use: Yes    Alcohol/week: 10.0 - 12.0 standard drinks of alcohol  Types: 10 - 12 Standard drinks or equivalent per week    Comment: 10-12 mixed drinks weekly 12/24/22   Drug use: No   Sexual activity: Not on file  Other Topics Concern   Not on file  Social History Narrative   Not on file   Social Determinants of Health   Financial Resource Strain: Not on file  Food Insecurity: No Food Insecurity (12/03/2022)   Hunger Vital Sign    Worried About Running Out of Food in the Last Year: Never true    Ran Out of Food in the Last Year: Never true  Transportation Needs: No Transportation Needs (12/03/2022)   PRAPARE - Administrator, Civil Service (Medical): No    Lack of Transportation (Non-Medical): No  Physical Activity: Not on file  Stress: Not on file  Social Connections: Not on file     Family History: The patient's family history includes Cancer in his mother.  ROS:   Please  see the history of present illness.    No fevers chills nausea vomiting syncope bleeding.  All other systems reviewed and are negative.  EKGs/Labs/Other Studies Reviewed:    The following studies were reviewed today: Cardiac Studies & Procedures       ECHOCARDIOGRAM  ECHOCARDIOGRAM COMPLETE 12/02/2022  Narrative ECHOCARDIOGRAM REPORT    Patient Name:   [name] Date of Exam: 12/02/2022 Medical Rec #:  [id number]    Height:       71.0 in Accession #:    [id number]   Weight:       202.0 lb Date of Birth:  04/20/1956    BSA:          2.117 m Patient Age:    66 years     BP:           150/81 mmHg Patient Gender: M            HR:           65 bpm. Exam Location:  Inpatient  Procedure: 2D Echo, Cardiac Doppler and Color Doppler  Indications:    Atrial Fibrillation I48.91  History:        Patient has prior history of Echocardiogram examinations, most recent 01/29/2022. Arrythmias:Atrial Fibrillation; Risk Factors:Hypertension, Dyslipidemia and Current Smoker.  Sonographer:    Lucendia Herrlich Referring Phys: [id number] Lyn Hollingshead B [name]  IMPRESSIONS   1. Left ventricular ejection fraction, by estimation, is 60 to 65%. The left ventricle has normal function. The left ventricle has no regional wall motion abnormalities. Left ventricular diastolic parameters were normal. 2. Right ventricular systolic function is normal. The right ventricular size is normal. 3. The mitral valve is normal in structure. No evidence of mitral valve regurgitation. No evidence of mitral stenosis. 4. The aortic valve is normal in structure. Aortic valve regurgitation is not visualized. No aortic stenosis is present. 5. The inferior vena cava is normal in size with greater than 50% respiratory variability, suggesting right atrial pressure of 3 mmHg.  FINDINGS Left Ventricle: Left ventricular ejection fraction, by estimation, is 60 to 65%. The left ventricle has normal function. The left ventricle has no  regional wall motion abnormalities. The left ventricular internal cavity size was normal in size. There is no left ventricular hypertrophy. Left ventricular diastolic parameters were normal.  Right Ventricle: The right ventricular size is normal. No increase in right ventricular wall thickness. Right ventricular systolic function is normal.  Left Atrium: Left atrial size was normal in size.  Right Atrium:  Right atrial size was normal in size.  Pericardium: There is no evidence of pericardial effusion.  Mitral Valve: The mitral valve is normal in structure. No evidence of mitral valve regurgitation. No evidence of mitral valve stenosis.  Tricuspid Valve: The tricuspid valve is normal in structure. Tricuspid valve regurgitation is not demonstrated. No evidence of tricuspid stenosis.  Aortic Valve: The aortic valve is normal in structure. Aortic valve regurgitation is not visualized. No aortic stenosis is present. Aortic valve mean gradient measures 6.0 mmHg. Aortic valve peak gradient measures 11.3 mmHg. Aortic valve area, by VTI measures 2.52 cm.  Pulmonic Valve: The pulmonic valve was normal in structure. Pulmonic valve regurgitation is not visualized. No evidence of pulmonic stenosis.  Aorta: The aortic root is normal in size and structure.  Venous: The inferior vena cava is normal in size with greater than 50% respiratory variability, suggesting right atrial pressure of 3 mmHg.  IAS/Shunts: No atrial level shunt detected by color flow Doppler.   LEFT VENTRICLE PLAX 2D LVIDd:         4.30 cm   Diastology LVIDs:         3.10 cm   LV e' medial:    9.79 cm/s LV PW:         0.90 cm   LV E/e' medial:  6.8 LV IVS:        1.20 cm   LV e' lateral:   13.50 cm/s LVOT diam:     2.10 cm   LV E/e' lateral: 4.9 LV SV:         88 LV SV Index:   42 LVOT Area:     3.46 cm   RIGHT VENTRICLE             IVC RV S prime:     18.80 cm/s  IVC diam: 1.80 cm TAPSE (M-mode): 1.6 cm  LEFT ATRIUM              Index        RIGHT ATRIUM           Index LA diam:        4.00 cm 1.89 cm/m   RA Area:     19.30 cm LA Vol (A2C):   49.8 ml 23.52 ml/m  RA Volume:   53.10 ml  25.08 ml/m LA Vol (A4C):   36.6 ml 17.28 ml/m LA Biplane Vol: 46.0 ml 21.72 ml/m AORTIC VALVE AV Area (Vmax):    2.80 cm AV Area (Vmean):   2.60 cm AV Area (VTI):     2.52 cm AV Vmax:           168.00 cm/s AV Vmean:          108.000 cm/s AV VTI:            0.351 m AV Peak Grad:      11.3 mmHg AV Mean Grad:      6.0 mmHg LVOT Vmax:         136.00 cm/s LVOT Vmean:        81.200 cm/s LVOT VTI:          0.255 m LVOT/AV VTI ratio: 0.73  AORTA Ao Root diam: 3.70 cm  MITRAL VALVE MV Area (PHT): 2.56 cm    SHUNTS MV Decel Time: 296 msec    Systemic VTI:  0.26 m MV E velocity: 66.20 cm/s  Systemic Diam: 2.10 cm MV A velocity: 84.00 cm/s MV E/A ratio:  0.79  Aditya Sabharwal  Electronically signed by Dorthula Nettles Signature Date/Time: 12/02/2022/9:34:38 AM    Final    MONITORS  LONG TERM MONITOR (3-14 DAYS) 01/12/2023  Narrative Patch Wear Time:  9 days and 2 hours  Predominant rhythm was sinus rhythm 7.5% supraventricular ectopy Less than 1% ventricular ectopy 3% atrial fibrillation burden, heart rate 31 to 138 bpm (average 75 bpm), longest episode 3 hours 24 minutes No triggered episodes recorded  Will Camnitz, MD            EKG: Prior EKG shows sinus rhythm heart rate 49 bpm with PAC  Recent Labs: 01/29/2022: TSH 0.419 01/30/2022: Magnesium 1.8 12/01/2022: B Natriuretic Peptide 314.0 12/10/2022: ALT 26; BUN 14; Creatinine, Ser 0.94; Hemoglobin 12.1; Platelets 222; Potassium 4.3; Sodium 146  Recent Lipid Panel    Component Value Date/Time   CHOL 156 09/04/2022 1541   TRIG 109 09/04/2022 1541   HDL 65 09/04/2022 1541   CHOLHDL 2.4 09/04/2022 1541   LDLCALC 72 09/04/2022 1541     Risk Assessment/Calculations:    CHA2DS2-VASc Score = 3   This indicates a 3.2% annual risk of  stroke. The patient's score is based upon: CHF History: 0 HTN History: 1 Diabetes History: 0 Stroke History: 0 Vascular Disease History: 1 (aortic atherosclerosis) Age Score: 1 Gender Score: 0              Physical Exam:    VS:  BP (!) 152/60   Pulse (!) 51   Ht 5\' 11"  (1.803 m)   Wt 205 lb 3.2 oz (93.1 kg)   SpO2 99%   BMI 28.62 kg/m     Wt Readings from Last 3 Encounters:  01/27/23 205 lb 3.2 oz (93.1 kg)  12/24/22 202 lb (91.6 kg)  12/10/22 202 lb (91.6 kg)     GEN:  Well nourished, well developed in no acute distress HEENT: Normal NECK: No JVD; No carotid bruits LYMPHATICS: No lymphadenopathy CARDIAC: RRR, no murmurs, rubs, gallops RESPIRATORY:  Clear to auscultation without rales, wheezing or rhonchi  ABDOMEN: Soft, non-tender, non-distended MUSCULOSKELETAL:  No edema; No deformity  SKIN: Warm and dry NEUROLOGIC:  Alert and oriented x 3 PSYCHIATRIC:  Normal affect   ASSESSMENT:    1. Precordial pain   2. Paroxysmal A-fib (HCC)   3. Pre-procedure lab exam    PLAN:    In order of problems listed above:  Paroxysmal atrial fibrillation -Eliquis 5 mg twice a day diltiazem 180 propranolol ER. -Continue to monitor blood work.  Prior hemoglobin was 12.1, creatinine 0.9.  Hemoglobin A1c 5.4. -3% burden on long-term monitor.  If increase in burden or symptomatology, consider ablation.  For now continue with current treatment strategy.  Chronic anticoagulation - Continue with Eliquis.  No bleeding.  Aortic atherosclerosis - Currently on pravastatin 20 mg a day.  Angina/shortness of breath - With his longstanding smoking history, quit in late 2023, increasing shortness of breath with activity, no mucus production, this could be his anginal equivalent.  We will go ahead and check a coronary CT scan to make sure that he does not have any severe flow-limiting disease.          Medication Adjustments/Labs and Tests Ordered: Current medicines are reviewed  at length with the patient today.  Concerns regarding medicines are outlined above.  Orders Placed This Encounter  Procedures   CT CORONARY MORPH W/CTA COR W/SCORE W/CA W/CM &/OR WO/CM   Basic metabolic panel   No orders of the defined types were placed in  this encounter.   Patient Instructions  Medication Instructions:  The current medical regimen is effective;  continue present plan and medications.  *If you need a refill on your cardiac medications before your next appointment, please call your pharmacy*   Lab Work: Please have blood work today (BMP)  If you have labs (blood work) drawn today and your tests are completely normal, you will receive your results only by: MyChart Message (if you have MyChart) OR A paper copy in the mail If you have any lab test that is abnormal or we need to change your treatment, we will call you to review the results.   Testing/Procedures:   Your cardiac CT will be scheduled at:  [facility] [address] [place], [place] [address] [phone]  Please arrive at the [facility] and Children's Entrance (Entrance C2) of [facility] 30 minutes prior to test start time. You can use the FREE valet parking offered at entrance C (encouraged to control the heart rate for the test)  Proceed to the [facility] Radiology Department (first floor) to check-in and test prep.  All radiology patients and guests should use entrance C2 at [facility], accessed from [facility], even though the hospital's physical address listed is [address]     Please follow these instructions carefully (unless otherwise directed):  Hold all erectile dysfunction medications at least 3 days (72 hrs) prior to test. (Ie viagra, cialis, sildenafil, tadalafil, etc) We will administer nitroglycerin during this exam.   On the Night Before the Test: Be sure to Drink plenty of water. Do not consume any  caffeinated/decaffeinated beverages or chocolate 12 hours prior to your test. Do not take any antihistamines 12 hours prior to your test.  On the Day of the Test: Drink plenty of water until 1 hour prior to the test. Do not eat any food 1 hour prior to test. You may take your regular medications prior to the test.  Take metoprolol (Lopressor) two hours prior to test. If you take Furosemide/Hydrochlorothiazide/Spironolactone, please HOLD on the morning of the test.  After the Test: Drink plenty of water. After receiving IV contrast, you may experience a mild flushed feeling. This is normal. On occasion, you may experience a mild rash up to 24 hours after the test. This is not dangerous. If this occurs, you can take Benadryl 25 mg and increase your fluid intake. If you experience trouble breathing, this can be serious. If it is severe call 911 IMMEDIATELY. If it is mild, please call our office. If you take any of these medications: Glipizide/Metformin, Avandament, Glucavance, please do not take 48 hours after completing test unless otherwise instructed.  We will call to schedule your test 2-4 weeks out understanding that some insurance companies will need an authorization prior to the service being performed.   For non-scheduling related questions, please contact the cardiac imaging nurse navigator should you have any questions/concerns: Rockwell Alexandria, Cardiac Imaging Nurse Navigator Larey Brick, Cardiac Imaging Nurse Navigator [place] [facility] Direct Office Dial: [phone]   For scheduling needs, including cancellations and rescheduling, please call [place], [phone].    Follow-Up: At [facility], you and your health needs are our priority.  As part of our continuing mission to provide you with exceptional heart care, we have created designated Provider Care Teams.  These Care Teams include your primary Cardiologist (physician) and Advanced  Practice Providers (APPs -  Physician Assistants and Nurse  Practitioners) who all work together to provide you with the care you need, when you need it.  We recommend signing up for the patient portal called "MyChart".  Sign up information is provided on this After Visit Summary.  MyChart is used to connect with patients for Virtual Visits (Telemedicine).  Patients are able to view lab/test results, encounter notes, upcoming appointments, etc.  Non-urgent messages can be sent to your provider as well.   To learn more about what you can do with MyChart, go to [email or web address].    Your next appointment:   6 month(s)  Provider:   Jari Favre, PA-C, Robin Searing, NP, Jacolyn Reedy, PA-C, Eligha Bridegroom, NP, or Tereso Newcomer, PA-C            Signed, Donato Schultz, MD  01/27/2023 3:51 PM    [facility]

## 2023-01-27 NOTE — Patient Instructions (Signed)
Medication Instructions:  The current medical regimen is effective;  continue present plan and medications.  *If you need a refill on your cardiac medications before your next appointment, please call your pharmacy*   Lab Work: Please have blood work today (BMP)  If you have labs (blood work) drawn today and your tests are completely normal, you will receive your results only by: MyChart Message (if you have MyChart) OR A paper copy in the mail If you have any lab test that is abnormal or we need to change your treatment, we will call you to review the results.   Testing/Procedures:   Your cardiac CT will be scheduled at:  [facility] [address] [place], [place] [address] [phone]  Please arrive at the [facility] and Children's Entrance (Entrance C2) of [facility] 30 minutes prior to test start time. You can use the FREE valet parking offered at entrance C (encouraged to control the heart rate for the test)  Proceed to the [facility] Radiology Department (first floor) to check-in and test prep.  All radiology patients and guests should use entrance C2 at [facility], accessed from [facility], even though the hospital's physical address listed is [address]     Please follow these instructions carefully (unless otherwise directed):  Hold all erectile dysfunction medications at least 3 days (72 hrs) prior to test. (Ie viagra, cialis, sildenafil, tadalafil, etc) We will administer nitroglycerin during this exam.   On the Night Before the Test: Be sure to Drink plenty of water. Do not consume any caffeinated/decaffeinated beverages or chocolate 12 hours prior to your test. Do not take any antihistamines 12 hours prior to your test.  On the Day of the Test: Drink plenty of water until 1 hour prior to the test. Do not eat any food 1 hour prior to test. You may take your regular medications prior to the test.  Take  metoprolol (Lopressor) two hours prior to test. If you take Furosemide/Hydrochlorothiazide/Spironolactone, please HOLD on the morning of the test.  After the Test: Drink plenty of water. After receiving IV contrast, you may experience a mild flushed feeling. This is normal. On occasion, you may experience a mild rash up to 24 hours after the test. This is not dangerous. If this occurs, you can take Benadryl 25 mg and increase your fluid intake. If you experience trouble breathing, this can be serious. If it is severe call 911 IMMEDIATELY. If it is mild, please call our office. If you take any of these medications: Glipizide/Metformin, Avandament, Glucavance, please do not take 48 hours after completing test unless otherwise instructed.  We will call to schedule your test 2-4 weeks out understanding that some insurance companies will need an authorization prior to the service being performed.   For non-scheduling related questions, please contact the cardiac imaging nurse navigator should you have any questions/concerns: Rockwell Alexandria, Cardiac Imaging Nurse Navigator Larey Brick, Cardiac Imaging Nurse Navigator [place] [facility] Direct Office Dial: [phone]   For scheduling needs, including cancellations and rescheduling, please call [place], [phone].    Follow-Up: At [facility], you and your health needs are our priority.  As part of our continuing mission to provide you with exceptional heart care, we have created designated Provider Care Teams.  These Care Teams include your primary Cardiologist (physician) and Advanced Practice Providers (APPs -  Physician Assistants and Nurse Practitioners) who all work together to provide you  with the care you need, when you need it.  We recommend signing up for the patient portal called "MyChart".  Sign up information is provided on this After Visit Summary.  MyChart is used to connect with patients for  Virtual Visits (Telemedicine).  Patients are able to view lab/test results, encounter notes, upcoming appointments, etc.  Non-urgent messages can be sent to your provider as well.   To learn more about what you can do with MyChart, go to [email or web address].    Your next appointment:   6 month(s)  Provider:   Jari Favre, PA-C, Robin Searing, NP, Jacolyn Reedy, PA-C, Eligha Bridegroom, NP, or Tereso Newcomer, PA-C

## 2023-01-28 LAB — BASIC METABOLIC PANEL
BUN/Creatinine Ratio: 17 (ref 10–24)
BUN: 17 mg/dL (ref 8–27)
CO2: 27 mmol/L (ref 20–29)
Calcium: 9.1 mg/dL (ref 8.6–10.2)
Chloride: 103 mmol/L (ref 96–106)
Creatinine, Ser: 1.02 mg/dL (ref 0.76–1.27)
Glucose: 99 mg/dL (ref 70–99)
Potassium: 4.6 mmol/L (ref 3.5–5.2)
Sodium: 144 mmol/L (ref 134–144)
eGFR: 82 mL/min/{1.73_m2} (ref >59)

## 2023-02-02 ENCOUNTER — Telehealth: Payer: Self-pay | Admitting: Family Medicine

## 2023-02-02 NOTE — Telephone Encounter (Signed)
Fax from [employer] RE: Eliquis - pt can receive 3 months at a zero copay Please send over if appropriate to [facility] pharmacy, In note or comment section please put [employer]

## 2023-02-04 ENCOUNTER — Telehealth (HOSPITAL_COMMUNITY): Payer: Self-pay | Admitting: *Deleted

## 2023-02-04 NOTE — Telephone Encounter (Signed)
Attempted to call patient regarding upcoming cardiac CT appointment. °Left message on voicemail with name and callback number ° °Saber Dickerman RN Navigator Cardiac Imaging °[place] [facility] °[phone] Office °[phone] Cell ° °

## 2023-02-04 NOTE — Telephone Encounter (Signed)
Pt is aware of provider feedback and he will call us back and let us know.

## 2023-02-04 NOTE — Telephone Encounter (Signed)
I spoke with his wife yesterday, informed her that she should contact the drug company directly and see if they have any prescription assistance programs.  If not then he will need a visit to come in so we can change medicines, Coumadin is really the cheapest option

## 2023-02-05 ENCOUNTER — Ambulatory Visit (HOSPITAL_COMMUNITY)
Admission: RE | Admit: 2023-02-05 | Discharge: 2023-02-05 | Disposition: A | Discharge status: Home / Self Care | Payer: PRIVATE HEALTH INSURANCE | Source: Ambulatory Visit | Attending: Cardiology | Admitting: Cardiology

## 2023-02-05 ENCOUNTER — Ambulatory Visit (HOSPITAL_BASED_OUTPATIENT_CLINIC_OR_DEPARTMENT_OTHER)
Admission: RE | Admit: 2023-02-05 | Discharge: 2023-02-05 | Disposition: A | Discharge status: Home / Self Care | Payer: PRIVATE HEALTH INSURANCE | Source: Ambulatory Visit | Attending: Cardiovascular Disease | Admitting: Cardiovascular Disease

## 2023-02-05 ENCOUNTER — Other Ambulatory Visit: Payer: Self-pay | Admitting: Cardiovascular Disease

## 2023-02-05 DIAGNOSIS — R072 Precordial pain: Secondary | ICD-10-CM

## 2023-02-05 DIAGNOSIS — I2510 Atherosclerotic heart disease of native coronary artery without angina pectoris: Secondary | ICD-10-CM

## 2023-02-05 DIAGNOSIS — R931 Abnormal findings on diagnostic imaging of heart and coronary circulation: Secondary | ICD-10-CM | POA: Diagnosis not present

## 2023-02-05 MED ORDER — NITROGLYCERIN 0.4 MG SL SUBL
0.8000 mg | SUBLINGUAL_TABLET | Freq: Once | SUBLINGUAL | Status: AC
  Administered 2023-02-05: 0.8 mg via SUBLINGUAL

## 2023-02-05 MED ORDER — IOHEXOL 350 MG/ML SOLN
95.0000 mL | Freq: Once | INTRAVENOUS | Status: AC | PRN
  Administered 2023-02-05: 95 mL via INTRAVENOUS

## 2023-02-05 MED ORDER — NITROGLYCERIN 0.4 MG SL SUBL
SUBLINGUAL_TABLET | SUBLINGUAL | Status: AC
  Filled 2023-02-05: qty 2

## 2023-02-06 ENCOUNTER — Telehealth: Payer: Self-pay | Admitting: Cardiology

## 2023-02-06 DIAGNOSIS — Z79899 Other long term (current) drug therapy: Secondary | ICD-10-CM

## 2023-02-06 DIAGNOSIS — I2510 Atherosclerotic heart disease of native coronary artery without angina pectoris: Secondary | ICD-10-CM

## 2023-02-06 DIAGNOSIS — R931 Abnormal findings on diagnostic imaging of heart and coronary circulation: Secondary | ICD-10-CM

## 2023-02-06 MED ORDER — ROSUVASTATIN CALCIUM 20 MG PO TABS: 20.0000 mg | ORAL_TABLET | Freq: Every day | ORAL | 3 refills | Status: DC

## 2023-02-06 MED ORDER — APIXABAN 5 MG PO TABS: 5.0000 mg | ORAL_TABLET | Freq: Two times a day (BID) | ORAL | 2 refills | Status: DC

## 2023-02-06 NOTE — Telephone Encounter (Signed)
Pt calling for lab results. He reviewed in mychart and would like to discuss the possible med change.

## 2023-02-06 NOTE — Telephone Encounter (Signed)
Advised of CT result/findings/recommendation. Pt agreeable to starting Crestor 20 mg daily. Will send Rx for this and Eliquis to [facility] pharmacy per pt request.  He will stop by the [facility] office 7/29 for fasting blood work, lipid panel. Patient verbalized understanding and agreeable to plan.   Will send Eliquis pt assistance phone number via mychart.  Pt may have cost issues and will call them if needed.

## 2023-02-10 ENCOUNTER — Telehealth: Payer: Self-pay | Admitting: Cardiology

## 2023-02-10 MED ORDER — ROSUVASTATIN CALCIUM 20 MG PO TABS: 20.0000 mg | ORAL_TABLET | Freq: Every day | ORAL | 3 refills | Status: DC

## 2023-02-10 NOTE — Telephone Encounter (Signed)
Pt's medication was sent to pt's pharmacy as requested. Confirmation received.  °

## 2023-02-10 NOTE — Telephone Encounter (Signed)
*  STAT* If patient is at the pharmacy, call can be transferred to refill team.   1. Which medications need to be refilled? (please list name of each medication and dose if known)  rosuvastatin (CRESTOR) 20 MG tablet   2. Which pharmacy/location (including street and city if local pharmacy) is medication to be sent to?  [id number] - [place], [place] - [address]   3. Do they need a 30 day or 90 day supply?   30 day  Patient stated he is completely out of this medication.

## 2023-02-12 ENCOUNTER — Telehealth: Payer: Self-pay | Admitting: Cardiology

## 2023-02-12 NOTE — Telephone Encounter (Signed)
Patient is calling back to receive results 

## 2023-02-12 NOTE — Telephone Encounter (Signed)
Returned pts call about lab results. Told pt repeat labs are 04/13/2023 to recheck lipid panel.

## 2023-03-02 ENCOUNTER — Other Ambulatory Visit: Payer: Self-pay

## 2023-03-02 MED ORDER — DILTIAZEM HCL ER COATED BEADS 180 MG PO CP24: 180.0000 mg | ORAL_CAPSULE | Freq: Every day | ORAL | 3 refills | Status: DC

## 2023-03-09 ENCOUNTER — Encounter: Payer: Self-pay | Admitting: Family Medicine

## 2023-03-09 ENCOUNTER — Ambulatory Visit: Payer: PRIVATE HEALTH INSURANCE | Admitting: Family Medicine

## 2023-03-09 VITALS — BP 100/64 | HR 54 | Ht 71.0 in | Wt 192.0 lb

## 2023-03-09 DIAGNOSIS — E782 Mixed hyperlipidemia: Secondary | ICD-10-CM

## 2023-03-09 DIAGNOSIS — I480 Paroxysmal atrial fibrillation: Secondary | ICD-10-CM | POA: Diagnosis not present

## 2023-03-09 DIAGNOSIS — I10 Essential (primary) hypertension: Secondary | ICD-10-CM

## 2023-03-09 NOTE — Progress Notes (Signed)
BP 100/64   Pulse (!) 54   Ht 5\' 11"  (1.803 m)   Wt 192 lb (87.1 kg)   SpO2 98%   BMI 26.78 kg/m    Subjective:   Patient ID: Louis [name], male    DOB: 07-24-1956, 67 y.o.   MRN: [id number]  HPI: Louis [name] is a 67 y.o. male presenting on 03/09/2023 for Medical Management of Chronic Issues, Hypertension, and Hyperlipidemia   HPI Hypertension Patient is currently on diltiazem and lisinopril propranolol, and their blood pressure today is 100/64. Patient denies any lightheadedness or dizziness. Patient denies headaches, blurred vision, chest pains, shortness of breath, or weakness. Denies any side effects from medication and is content with current medication.   Hyperlipidemia Patient is coming in for recheck of his hyperlipidemia. The patient is currently taking Crestor. They deny any issues with myalgias or history of liver damage from it. They deny any focal numbness or weakness or chest pain.   A-fib recheck Patient has paroxysmal A-fib and sees cardiology.  He is currently on Eliquis and Cardizem and propranolol.  He still gets somewhat short of breath and does not have his full energy back but is stable.  He continues to work with cardiology and sees them in a month.   Relevant past medical, surgical, family and social history reviewed and updated as indicated. Interim medical history since our last visit reviewed. Allergies and medications reviewed and updated.  Review of Systems  Constitutional:  Positive for fatigue. Negative for chills and fever.  Eyes:  Negative for visual disturbance.  Respiratory:  Negative for shortness of breath and wheezing.   Cardiovascular:  Negative for chest pain and leg swelling.  Musculoskeletal:  Negative for back pain and gait problem.  Skin:  Negative for rash.  Neurological:  Negative for dizziness, weakness and light-headedness.  All other systems reviewed and are negative.   Per HPI unless specifically indicated  above   Allergies as of 03/09/2023       Reactions   Penicillins Other (See Comments)   Occurred as child not sure what type of reaction he had   Tramadol Other (See Comments)   Patient states it made him feel like he had the flu        Medication List        Accurate as of March 09, 2023  3:02 PM. If you have any questions, ask your nurse or doctor.          STOP taking these medications    busPIRone 10 MG tablet Commonly known as: BUSPAR Stopped by: Elige Radon Caria Transue, MD       TAKE these medications    apixaban 5 MG Tabs tablet Commonly known as: ELIQUIS Take 1 tablet (5 mg total) by mouth 2 (two) times daily.   diltiazem 180 MG 24 hr capsule Commonly known as: Cardizem CD Take 1 capsule (180 mg total) by mouth daily.   lisinopril 40 MG tablet Commonly known as: ZESTRIL Take 1 tablet (40 mg total) by mouth daily.   propranolol ER 80 MG 24 hr capsule Commonly known as: Inderal LA Take 1 capsule (80 mg total) by mouth daily.   rosuvastatin 20 MG tablet Commonly known as: CRESTOR Take 1 tablet (20 mg total) by mouth daily.         Objective:   BP 100/64   Pulse (!) 54   Ht 5\' 11"  (1.803 m)   Wt 192 lb (87.1 kg)   SpO2 98%  BMI 26.78 kg/m   Wt Readings from Last 3 Encounters:  03/09/23 192 lb (87.1 kg)  01/27/23 205 lb 3.2 oz (93.1 kg)  12/24/22 202 lb (91.6 kg)    Physical Exam Vitals and nursing note reviewed.  Constitutional:      General: He is not in acute distress.    Appearance: He is well-developed. He is not diaphoretic.  Eyes:     General: No scleral icterus.    Conjunctiva/sclera: Conjunctivae normal.  Neck:     Thyroid: No thyromegaly.  Cardiovascular:     Rate and Rhythm: Normal rate and regular rhythm.     Heart sounds: Normal heart sounds. No murmur heard. Pulmonary:     Effort: Pulmonary effort is normal. No respiratory distress.     Breath sounds: Normal breath sounds. No wheezing.  Musculoskeletal:         General: No swelling. Normal range of motion.     Cervical back: Neck supple.  Lymphadenopathy:     Cervical: No cervical adenopathy.  Skin:    General: Skin is warm and dry.     Findings: No rash.  Neurological:     Mental Status: He is alert and oriented to person, place, and time.     Coordination: Coordination normal.  Psychiatric:        Behavior: Behavior normal.       Assessment & Plan:   Problem List Items Addressed This Visit       Cardiovascular and Mediastinum   HTN (hypertension)   Relevant Orders   CBC with Differential/Platelet   CMP14+EGFR   Paroxysmal A-fib (HCC)   Relevant Orders   CBC with Differential/Platelet     Other   HLD (hyperlipidemia) - Primary   Relevant Orders   Lipid panel    Continue current medicine, will do blood work today.  Continue to work with cardiology exertional fatigue. Follow up plan: Return in about 6 months (around 09/08/2023), or if symptoms worsen or fail to improve, for Hypertension hyperlipidemia and A-fib.  Counseling provided for all of the vaccine components Orders Placed This Encounter  Procedures   CBC with Differential/Platelet   CMP14+EGFR   Lipid panel    Arville Care, MD [facility] 03/09/2023, 3:02 PM

## 2023-03-10 LAB — CBC WITH DIFFERENTIAL/PLATELET
Basophils Absolute: 0.1 10*3/uL (ref 0.0–0.2)
Basos: 1 %
EOS (ABSOLUTE): 0.2 10*3/uL (ref 0.0–0.4)
Eos: 2 %
Hematocrit: 41.8 % (ref 37.5–51.0)
Hemoglobin: 14.4 g/dL (ref 13.0–17.7)
Immature Grans (Abs): 0 10*3/uL (ref 0.0–0.1)
Immature Granulocytes: 0 %
Lymphocytes Absolute: 4 10*3/uL — ABNORMAL HIGH (ref 0.7–3.1)
Lymphs: 34 %
MCH: 32.6 pg (ref 26.6–33.0)
MCHC: 34.4 g/dL (ref 31.5–35.7)
MCV: 95 fL (ref 79–97)
Monocytes Absolute: 1 10*3/uL — ABNORMAL HIGH (ref 0.1–0.9)
Monocytes: 9 %
Neutrophils Absolute: 6.5 10*3/uL (ref 1.4–7.0)
Neutrophils: 54 %
Platelets: 224 10*3/uL (ref 150–450)
RBC: 4.42 x10E6/uL (ref 4.14–5.80)
RDW: 12.7 % (ref 11.6–15.4)
WBC: 12 10*3/uL — ABNORMAL HIGH (ref 3.4–10.8)

## 2023-03-10 LAB — CMP14+EGFR
ALT: 29 IU/L (ref 0–44)
AST: 28 IU/L (ref 0–40)
Albumin: 5 g/dL — ABNORMAL HIGH (ref 3.9–4.9)
Alkaline Phosphatase: 76 IU/L (ref 44–121)
BUN/Creatinine Ratio: 15 (ref 10–24)
BUN: 32 mg/dL — ABNORMAL HIGH (ref 8–27)
Bilirubin Total: 0.8 mg/dL (ref 0.0–1.2)
CO2: 23 mmol/L (ref 20–29)
Calcium: 10 mg/dL (ref 8.6–10.2)
Chloride: 100 mmol/L (ref 96–106)
Creatinine, Ser: 2.16 mg/dL — ABNORMAL HIGH (ref 0.76–1.27)
Globulin, Total: 2.5 g/dL (ref 1.5–4.5)
Glucose: 114 mg/dL — ABNORMAL HIGH (ref 70–99)
Potassium: 4.8 mmol/L (ref 3.5–5.2)
Sodium: 140 mmol/L (ref 134–144)
Total Protein: 7.5 g/dL (ref 6.0–8.5)
eGFR: 33 mL/min/{1.73_m2} — ABNORMAL LOW (ref >59)

## 2023-03-10 LAB — LIPID PANEL
Chol/HDL Ratio: 2.4 ratio (ref 0.0–5.0)
Cholesterol, Total: 148 mg/dL (ref 100–199)
HDL: 61 mg/dL (ref >39)
LDL Chol Calc (NIH): 65 mg/dL (ref 0–99)
Triglycerides: 129 mg/dL (ref 0–149)
VLDL Cholesterol Cal: 22 mg/dL (ref 5–40)

## 2023-03-23 ENCOUNTER — Other Ambulatory Visit: Payer: Self-pay

## 2023-03-23 ENCOUNTER — Other Ambulatory Visit: Payer: PRIVATE HEALTH INSURANCE

## 2023-03-23 DIAGNOSIS — N289 Disorder of kidney and ureter, unspecified: Secondary | ICD-10-CM

## 2023-03-24 LAB — BMP8+EGFR
BUN/Creatinine Ratio: 18 (ref 10–24)
BUN: 31 mg/dL — ABNORMAL HIGH (ref 8–27)
CO2: 23 mmol/L (ref 20–29)
Calcium: 8.8 mg/dL (ref 8.6–10.2)
Chloride: 104 mmol/L (ref 96–106)
Creatinine, Ser: 1.77 mg/dL — ABNORMAL HIGH (ref 0.76–1.27)
Glucose: 97 mg/dL (ref 70–99)
Potassium: 5.2 mmol/L (ref 3.5–5.2)
Sodium: 141 mmol/L (ref 134–144)
eGFR: 42 mL/min/{1.73_m2} — ABNORMAL LOW (ref >59)

## 2023-03-25 ENCOUNTER — Other Ambulatory Visit (HOSPITAL_COMMUNITY): Payer: Self-pay | Admitting: Physician Assistant

## 2023-03-25 ENCOUNTER — Other Ambulatory Visit: Payer: Self-pay

## 2023-03-25 DIAGNOSIS — N289 Disorder of kidney and ureter, unspecified: Secondary | ICD-10-CM

## 2023-03-25 DIAGNOSIS — G250 Essential tremor: Secondary | ICD-10-CM

## 2023-03-26 ENCOUNTER — Ambulatory Visit (HOSPITAL_COMMUNITY): Payer: PRIVATE HEALTH INSURANCE | Admitting: Physician Assistant

## 2023-04-03 ENCOUNTER — Ambulatory Visit: Payer: PRIVATE HEALTH INSURANCE | Admitting: Family Medicine

## 2023-04-03 ENCOUNTER — Encounter: Payer: Self-pay | Admitting: Family Medicine

## 2023-04-03 ENCOUNTER — Ambulatory Visit (INDEPENDENT_AMBULATORY_CARE_PROVIDER_SITE_OTHER): Payer: PRIVATE HEALTH INSURANCE

## 2023-04-03 VITALS — BP 126/68 | HR 46 | Temp 98.6°F | Ht 71.0 in | Wt 200.8 lb

## 2023-04-03 DIAGNOSIS — R0989 Other specified symptoms and signs involving the circulatory and respiratory systems: Secondary | ICD-10-CM | POA: Diagnosis not present

## 2023-04-03 DIAGNOSIS — R0602 Shortness of breath: Secondary | ICD-10-CM

## 2023-04-03 DIAGNOSIS — R051 Acute cough: Secondary | ICD-10-CM

## 2023-04-03 MED ORDER — ALBUTEROL SULFATE HFA 108 (90 BASE) MCG/ACT IN AERS: 2.0000 | INHALATION_SPRAY | Freq: Four times a day (QID) | RESPIRATORY_TRACT | 2 refills | Status: DC | PRN

## 2023-04-03 MED ORDER — DOXYCYCLINE HYCLATE 100 MG PO TABS
100.0000 mg | ORAL_TABLET | Freq: Two times a day (BID) | ORAL | 0 refills | Status: AC
Start: 2023-04-03 — End: 2023-04-10

## 2023-04-03 NOTE — Progress Notes (Signed)
Acute Office Visit  Subjective:     Patient ID: Louis [name], male    DOB: [birth year], 67 y.o.   MRN: [id number]  Chief Complaint  Patient presents with   chest congestion    Has had chest congestion for about 2 weeks. Had runny nose before but has resolved.    Cough This is a new problem. Episode onset: 2 weeks. The problem has been unchanged. The problem occurs hourly. The cough is Productive of sputum. Associated symptoms include chest pain (burning in center after cough), nasal congestion, rhinorrhea and shortness of breath (reprots his baseline). Pertinent negatives include no chills, ear congestion, ear pain, fever, headaches, heartburn, hemoptysis, myalgias, postnasal drip, sore throat, sweats or wheezing. Nothing aggravates the symptoms. Treatments tried: tylenol. His past medical history is significant for bronchitis, emphysema and pneumonia. There is no history of asthma.    Review of Systems  Constitutional:  Negative for chills and fever.  HENT:  Positive for rhinorrhea. Negative for ear pain, postnasal drip and sore throat.   Respiratory:  Positive for cough and shortness of breath (reprots his baseline). Negative for hemoptysis and wheezing.   Cardiovascular:  Positive for chest pain (burning in center after cough).  Gastrointestinal:  Negative for heartburn.  Musculoskeletal:  Negative for myalgias.  Neurological:  Negative for headaches.        Objective:    BP 126/68   Pulse (!) 46   Temp 98.6 F (37 C) (Temporal)   Ht 5\' 11"  (1.803 m)   Wt 200 lb 12.8 oz (91.1 kg)   SpO2 90%   BMI 28.01 kg/m    Pulse Readings from Last 3 Encounters:  04/03/23 (!) 46  03/09/23 (!) 54  02/05/23 (!) 48    Physical Exam Vitals and nursing note reviewed.  Constitutional:      General: He is not in acute distress.    Appearance: He is not ill-appearing, toxic-appearing or diaphoretic.  HENT:     Right Ear: Tympanic membrane, ear canal and external ear normal.      Left Ear: Tympanic membrane, ear canal and external ear normal.     Nose: Nose normal.     Mouth/Throat:     Mouth: Mucous membranes are moist.     Pharynx: Oropharynx is clear.  Cardiovascular:     Rate and Rhythm: Normal rate and regular rhythm.     Heart sounds: Normal heart sounds. No murmur heard. Pulmonary:     Effort: Pulmonary effort is normal. No respiratory distress.     Breath sounds: Examination of the right-middle field reveals rhonchi. Examination of the left-middle field reveals rhonchi. Examination of the right-lower field reveals rhonchi. Examination of the left-lower field reveals rhonchi. Rhonchi present. No wheezing or rales.  Abdominal:     General: Bowel sounds are normal. There is no distension.     Palpations: Abdomen is soft.     Tenderness: There is no abdominal tenderness.  Musculoskeletal:     Cervical back: Neck supple. No rigidity.     Right lower leg: No edema.     Left lower leg: No edema.  Lymphadenopathy:     Cervical: No cervical adenopathy.  Skin:    General: Skin is warm and dry.  Neurological:     General: No focal deficit present.     Mental Status: He is alert.  Psychiatric:        Mood and Affect: Mood normal.        Behavior: Behavior  normal.     No results found for any visits on 04/03/23.      Assessment & Plan:   Ramy was seen today for chest congestion.  Diagnoses and all orders for this visit:  Chest congestion -     DG Chest 2 View -     doxycycline (VIBRA-TABS) 100 MG tablet; Take 1 tablet (100 mg total) by mouth 2 (two) times daily for 7 days.  Shortness of breath -     albuterol (VENTOLIN HFA) 108 (90 Base) MCG/ACT inhaler; Inhale 2 puffs into the lungs every 6 (six) hours as needed for wheezing or shortness of breath.  CXR with no signs of pneumonia today, agree with radiology read. Will treat with doxycyline as is his a smoker. Albuterol prn for shortness of breath. Discussed symptomatic care and return  precautions.   Return if symptoms worsen or fail to improve.  The patient indicates understanding of these issues and agrees with the plan.  Gabriel Earing, FNP

## 2023-04-13 ENCOUNTER — Ambulatory Visit: Payer: PRIVATE HEALTH INSURANCE | Attending: Cardiology

## 2023-04-13 DIAGNOSIS — Z79899 Other long term (current) drug therapy: Secondary | ICD-10-CM

## 2023-04-13 DIAGNOSIS — R931 Abnormal findings on diagnostic imaging of heart and coronary circulation: Secondary | ICD-10-CM

## 2023-04-13 DIAGNOSIS — I2510 Atherosclerotic heart disease of native coronary artery without angina pectoris: Secondary | ICD-10-CM

## 2023-08-22 ENCOUNTER — Other Ambulatory Visit: Payer: Self-pay | Admitting: Family Medicine

## 2023-08-22 DIAGNOSIS — I10 Essential (primary) hypertension: Secondary | ICD-10-CM

## 2023-09-07 ENCOUNTER — Telehealth: Payer: PRIVATE HEALTH INSURANCE | Admitting: Family Medicine

## 2023-09-07 DIAGNOSIS — R051 Acute cough: Secondary | ICD-10-CM | POA: Diagnosis not present

## 2023-09-07 DIAGNOSIS — I480 Paroxysmal atrial fibrillation: Secondary | ICD-10-CM | POA: Diagnosis not present

## 2023-09-07 DIAGNOSIS — I10 Essential (primary) hypertension: Secondary | ICD-10-CM | POA: Diagnosis not present

## 2023-09-07 NOTE — Progress Notes (Signed)
   Virtual Visit via video Note   Due to COVID-19 pandemic this visit was conducted virtually. This visit type was conducted due to national recommendations for restrictions regarding the COVID-19 Pandemic (e.g. social distancing, sheltering in place) in an effort to limit this patient's exposure and mitigate transmission in our community. All issues noted in this document were discussed and addressed.  A physical exam was not performed with this format.  I connected with  Louis [name]  on 09/07/23 at 1553 by video and verified that I am speaking with the correct person using two identifiers. Louis [name] is currently located at home and his wife is currently with him during visit. The provider, Gabriel Earing, FNP is located in their office at time of visit.  I discussed the limitations, risks, security and privacy concerns of performing an evaluation and management service by video  and the availability of in person appointments. I also discussed with the patient that there may be a patient responsible charge related to this service. The patient expressed understanding and agreed to proceed.  CC: Cough  History and Present Illness:  Louis [name] reports a cough that started yesterday. Productive with green phlegm. Denies fever, shortness of breath, wheezing, chest pain. He has had a runny nose and a mild HA. Denies chronic lung hx. Hx of A. Fib. No sick contacts. He did take a nap this afternoon and feels better since.    ROS As per HPI.     Observations/Objective: Alert and oriented. Respirations unlabored. No cyanosis. Non toxic appearing. Normal mood and behavior.   Assessment and Plan: Louis [name] was seen today for cough.  Diagnoses and all orders for this visit:  Acute cough Patient will come for testing as below. Quarantine until results. Discussed antiviral treatment if Covid positive due to chronic medical hx as below. Discussed symptomatic care and return precautions.  -      COVID-19, Flu A+B and RSV  HTN A. Fib   Follow Up Instructions: As needed.     I discussed the assessment and treatment plan with the patient. The patient was provided an opportunity to ask questions and all were answered. The patient agreed with the plan and demonstrated an understanding of the instructions.   The patient was advised to call back or seek an in-person evaluation if the symptoms worsen or if the condition fails to improve as anticipated.  The above assessment and management plan was discussed with the patient. The patient verbalized understanding of and has agreed to the management plan. Patient is aware to call the clinic if symptoms persist or worsen. Patient is aware when to return to the clinic for a follow-up visit. Patient educated on when it is appropriate to go to the emergency department.   Time call ended: 1559  I provided 6 minutes of face-to-face time during this encounter.    Gabriel Earing, FNP

## 2023-09-08 LAB — COVID-19, FLU A+B AND RSV
Influenza A, NAA: NOT DETECTED
Influenza B, NAA: NOT DETECTED
RSV, NAA: NOT DETECTED
SARS-CoV-2, NAA: NOT DETECTED

## 2023-09-19 ENCOUNTER — Other Ambulatory Visit: Payer: Self-pay | Admitting: Family Medicine

## 2023-09-19 DIAGNOSIS — I10 Essential (primary) hypertension: Secondary | ICD-10-CM

## 2023-10-18 ENCOUNTER — Other Ambulatory Visit (HOSPITAL_COMMUNITY): Payer: Self-pay | Admitting: Cardiology

## 2023-10-18 DIAGNOSIS — G250 Essential tremor: Secondary | ICD-10-CM

## 2023-11-03 ENCOUNTER — Other Ambulatory Visit: Payer: Self-pay | Admitting: Family Medicine

## 2023-11-03 DIAGNOSIS — I10 Essential (primary) hypertension: Secondary | ICD-10-CM

## 2024-01-06 ENCOUNTER — Encounter: Payer: Self-pay | Admitting: Physician Assistant

## 2024-01-06 ENCOUNTER — Ambulatory Visit: Payer: PRIVATE HEALTH INSURANCE | Attending: Physician Assistant | Admitting: Physician Assistant

## 2024-01-06 VITALS — BP 116/80 | HR 79 | Ht 71.0 in | Wt 205.0 lb

## 2024-01-06 DIAGNOSIS — I4892 Unspecified atrial flutter: Secondary | ICD-10-CM | POA: Diagnosis not present

## 2024-01-06 DIAGNOSIS — I4891 Unspecified atrial fibrillation: Secondary | ICD-10-CM | POA: Diagnosis not present

## 2024-01-06 DIAGNOSIS — E7800 Pure hypercholesterolemia, unspecified: Secondary | ICD-10-CM | POA: Diagnosis not present

## 2024-01-06 DIAGNOSIS — I2510 Atherosclerotic heart disease of native coronary artery without angina pectoris: Secondary | ICD-10-CM

## 2024-01-06 DIAGNOSIS — I10 Essential (primary) hypertension: Secondary | ICD-10-CM

## 2024-01-06 MED ORDER — APIXABAN 5 MG PO TABS: 5.0000 mg | ORAL_TABLET | Freq: Two times a day (BID) | ORAL | 11 refills | Status: AC

## 2024-01-06 NOTE — Assessment & Plan Note (Signed)
 LDL was optimal at 41 in July 2024.   - Continue Crestor  20 mg daily.

## 2024-01-06 NOTE — Progress Notes (Signed)
 Cardiology Office Note:    Date:  01/06/2024  ID:  Louis [name], DOB 08/01/1956, MRN [id number] PCP: Dettinger, Lucio Sabin, MD   [facility] Providers Cardiologist:  Dorothye Gathers, MD       Patient Profile:      Paroxysmal atrial fibrillation TTE 12/02/2022: EF 60-65, no RWMA, normal RVSF Monitor 12/2022: 3% atrial fibrillation burden Coronary artery disease CCTA 02/05/2023: CAC score 1918 (97th percentile), TPV extensive (1479 mm, 93rd percentile; calcified 446 mm, noncalcified 1033 mm); RCA proximal 25-49 (FFR 0.98), mid 50-69 (FFR 0.89), distal FFR 0.88; LM distal 25-49 (FFR 0.97); LAD proximal >70 (FFR 0.97), mid 50-69 (FFR 0.88), D2 FFR 0.75 (region of concern is an area of slab reconstruction artifact-small vessel); LCx diffuse 25-49 (FFR 0.94 proximal, 0.78 mid), OM 2 50-69 (FFR 0.9) FFR findings consistent with significant stenosis in D2, intermediate significance in mid LCx.  Both areas in the region of associated slab reconstruction artifact.  Vessels are small and not amenable to PCI. Hypertension Hyperlipidemia Aortic atherosclerosis Tobacco use Alcohol use        Discussed the use of AI scribe software for clinical note transcription with the patient, who gave verbal consent to proceed.  History of Present Illness Louis [name] is a 68 y.o. male who returns for follow-up of CAD, atrial fibrillation.  He was evaluated by Dr. Renna Cary in May 2024.  He noted exertional shortness of breath.  Follow-up coronary CTA demonstrated diffuse CAD, calcium  score 1918 (97 percentile) and extensive total plaque volume.  FFR suggested significant stenosis in D2 and intermediate significant stenosis in mid LCx.  However these were areas of associated slab reconstruction artifact.  The vessels were felt to be small and not amenable to PCI.  Aggressive risk factor management was recommended.  He is here alone. He has been experiencing shortness of breath for the past month, which worsens  with exertion, such as climbing stairs. Despite this, he is able to walk 4 to 6 miles a day at work. No chest pain, pressure, or tightness, and no syncope. He does not need to prop up on pillows to breathe at night and has no leg swelling. He takes Eliquis  once daily. He started doing this due to previous insurance costs. He quit smoking two years ago and consumes a couple of alcoholic drinks daily, preferring rum with grape juice. He denies drug use.   Review of Systems  Gastrointestinal:  Positive for hemorrhoids (occ bleeding). Negative for melena.  Genitourinary:  Negative for hematuria.  -See HPI    Studies Reviewed:   EKG Interpretation Date/Time:  Wednesday January 06 2024 13:59:37 EDT Ventricular Rate:  79 PR Interval:    QRS Duration:  98 QT Interval:  404 QTC Calculation: 463 R Axis:   78  Text Interpretation: Atrial flutter with variable A-V block Nonspecific ST abnormality Incomplete right bundle branch block Confirmed by [name] [phone]) on 01/06/2024 2:22:20 PM   Results LABS LDL: 41 (04/13/2023) ALT: 9 (07/2023)    Risk Assessment/Calculations:    CHA2DS2-VASc Score = 3   This indicates a 3.2% annual risk of stroke. The patient's score is based upon: CHF History: 0 HTN History: 1 Diabetes History: 0 Stroke History: 0 Vascular Disease History: 1 Age Score: 1 Gender Score: 0            Physical Exam:   VS:  BP 116/80   Pulse 79   Ht 5\' 11"  (1.803 m)   Wt 205 lb (93 kg)  SpO2 95%   BMI 28.59 kg/m    Wt Readings from Last 3 Encounters:  01/06/24 205 lb (93 kg)  04/03/23 200 lb 12.8 oz (91.1 kg)  03/09/23 192 lb (87.1 kg)    Constitutional:      Appearance: Healthy appearance. Not in distress.  Neck:     Vascular: JVD normal.  Pulmonary:     Breath sounds: Normal breath sounds. No wheezing. No rales.  Cardiovascular:     Normal rate. Irregularly irregular rhythm.     Murmurs: There is no murmur.  Edema:    Peripheral edema absent.   Abdominal:     Palpations: Abdomen is soft.        Assessment and Plan:   Assessment & Plan Atrial fibrillation/flutter [facility]) He is currently in atrial flutter.  Heart rate is controlled.  He is likely symptomatic, experiencing slight dyspnea on exertion. Echocardiogram in March 2024 showed normal ejection fraction.  He is currently taking Eliquis  once daily.  He started doing this due to cost and insurance issues in the past.  We discussed the need to take 3 weeks of uninterrupted anticoagulation prior to proceeding with cardioversion to restore NSR. - Increase Eliquis  to 5 mg twice daily. - Follow up in 3 weeks for repeat EKG, CBC, and BMET. - If still in atrial flutter, arrange cardioversion. - Continue Cardizem  CD 180 mg daily. - Continue Inderal  LA 80 mg daily. - Refer to electrophysiologist for consideration of PVI and CTI ablation. Coronary artery disease involving native coronary artery of native heart without angina pectoris Coronary CTA in May 2024 showed extensive calcification with a calcium  score of 1918, 97th percentile and extensive plaque volume.  There was diffuse CAD with significant stenosis in D2 and intermediate stenosis in mid LCX, not amenable to PCI.  He has not had chest discomfort to suggest angina.  I suspect his shortness of breath is related to atrial flutter.  However, if dyspnea persists despite restoration of normal sinus rhythm, myocardial perfusion imaging study may be considered to rule out significant ischemia.  He is not on antiplatelet therapy as he is on Eliquis . - Continue Crestor  20 mg daily. - Continue lisinopril  40 mg daily. - If dyspnea persists despite restoration of normal sinus rhythm, consider myocardial perfusion imaging study. Essential hypertension Blood pressure is well-controlled with current medication regimen. - Continue Cardizem  CD 180 mg daily. - Continue lisinopril  40 mg daily. - Continue Inderal  LA 80 mg daily. Pure  hypercholesterolemia LDL was optimal at 41 in July 2024.   - Continue Crestor  20 mg daily.      Dispo:  Return in about 4 weeks (around 02/03/2024) for Routine Follow Up, w/ [name], PA-C.  Signed, [name], PA-C

## 2024-01-06 NOTE — Assessment & Plan Note (Signed)
 He is currently in atrial flutter.  Heart rate is controlled.  He is likely symptomatic, experiencing slight dyspnea on exertion. Echocardiogram in March 2024 showed normal ejection fraction.  He is currently taking Eliquis  once daily.  He started doing this due to cost and insurance issues in the past.  We discussed the need to take 3 weeks of uninterrupted anticoagulation prior to proceeding with cardioversion to restore NSR. - Increase Eliquis  to 5 mg twice daily. - Follow up in 3 weeks for repeat EKG, CBC, and BMET. - If still in atrial flutter, arrange cardioversion. - Continue Cardizem  CD 180 mg daily. - Continue Inderal  LA 80 mg daily. - Refer to electrophysiologist for consideration of PVI and CTI ablation.

## 2024-01-06 NOTE — Patient Instructions (Signed)
 Medication Instructions:  Your physician has recommended you make the following change in your medication:  YOU NEED TO TAKE THE ELIQUIS  TWICE A DAY EVERYDAY  *If you need a refill on your cardiac medications before your next appointment, please call your pharmacy*  Lab Work: None ordered today  If you have labs (blood work) drawn today and your tests are completely normal, you will receive your results only by: MyChart Message (if you have MyChart) OR A paper copy in the mail If you have any lab test that is abnormal or we need to change your treatment, we will call you to review the results.  Testing/Procedures: None ordered  Follow-Up: At [facility], you and your health needs are our priority.  As part of our continuing mission to provide you with exceptional heart care, our providers are all part of one team.  This team includes your primary Cardiologist (physician) and Advanced Practice Providers or APPs (Physician Assistants and Nurse Practitioners) who all work together to provide you with the care you need, when you need it.  Your next appointment:   3 WEEKS  Provider:  [name]. PA-C   We recommend signing up for the patient portal called "MyChart".  Sign up information is provided on this After Visit Summary.  MyChart is used to connect with patients for Virtual Visits (Telemedicine).  Patients are able to view lab/test results, encounter notes, upcoming appointments, etc.  Non-urgent messages can be sent to your provider as well.   To learn more about what you can do with MyChart, go to [email or web address].   Other Instructions       1st Floor: - Lobby - Registration  - Pharmacy  - Lab - Cafe  2nd Floor: - PV Lab - Diagnostic Testing (echo, CT, nuclear med)  3rd Floor: - Vacant  4th Floor: - TCTS (cardiothoracic surgery) - AFib Clinic - Structural Heart Clinic - Vascular Surgery  - Vascular Ultrasound  5th Floor: - HeartCare  Cardiology (general and EP) - Clinical Pharmacy for coumadin, hypertension, lipid, weight-loss medications, and med management appointments    Valet parking services will be available as well.

## 2024-01-06 NOTE — Assessment & Plan Note (Signed)
 Coronary CTA in May 2024 showed extensive calcification with a calcium  score of 1918, 97th percentile and extensive plaque volume.  There was diffuse CAD with significant stenosis in D2 and intermediate stenosis in mid LCX, not amenable to PCI.  He has not had chest discomfort to suggest angina.  I suspect his shortness of breath is related to atrial flutter.  However, if dyspnea persists despite restoration of normal sinus rhythm, myocardial perfusion imaging study may be considered to rule out significant ischemia.  He is not on antiplatelet therapy as he is on Eliquis . - Continue Crestor  20 mg daily. - Continue lisinopril  40 mg daily. - If dyspnea persists despite restoration of normal sinus rhythm, consider myocardial perfusion imaging study.

## 2024-01-13 ENCOUNTER — Other Ambulatory Visit: Payer: Self-pay | Admitting: Family Medicine

## 2024-01-13 DIAGNOSIS — I10 Essential (primary) hypertension: Secondary | ICD-10-CM

## 2024-01-14 ENCOUNTER — Encounter: Payer: Self-pay | Admitting: Family Medicine

## 2024-01-14 NOTE — Telephone Encounter (Signed)
Lmtcb to schedule appt Letter mailed

## 2024-01-14 NOTE — Telephone Encounter (Signed)
 Dettinger NTBS in June for 6 mos FU RF sent to pharmacy

## 2024-01-27 ENCOUNTER — Other Ambulatory Visit: Payer: Self-pay

## 2024-01-27 ENCOUNTER — Encounter: Payer: Self-pay | Admitting: Cardiology

## 2024-01-27 ENCOUNTER — Ambulatory Visit: Attending: Cardiology | Admitting: Cardiology

## 2024-01-27 VITALS — BP 144/80 | HR 61 | Ht 71.0 in | Wt 210.6 lb

## 2024-01-27 DIAGNOSIS — I2510 Atherosclerotic heart disease of native coronary artery without angina pectoris: Secondary | ICD-10-CM | POA: Diagnosis not present

## 2024-01-27 DIAGNOSIS — I4892 Unspecified atrial flutter: Secondary | ICD-10-CM

## 2024-01-27 DIAGNOSIS — I10 Essential (primary) hypertension: Secondary | ICD-10-CM | POA: Diagnosis not present

## 2024-01-27 DIAGNOSIS — I4891 Unspecified atrial fibrillation: Secondary | ICD-10-CM

## 2024-01-27 NOTE — Progress Notes (Signed)
 Electrophysiology Office Note:    Date:  01/27/2024   ID:  [name], DOB 09-Sep-1956, MRN [id number]  [facility] Cardiologist:  Dorothye Gathers, MD  [facility] Electrophysiologist:  Boyce Byes, MD   Referring MD: Louis Joe, PA-C   Chief Complaint: Atrial fibrillation and flutter  History of Present Illness:    Mr. Louis [name] is a 68 year old man who I am seeing today for an evaluation of atrial fibrillation and flutter at the request of Louis [name].  The patient has a history of atrial fibrillation with a prior monitor in 2024 showing a 3% burden.  He also carries a diagnosis of coronary artery disease, hypertension, hyperlipidemia.  He has atrial flutter as well.  He has dyspnea on exertion.  He takes Eliquis  for stroke prophylaxis.  Today he reports fatigue and shortness of breath while in atrial fibrillation.  He has been taking his Eliquis  appropriately for 3 weeks.  He does not describe outright chest pain but describes more shortness of breath with exertion.      Their past medical, social and family history was reviewed.   ROS:   Please see the history of present illness.    All other systems reviewed and are negative.  EKGs/Labs/Other Studies Reviewed:    The following studies were reviewed today:  January 06, 2024 EKG shows typical appearing atrial flutter, ventricular rate 79 bpm  December 24, 2022 EKG shows sinus bradycardia, ventricular rate 49 bpm, incomplete right bundle branch block.  QTc 429 ms  December 01, 2022 EKG shows atrial fibrillation, incomplete right bundle branch block, ventricular rate 115 bpm      Physical Exam:    VS:  BP (!) 144/80   Pulse 61   Ht 5\' 11"  (1.803 m)   Wt 210 lb 9.6 oz (95.5 kg)   SpO2 94%   BMI 29.37 kg/m     Wt Readings from Last 3 Encounters:  01/27/24 210 lb 9.6 oz (95.5 kg)  01/06/24 205 lb (93 kg)  04/03/23 200 lb 12.8 oz (91.1 kg)     GEN: no distress CARD: Irregularly irregular, No MRG RESP: No  IWOB. CTAB.        ASSESSMENT AND PLAN:    1. Atrial fibrillation/flutter (HCC)   2. Coronary artery disease involving native coronary artery of native heart without angina pectoris   3. Essential hypertension     #Atrial fibrillation flutter Symptomatic On Eliquis  for stroke prophylaxis I discussed treatment options for the patient during today's clinic appointment.  I discussed the catheter ablation procedure in detail including the risks, recovery and likelihood of success.  He is interested in proceeding.  I would like to get the PET stress done before ablation to exclude significant coronary artery disease.  Discussed treatment options today for AF including antiarrhythmic drug therapy and ablation. Discussed risks, recovery and likelihood of success with each treatment strategy. Risk, benefits, and alternatives to EP study and ablation for afib were discussed. These risks include but are not limited to stroke, bleeding, vascular damage, tamponade, perforation, damage to the esophagus, lungs, phrenic nerve and other structures, pulmonary vein stenosis, worsening renal function, coronary vasospasm and death.  Discussed potential need for repeat ablation procedures and antiarrhythmic drugs after an initial ablation. The patient understands these risk and wishes to proceed.  We will therefore proceed with catheter ablation at the next available time.  Carto, ICE, anesthesia are requested for the procedure.  Will also obtain CT PV protocol prior to the  procedure to exclude LAA thrombus and further evaluate atrial anatomy.  Before the ablation, plan for cardioversion to see if this helps some of his symptoms.  I discussed the cardioversion procedure in detail including the risks and recovery and he would like to proceed.  #Coronary artery disease Previous CTA coronary had artifact and was not fully diagnostic.  Recommend PET stress to fully evaluate.    Signed, Leanora Prophet. [name], MD,  [facility], [facility] 01/27/2024 12:41 PM    Electrophysiology [facility]

## 2024-01-27 NOTE — Patient Instructions (Addendum)
 Medication Instructions:  Your physician recommends that you continue on your current medications as directed. Please refer to the Current Medication list given to you today.  *If you need a refill on your cardiac medications before your next appointment, please call your pharmacy*  Lab Work: TODAY: BMET and CBC  Testing/Procedures: PET/CT scan - please see instructions below   Cardioversion  Your physician has recommended that you have a Cardioversion (DCCV). Electrical Cardioversion uses a jolt of electricity to your heart either through paddles or wired patches attached to your chest. This is a controlled, usually prescheduled, procedure. Defibrillation is done under light anesthesia in the hospital, and you usually go home the day of the procedure. This is done to get your heart back into a normal rhythm. You are not awake for the procedure.  Please see the instruction sheet given to you today.  Ablation  Your physician has recommended that you have an ablation. Catheter ablation is a medical procedure used to treat some cardiac arrhythmias (irregular heartbeats). During catheter ablation, a long, thin, flexible tube is put into a blood vessel in your groin (upper thigh), or neck. This tube is called an ablation catheter. It is then guided to your heart through the blood vessel. Radio frequency waves destroy small areas of heart tissue where abnormal heartbeats may cause an arrhythmia to start.  You are scheduled for Atrial Fibrillation Ablation on Wednesday, August 6 with Dr. Harvie Liner.Please arrive at the Main Entrance A at [facility]: [address] [place], [place] [address] at 9:00 AM    Follow-Up: At [facility], you and your health needs are our priority.  As part of our continuing mission to provide you with exceptional heart care, our providers are all part of one team.  This team includes your primary Cardiologist (physician) and Advanced Practice Providers  or APPs (Physician Assistants and Nurse Practitioners) who all work together to provide you with the care you need, when you need it.  Your next appointment:   We will call you to schedule your follow up appointments       Please report to Radiology at the [facility] Main Entrance 30 minutes early for your test.  [address] [place], [place] [address]                         OR   Please report to Radiology at [facility] Main Entrance, medical mall, 30 mins prior to your test.  [address]  [place], [place]  How to Prepare for Your Cardiac PET/CT Stress Test:  Nothing to eat or drink, except water, 3 hours prior to arrival time.  NO caffeine/decaffeinated products, or chocolate 12 hours prior to arrival. (Please note decaffeinated beverages (teas/coffees) still contain caffeine).  If you have caffeine within 12 hours prior, the test will need to be rescheduled.  Medication instructions: Do not take erectile dysfunction medications for 72 hours prior to test (sildenafil, tadalafil) Do not take nitrates (isosorbide mononitrate, Ranexa) the day before or day of test Do not take tamsulosin the day before or morning of test Hold theophylline containing medications for 12 hours. Hold Dipyridamole 48 hours prior to the test.  Diabetic Preparation: If able to eat breakfast prior to 3 hour fasting, you may take all medications, including your insulin. Do not worry if you miss your breakfast dose of insulin - start at your next meal. If you  do not eat prior to 3 hour fast-Hold all diabetes (oral and insulin) medications. Patients who wear a continuous glucose monitor MUST remove the device prior to scanning.  You may take your remaining medications with water.  NO perfume, cologne or lotion on chest or abdomen area. FEMALES - Please avoid wearing dresses to this appointment.  Total time is 1 to 2 hours; you may want to bring reading material  for the waiting time.  IF YOU THINK YOU MAY BE PREGNANT, OR ARE NURSING PLEASE INFORM THE TECHNOLOGIST.  In preparation for your appointment, medication and supplies will be purchased.  Appointment availability is limited, so if you need to cancel or reschedule, please call the Radiology Department Scheduler at [phone] 24 hours in advance to avoid a cancellation fee of $100.00  What to Expect When you Arrive:  Once you arrive and check in for your appointment, you will be taken to a preparation room within the Radiology Department.  A technologist or Nurse will obtain your medical history, verify that you are correctly prepped for the exam, and explain the procedure.  Afterwards, an IV will be started in your arm and electrodes will be placed on your skin for EKG monitoring during the stress portion of the exam. Then you will be escorted to the PET/CT scanner.  There, staff will get you positioned on the scanner and obtain a blood pressure and EKG.  During the exam, you will continue to be connected to the EKG and blood pressure machines.  A small, safe amount of a radioactive tracer will be injected in your IV to obtain a series of pictures of your heart along with an injection of a stress agent.    After your Exam:  It is recommended that you eat a meal and drink a caffeinated beverage to counter act any effects of the stress agent.  Drink plenty of fluids for the remainder of the day and urinate frequently for the first couple of hours after the exam.  Your doctor will inform you of your test results within 7-10 business days.  For more information and frequently asked questions, please visit our website: [email or web address]  For questions about your test or how to prepare for your test, please call: Cardiac Imaging Nurse Navigators Office: [phone]

## 2024-01-27 NOTE — H&P (View-Only) (Signed)
 Electrophysiology Office Note:    Date:  01/27/2024   ID:  Louis [name], DOB 09-Sep-1956, MRN [id number]  [facility] Cardiologist:  Dorothye Gathers, MD  [facility] Electrophysiologist:  Louis Byes, MD   Referring MD: Louis Joe, PA-C   Chief Complaint: Atrial fibrillation and flutter  History of Present Illness:    Mr. Louis [name] is a 68 year old man who I am seeing today for an evaluation of atrial fibrillation and flutter at the request of Louis [name].  The patient has a history of atrial fibrillation with a prior monitor in 2024 showing a 3% burden.  He also carries a diagnosis of coronary artery disease, hypertension, hyperlipidemia.  He has atrial flutter as well.  He has dyspnea on exertion.  He takes Eliquis  for stroke prophylaxis.  Today he reports fatigue and shortness of breath while in atrial fibrillation.  He has been taking his Eliquis  appropriately for 3 weeks.  He does not describe outright chest pain but describes more shortness of breath with exertion.      Their past medical, social and family history was reviewed.   ROS:   Please see the history of present illness.    All other systems reviewed and are negative.  EKGs/Labs/Other Studies Reviewed:    The following studies were reviewed today:  January 06, 2024 EKG shows typical appearing atrial flutter, ventricular rate 79 bpm  December 24, 2022 EKG shows sinus bradycardia, ventricular rate 49 bpm, incomplete right bundle branch block.  QTc 429 ms  December 01, 2022 EKG shows atrial fibrillation, incomplete right bundle branch block, ventricular rate 115 bpm      Physical Exam:    VS:  BP (!) 144/80   Pulse 61   Ht 5\' 11"  (1.803 m)   Wt 210 lb 9.6 oz (95.5 kg)   SpO2 94%   BMI 29.37 kg/m     Wt Readings from Last 3 Encounters:  01/27/24 210 lb 9.6 oz (95.5 kg)  01/06/24 205 lb (93 kg)  04/03/23 200 lb 12.8 oz (91.1 kg)     GEN: no distress CARD: Irregularly irregular, No MRG RESP: No  IWOB. CTAB.        ASSESSMENT AND PLAN:    1. Atrial fibrillation/flutter (HCC)   2. Coronary artery disease involving native coronary artery of native heart without angina pectoris   3. Essential hypertension     #Atrial fibrillation flutter Symptomatic On Eliquis  for stroke prophylaxis I discussed treatment options for the patient during today's clinic appointment.  I discussed the catheter ablation procedure in detail including the risks, recovery and likelihood of success.  He is interested in proceeding.  I would like to get the PET stress done before ablation to exclude significant coronary artery disease.  Discussed treatment options today for AF including antiarrhythmic drug therapy and ablation. Discussed risks, recovery and likelihood of success with each treatment strategy. Risk, benefits, and alternatives to EP study and ablation for afib were discussed. These risks include but are not limited to stroke, bleeding, vascular damage, tamponade, perforation, damage to the esophagus, lungs, phrenic nerve and other structures, pulmonary vein stenosis, worsening renal function, coronary vasospasm and death.  Discussed potential need for repeat ablation procedures and antiarrhythmic drugs after an initial ablation. The patient understands these risk and wishes to proceed.  We will therefore proceed with catheter ablation at the next available time.  Carto, ICE, anesthesia are requested for the procedure.  Will also obtain CT PV protocol prior to the  procedure to exclude LAA thrombus and further evaluate atrial anatomy.  Before the ablation, plan for cardioversion to see if this helps some of his symptoms.  I discussed the cardioversion procedure in detail including the risks and recovery and he would like to proceed.  #Coronary artery disease Previous CTA coronary had artifact and was not fully diagnostic.  Recommend PET stress to fully evaluate.    Signed, Louis [name]. Louis Slimmer, MD,  [facility], [facility] 01/27/2024 12:41 PM    Electrophysiology [facility]

## 2024-01-28 ENCOUNTER — Ambulatory Visit: Payer: Self-pay

## 2024-01-28 LAB — CBC
Hematocrit: 43.1 % (ref 37.5–51.0)
Hemoglobin: 14.2 g/dL (ref 13.0–17.7)
MCH: 32.2 pg (ref 26.6–33.0)
MCHC: 32.9 g/dL (ref 31.5–35.7)
MCV: 98 fL — ABNORMAL HIGH (ref 79–97)
Platelets: 204 10*3/uL (ref 150–450)
RBC: 4.41 x10E6/uL (ref 4.14–5.80)
RDW: 13.3 % (ref 11.6–15.4)
WBC: 7.1 10*3/uL (ref 3.4–10.8)

## 2024-01-28 LAB — BASIC METABOLIC PANEL WITH GFR
BUN/Creatinine Ratio: 16 (ref 10–24)
BUN: 15 mg/dL (ref 8–27)
CO2: 26 mmol/L (ref 20–29)
Calcium: 9.6 mg/dL (ref 8.6–10.2)
Chloride: 105 mmol/L (ref 96–106)
Creatinine, Ser: 0.93 mg/dL (ref 0.76–1.27)
Glucose: 95 mg/dL (ref 70–99)
Potassium: 4.8 mmol/L (ref 3.5–5.2)
Sodium: 147 mmol/L — ABNORMAL HIGH (ref 134–144)
eGFR: 91 mL/min/{1.73_m2} (ref >59)

## 2024-01-28 NOTE — Progress Notes (Deleted)
 Cardiology Office Note:    Date:  01/28/2024  ID:  Louis [name], DOB Aug 20, 1956, MRN [id number] PCP: Dettinger, Lucio Sabin, MD  [facility] Providers Cardiologist:  Dorothye Gathers, MD Electrophysiologist:  Boyce Byes, MD { Click to update primary MD,subspecialty MD or APP then REFRESH:1}    {Click to Open Review  :1}   Patient Profile:       *** Paroxysmal atrial fibrillation TTE 12/02/2022: EF 60-65, no RWMA, normal RVSF Monitor 12/2022: 3% atrial fibrillation burden Coronary artery disease CCTA 02/05/2023: CAC score 1918 (97th percentile), TPV extensive (1479 mm, 93rd percentile; calcified 446 mm, noncalcified 1033 mm); RCA proximal 25-49 (FFR 0.98), mid 50-69 (FFR 0.89), distal FFR 0.88; LM distal 25-49 (FFR 0.97); LAD proximal >70 (FFR 0.97), mid 50-69 (FFR 0.88), D2 FFR 0.75 (region of concern is an area of slab reconstruction artifact-small vessel); LCx diffuse 25-49 (FFR 0.94 proximal, 0.78 mid), OM 2 50-69 (FFR 0.9) FFR findings consistent with significant stenosis in D2, intermediate significance in mid LCx.  Both areas in the region of associated slab reconstruction artifact.  Vessels are small and not amenable to PCI. Hypertension Hyperlipidemia Aortic atherosclerosis Tobacco use Alcohol use             Discussed the use of AI scribe software for clinical note transcription with the patient, who gave verbal consent to proceed.  History of Present Illness Louis [name] is a 68 y.o. male who returns for follow-up of atrial fibrillation/flutter, CAD.  He was last seen 01/06/2024.  He was in atrial flutter at that time and was felt to be symptomatic.  He was only taking Eliquis  once a day.  This was increased to twice a day.  In the interim he saw Dr. [name] for EP consultation.  Cardioversion has been arranged.  He was also set up for PET MPI.  Plan is to ultimately proceed with PVI ablation for atrial fibrillation.    ROS-See HPI***       Studies Reviewed:        *** Results    Risk Assessment/Calculations:   {Does this patient have ATRIAL FIBRILLATION?:[phone]}         Physical Exam:   VS:  There were no vitals taken for this visit.   Wt Readings from Last 3 Encounters:  01/27/24 210 lb 9.6 oz (95.5 kg)  01/06/24 205 lb (93 kg)  04/03/23 200 lb 12.8 oz (91.1 kg)    Physical Exam***     Assessment and Plan:  Assessment and Plan Assessment & Plan     { Atrial fibrillation/flutter [facility]) He is currently in atrial flutter.  Heart rate is controlled.  He is likely symptomatic, experiencing slight dyspnea on exertion. Echocardiogram in March 2024 showed normal ejection fraction.  He is currently taking Eliquis  once daily.  He started doing this due to cost and insurance issues in the past.  We discussed the need to take 3 weeks of uninterrupted anticoagulation prior to proceeding with cardioversion to restore NSR. - Increase Eliquis  to 5 mg twice daily. - Follow up in 3 weeks for repeat EKG, CBC, and BMET. - If still in atrial flutter, arrange cardioversion. - Continue Cardizem  CD 180 mg daily. - Continue Inderal  LA 80 mg daily. - Refer to electrophysiologist for consideration of PVI and CTI ablation. Coronary artery disease involving native coronary artery of native heart without angina pectoris Coronary CTA in May 2024 showed extensive calcification with a calcium  score of 1918, 97th percentile and extensive  plaque volume.  There was diffuse CAD with significant stenosis in D2 and intermediate stenosis in mid LCX, not amenable to PCI.  He has not had chest discomfort to suggest angina.  I suspect his shortness of breath is related to atrial flutter.  However, if dyspnea persists despite restoration of normal sinus rhythm, myocardial perfusion imaging study may be considered to rule out significant ischemia.  He is not on antiplatelet therapy as he is on Eliquis . - Continue Crestor  20 mg daily. - Continue lisinopril  40 mg daily. - If  dyspnea persists despite restoration of normal sinus rhythm, consider myocardial perfusion imaging study. Essential hypertension Blood pressure is well-controlled with current medication regimen. - Continue Cardizem  CD 180 mg daily. - Continue lisinopril  40 mg daily. - Continue Inderal  LA 80 mg daily. Pure hypercholesterolemia LDL was optimal at 41 in July 2024.   - Continue Crestor  20 mg daily.     :1}    {Are you ordering a CV Procedure (e.g. stress test, cath, DCCV, TEE, etc)?   Press F2        :[id number]}  Dispo:  No follow-ups on file.  Signed, [name], PA-C

## 2024-01-29 ENCOUNTER — Ambulatory Visit: Payer: PRIVATE HEALTH INSURANCE | Admitting: Physician Assistant

## 2024-01-29 ENCOUNTER — Other Ambulatory Visit: Payer: Self-pay | Admitting: Cardiology

## 2024-01-29 DIAGNOSIS — I480 Paroxysmal atrial fibrillation: Secondary | ICD-10-CM

## 2024-01-29 DIAGNOSIS — I2510 Atherosclerotic heart disease of native coronary artery without angina pectoris: Secondary | ICD-10-CM

## 2024-02-05 NOTE — Progress Notes (Signed)
 Called patient with pre-procedure instructions for Tuesday Feb 09, 2024. Detailed voicemail left for patient.   Patient informed of:   Time to arrive for procedure. 0830 Remain NPO past midnight.  Must have a ride home and a responsible adult to remain with them for 24 hours post procedure.  Confirmed blood thinner. Eliquis 

## 2024-02-09 ENCOUNTER — Ambulatory Visit (HOSPITAL_COMMUNITY)

## 2024-02-09 ENCOUNTER — Other Ambulatory Visit: Payer: Self-pay

## 2024-02-09 ENCOUNTER — Encounter (HOSPITAL_COMMUNITY): Payer: Self-pay | Admitting: Cardiology

## 2024-02-09 ENCOUNTER — Encounter (HOSPITAL_COMMUNITY)
Admission: RE | Disposition: A | Discharge status: Home / Self Care | Payer: Self-pay | Source: Home / Self Care | Attending: Cardiology

## 2024-02-09 ENCOUNTER — Ambulatory Visit (HOSPITAL_COMMUNITY)
Admission: RE | Admit: 2024-02-09 | Discharge: 2024-02-09 | Disposition: A | Discharge status: Home / Self Care | Attending: Cardiology | Admitting: Cardiology

## 2024-02-09 DIAGNOSIS — Z87891 Personal history of nicotine dependence: Secondary | ICD-10-CM | POA: Diagnosis not present

## 2024-02-09 DIAGNOSIS — F419 Anxiety disorder, unspecified: Secondary | ICD-10-CM

## 2024-02-09 DIAGNOSIS — I10 Essential (primary) hypertension: Secondary | ICD-10-CM

## 2024-02-09 DIAGNOSIS — Z7901 Long term (current) use of anticoagulants: Secondary | ICD-10-CM | POA: Diagnosis not present

## 2024-02-09 DIAGNOSIS — I4819 Other persistent atrial fibrillation: Secondary | ICD-10-CM | POA: Diagnosis present

## 2024-02-09 DIAGNOSIS — G250 Essential tremor: Secondary | ICD-10-CM

## 2024-02-09 DIAGNOSIS — R0609 Other forms of dyspnea: Secondary | ICD-10-CM | POA: Diagnosis not present

## 2024-02-09 DIAGNOSIS — I4891 Unspecified atrial fibrillation: Secondary | ICD-10-CM

## 2024-02-09 DIAGNOSIS — I2510 Atherosclerotic heart disease of native coronary artery without angina pectoris: Secondary | ICD-10-CM | POA: Insufficient documentation

## 2024-02-09 DIAGNOSIS — I4892 Unspecified atrial flutter: Secondary | ICD-10-CM | POA: Diagnosis not present

## 2024-02-09 DIAGNOSIS — I483 Typical atrial flutter: Secondary | ICD-10-CM

## 2024-02-09 DIAGNOSIS — Z79899 Other long term (current) drug therapy: Secondary | ICD-10-CM | POA: Insufficient documentation

## 2024-02-09 DIAGNOSIS — Z01818 Encounter for other preprocedural examination: Secondary | ICD-10-CM

## 2024-02-09 DIAGNOSIS — E785 Hyperlipidemia, unspecified: Secondary | ICD-10-CM | POA: Diagnosis not present

## 2024-02-09 HISTORY — PX: CARDIOVERSION: EP1203

## 2024-02-09 SURGERY — CARDIOVERSION (CATH LAB)
Anesthesia: General

## 2024-02-09 MED ORDER — DILTIAZEM HCL ER COATED BEADS 180 MG PO CP24: 180.0000 mg | ORAL_CAPSULE | Freq: Every day | ORAL | Status: DC

## 2024-02-09 MED ORDER — PROPOFOL 10 MG/ML IV BOLUS
INTRAVENOUS | Status: DC | PRN
  Administered 2024-02-09: 90 mg via INTRAVENOUS

## 2024-02-09 MED ORDER — LIDOCAINE 2% (20 MG/ML) 5 ML SYRINGE
INTRAMUSCULAR | Status: DC | PRN
  Administered 2024-02-09: 100 mg via INTRAVENOUS

## 2024-02-09 MED ORDER — PROPRANOLOL HCL ER 80 MG PO CP24
80.0000 mg | ORAL_CAPSULE | Freq: Every day | ORAL | Status: DC
Start: 2024-02-09 — End: 2024-04-25

## 2024-02-09 MED ORDER — SODIUM CHLORIDE 0.9 % IV SOLN: INTRAVENOUS | Status: DC

## 2024-02-09 MED ORDER — EPHEDRINE SULFATE (PRESSORS) 50 MG/ML IJ SOLN
INTRAMUSCULAR | Status: DC | PRN
  Administered 2024-02-09: 5 mg via INTRAVENOUS

## 2024-02-09 SURGICAL SUPPLY — 1 items: PAD DEFIB RADIO PHYSIO CONN (PAD) ×1 IMPLANT

## 2024-02-09 NOTE — CV Procedure (Signed)
   DIRECT CURRENT CARDIOVERSION  NAME:  Louis [name]    MRN: [id number] DOB:  Sep 15, 1956    ADMIT DATE: 02/09/2024  Indication:  Symptomatic atrial flutter  Procedure Note:  The patient signed informed consent.  They have had had therapeutic anticoagulation with Eliquis  greater than 3 weeks.  Anesthesia was administered by Dr. Annabell Key.  Adequate airway was maintained throughout and vital followed per protocol.  They were cardioverted x 1 with 200J of biphasic synchronized energy.  They converted to Sinus Bradycardia.  There were no apparent complications.  The patient had normal neuro status and respiratory status post procedure with vitals stable as recorded elsewhere.    Placed holding parameters on both diltiazem  and propanolol.  Cindy updated - all questions answered.   Follow up: They will continue on current medical therapy and follow up with cardiology as scheduled.  Awilda Bogus, [facility]  A [facility] of [facility] [address], [place], [place] [address]  [place], [place] [address] 02/09/24

## 2024-02-09 NOTE — Transfer of Care (Signed)
 Immediate Anesthesia Transfer of Care Note  Patient: Louis [name]  Procedure(s) Performed: CARDIOVERSION  Patient Location: Cath Lab  Anesthesia Type:General  Level of Consciousness: drowsy  Airway & Oxygen Therapy: Patient Spontanous Breathing and Patient connected to nasal cannula oxygen  Post-op Assessment: Report given to RN and Post -op Vital signs reviewed and stable  Post vital signs: Reviewed and stable  Last Vitals:  Vitals Value Taken Time  BP 102/67   Temp    Pulse 46   Resp 12   SpO2 92     Last Pain:  Vitals:   02/09/24 0853  TempSrc:   PainSc: 0-No pain         Complications: No notable events documented.

## 2024-02-09 NOTE — Discharge Instructions (Signed)

## 2024-02-09 NOTE — Anesthesia Preprocedure Evaluation (Signed)
 Anesthesia Evaluation  Patient identified by MRN, date of birth, ID band Patient awake    Reviewed: Allergy & Precautions, H&P , NPO status , Patient's Chart, lab work & pertinent test results  Airway Mallampati: II  TM Distance: >3 FB Neck ROM: Full    Dental no notable dental hx. (+) Teeth Intact, Dental Advisory Given   Pulmonary former smoker   Pulmonary exam normal breath sounds clear to auscultation       Cardiovascular hypertension, Pt. on medications + dysrhythmias Atrial Fibrillation  Rhythm:Regular Rate:Normal     Neuro/Psych   Anxiety     negative neurological ROS     GI/Hepatic negative GI ROS, Neg liver ROS,,,  Endo/Other  negative endocrine ROS    Renal/GU negative Renal ROS  negative genitourinary   Musculoskeletal  (+) Arthritis , Osteoarthritis,    Abdominal   Peds  Hematology negative hematology ROS (+)   Anesthesia Other Findings   Reproductive/Obstetrics negative OB ROS                             Anesthesia Physical Anesthesia Plan  ASA: 3  Anesthesia Plan: General   Post-op Pain Management: Minimal or no pain anticipated   Induction: Intravenous  PONV Risk Score and Plan: 2 and Propofol  infusion and Treatment may vary due to age or medical condition  Airway Management Planned: Mask  Additional Equipment:   Intra-op Plan:   Post-operative Plan:   Informed Consent: I have reviewed the patients History and Physical, chart, labs and discussed the procedure including the risks, benefits and alternatives for the proposed anesthesia with the patient or authorized representative who has indicated his/her understanding and acceptance.     Dental advisory given  Plan Discussed with: CRNA  Anesthesia Plan Comments: (PAT note by Rudy Costain, PA-C: 68 year old male with medical history significant for dyslipidemia, hypertension, ongoing tobacco abuse, and  alcohol abuse.  Patient was in the hospital on 01/29/2022 and discharged on 01/30/2022 for community-acquired pneumonia. He was also found to be in A-fib with RVR. This was new onset A-fib.  He initially converted in the ED with Cardizem  drip.  He had another episode of A-fib with RVR on day of discharge 01/30/2022 and again converted with IV Cardizem .  He was not started on anticoagulation.  Echocardiogram 01/29/2022 showed EF 55 to 60%, grade 1 DD no significant valvular abnormalities.  He was supposed to follow-up with cardiology after discharge but has not done so.  He has followed up on multiple occasions with his PCP Dr. Steen Eden and has been continued on Cardizem .  He was last seen 09/04/2022 and denied any cardiovascular complaints.  Heart rate at that time was 68 and blood pressure 138/88.  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  He was referred urgently to ENT for further evaluation management.  Pt will need DOS labs and eval.  EKG 01/29/22: Sinus rhythm. Rate 63. Atrial premature complexes. Consider right ventricular hypertrophy  TTE 01/29/22: 1. Left ventricular ejection fraction, by estimation, is 55 to 60%. The  left ventricle has normal function. The left ventricle has no regional  wall motion abnormalities. Left ventricular diastolic parameters are  consistent with Grade I diastolic  dysfunction (impaired relaxation).  2. Right ventricular systolic function is normal. The right ventricular  size is normal. Tricuspid regurgitation  signal is inadequate for assessing  PA pressure.  3. The mitral valve is normal in structure. No evidence of mitral valve  regurgitation. No evidence of mitral stenosis.  4. The aortic valve has an indeterminant number of cusps. Aortic valve  regurgitation is not visualized. No aortic stenosis is present.  5. The inferior vena cava is normal in  size with greater than 50%  respiratory variability, suggesting right atrial pressure of 3 mmHg.   )        Anesthesia Quick Evaluation

## 2024-02-09 NOTE — Anesthesia Postprocedure Evaluation (Signed)
 Anesthesia Post Note  Patient: Lisandro Meggett  Procedure(s) Performed: CARDIOVERSION     Patient location during evaluation: PACU Anesthesia Type: General Level of consciousness: awake and alert Pain management: pain level controlled Vital Signs Assessment: post-procedure vital signs reviewed and stable Respiratory status: spontaneous breathing, nonlabored ventilation and respiratory function stable Cardiovascular status: blood pressure returned to baseline and stable Postop Assessment: no apparent nausea or vomiting Anesthetic complications: no   No notable events documented.  Last Vitals:  Vitals:   02/09/24 1010 02/09/24 1012  BP: 106/76 115/78  Pulse: (!) 49 (!) 49  Resp: (!) 21 20  Temp: 36.6 C   SpO2: 96% 97%    Last Pain:  Vitals:   02/09/24 1010  TempSrc: Temporal  PainSc: 0-No pain                 Earvin Goldberg

## 2024-02-09 NOTE — Interval H&P Note (Signed)
 History and Physical Interval Note:  02/09/2024 9:17 AM  Louis [name]  has presented today for surgery, with the diagnosis of AFIB.  The various methods of treatment have been discussed with the patient and family. After consideration of risks, benefits and other options for treatment, the patient has consented to  Procedure(s): CARDIOVERSION (N/A) as a surgical intervention.  The patient's history has been reviewed, patient examined, no change in status, stable for surgery.  I have reviewed the patient's chart and labs.  Questions were answered to the patient's satisfaction.    No missed doses of Eliquis  for the last 3 weeks, per patient.  Contact person: Wife, Carmelina Chinchilla.   Informed Consent   Shared Decision Making/Informed Consent The risks (stroke, cardiac arrhythmias rarely resulting in the need for a temporary or permanent pacemaker, skin irritation or burns and complications associated with conscious sedation including aspiration, arrhythmia, respiratory failure and death), benefits (restoration of normal sinus rhythm) and alternatives of a direct current cardioversion were explained in detail to Mr. Thier and he agrees to proceed.       Awilda Bogus, [facility]  A Division of  [facility] [address] [place], [place] [address]  [place], [place] [address] 9:18 AM 02/09/24

## 2024-02-11 ENCOUNTER — Other Ambulatory Visit: Payer: Self-pay

## 2024-02-11 DIAGNOSIS — I4891 Unspecified atrial fibrillation: Secondary | ICD-10-CM

## 2024-02-16 ENCOUNTER — Other Ambulatory Visit: Payer: Self-pay

## 2024-02-16 ENCOUNTER — Encounter (HOSPITAL_COMMUNITY): Payer: Self-pay

## 2024-02-17 ENCOUNTER — Ambulatory Visit (HOSPITAL_COMMUNITY)
Admission: RE | Admit: 2024-02-17 | Discharge: 2024-02-17 | Disposition: A | Discharge status: Home / Self Care | Source: Ambulatory Visit | Attending: Cardiology | Admitting: Cardiology

## 2024-02-17 DIAGNOSIS — I4891 Unspecified atrial fibrillation: Secondary | ICD-10-CM

## 2024-02-17 DIAGNOSIS — I4892 Unspecified atrial flutter: Secondary | ICD-10-CM | POA: Diagnosis not present

## 2024-02-17 DIAGNOSIS — I2510 Atherosclerotic heart disease of native coronary artery without angina pectoris: Secondary | ICD-10-CM

## 2024-02-17 DIAGNOSIS — I10 Essential (primary) hypertension: Secondary | ICD-10-CM | POA: Diagnosis not present

## 2024-02-17 LAB — NM PET CT CARDIAC PERFUSION MULTI W/ABSOLUTE BLOODFLOW
LV dias vol: 69 mL (ref 62–150)
LV sys vol: 31 mL
MBFR: 1.57
Nuc Rest EF: 55 %
Nuc Stress EF: 53 %
Rest MBF: 0.83 ml/g/min
Rest Nuclear Isotope Dose: 24.6 mCi
ST Depression (mm): 0 mm
Stress MBF: 1.3 ml/g/min
Stress Nuclear Isotope Dose: 24.5 mCi

## 2024-02-17 MED ORDER — RUBIDIUM RB82 GENERATOR (RUBYFILL)
24.6100 | PACK | Freq: Once | INTRAVENOUS | Status: AC
  Administered 2024-02-17: 24.61 via INTRAVENOUS

## 2024-02-17 MED ORDER — REGADENOSON 0.4 MG/5ML IV SOLN
INTRAVENOUS | Status: AC
  Filled 2024-02-17: qty 5

## 2024-02-17 MED ORDER — REGADENOSON 0.4 MG/5ML IV SOLN
0.4000 mg | Freq: Once | INTRAVENOUS | Status: AC
  Administered 2024-02-17: 0.4 mg via INTRAVENOUS

## 2024-02-17 MED ORDER — RUBIDIUM RB82 GENERATOR (RUBYFILL)
24.6000 | PACK | Freq: Once | INTRAVENOUS | Status: AC
  Administered 2024-02-17: 24.6 via INTRAVENOUS

## 2024-02-23 ENCOUNTER — Other Ambulatory Visit: Payer: Self-pay | Admitting: Cardiology

## 2024-03-18 IMAGING — DX DG CHEST 2V
2 series · 2 of 2 positions shown · non-contrast
Comparison: Chest x-ray 02/05/2022.

CLINICAL DATA: Pneumonia follow-up.

EXAM:
CHEST - 2 VIEW

[chest pa]
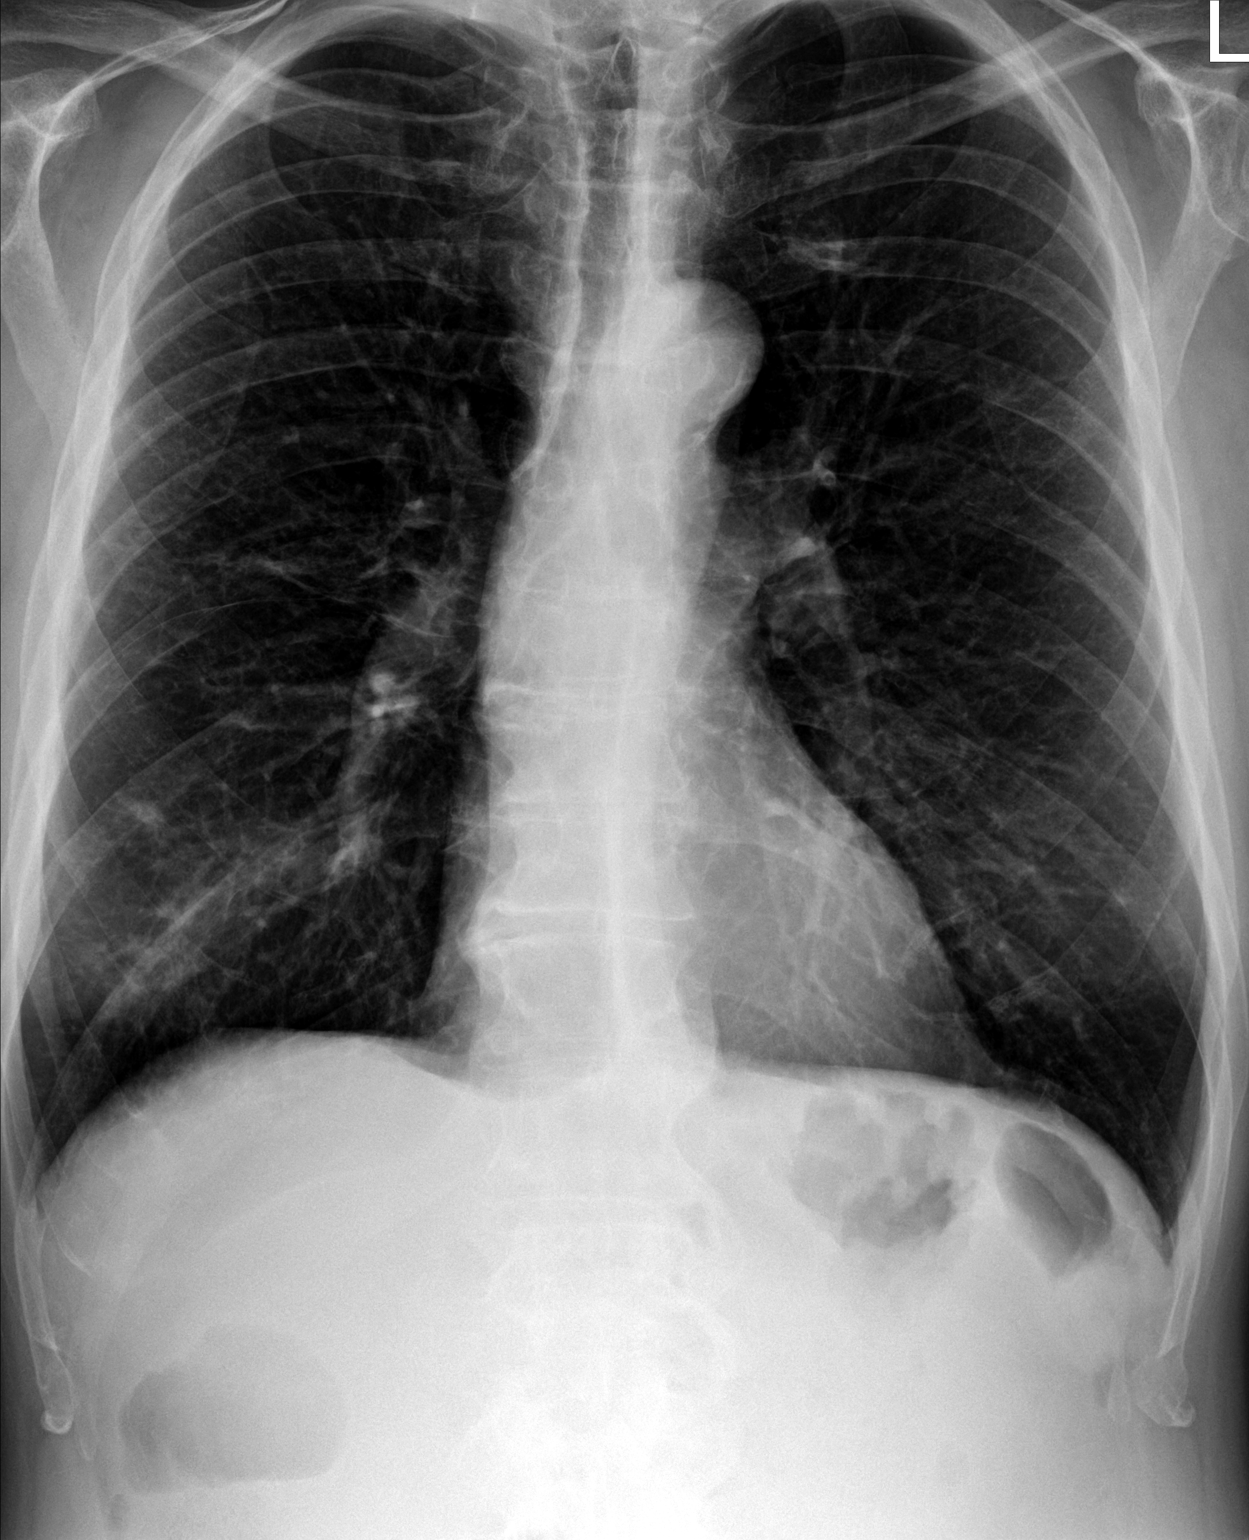

[chest lat]
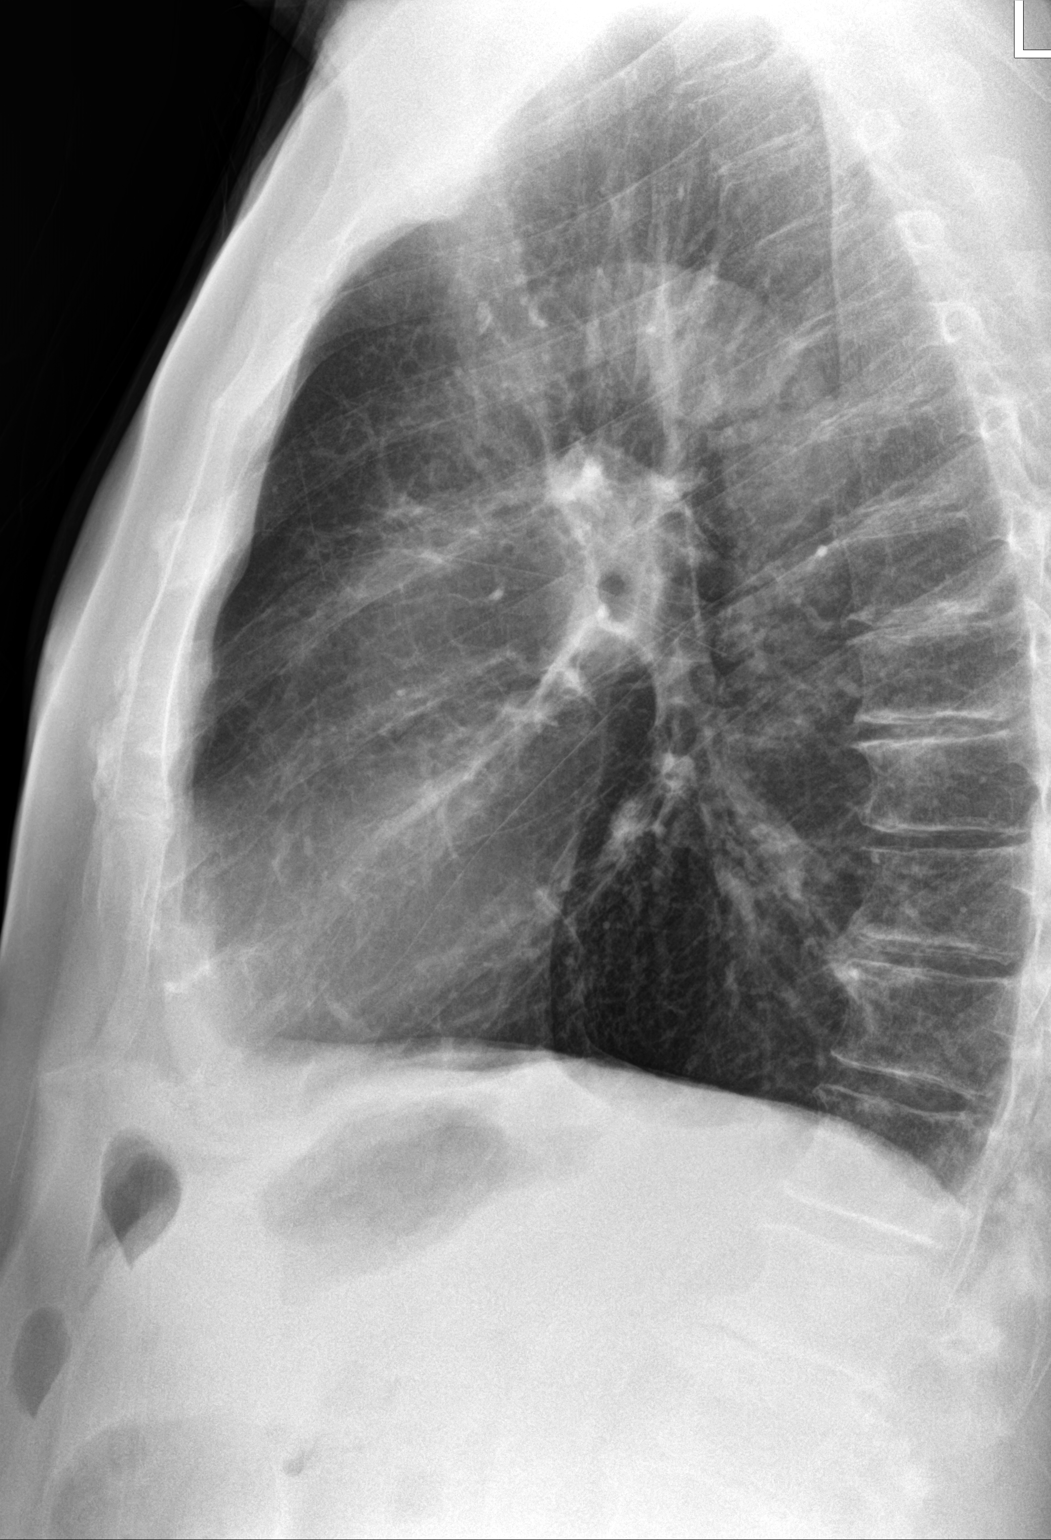

[2 of 2 positions shown; findings below may reference images not displayed]

FINDINGS: There is persistent patchy airspace opacity in the right lower lung,
minimally decreased from prior. At least 1 small nodular area is
present measuring 7 mm, slightly decreased from prior. No new focal
lung infiltrate, pleural effusion or pneumothorax. Cardiomediastinal
silhouette is within normal limits. Osseous structures are within
normal limits.
IMPRESSION: 1. Right lower lung airspace disease has decreased, but has not
completely resolved. Follow-up chest x-ray recommended in 4-6 weeks
to confirm complete resolution.

## 2024-03-22 ENCOUNTER — Telehealth: Payer: Self-pay | Admitting: *Deleted

## 2024-03-22 NOTE — Telephone Encounter (Signed)
 Pt returned my call and we have cancelled his follow-up appointment with Glendia Ferrier, 05/03/24 since he is having his Ablation 04/16/24.  We have put in a recall for 6 months.

## 2024-03-22 NOTE — Telephone Encounter (Signed)
 Placed call to pt regarding appt with Glendia Ferrier, 05/03/24.  From the time of seeing Glendia last, pt has already had a Cardioversion and is now scheduled for a Ablation in August.  Per Glendia, pt can push out his general cards appt 6 months and can cancel appt 05/03/24.  Left a message for pt to call back.

## 2024-03-30 ENCOUNTER — Encounter: Payer: Self-pay | Admitting: Family Medicine

## 2024-03-30 ENCOUNTER — Ambulatory Visit: Payer: Self-pay | Admitting: Family Medicine

## 2024-03-30 VITALS — BP 145/87 | HR 50 | Ht 71.0 in | Wt 206.0 lb

## 2024-03-30 DIAGNOSIS — I10 Essential (primary) hypertension: Secondary | ICD-10-CM | POA: Diagnosis not present

## 2024-03-30 DIAGNOSIS — Z125 Encounter for screening for malignant neoplasm of prostate: Secondary | ICD-10-CM | POA: Diagnosis not present

## 2024-03-30 DIAGNOSIS — I2510 Atherosclerotic heart disease of native coronary artery without angina pectoris: Secondary | ICD-10-CM

## 2024-03-30 DIAGNOSIS — E782 Mixed hyperlipidemia: Secondary | ICD-10-CM | POA: Diagnosis not present

## 2024-03-30 DIAGNOSIS — I4819 Other persistent atrial fibrillation: Secondary | ICD-10-CM

## 2024-03-30 LAB — LIPID PANEL

## 2024-03-30 MED ORDER — LISINOPRIL 40 MG PO TABS: 40.0000 mg | ORAL_TABLET | Freq: Every day | ORAL | 3 refills | Status: AC

## 2024-03-30 NOTE — Progress Notes (Signed)
 BP (!) 145/87   Pulse (!) 50   Ht 5' 11 (1.803 m)   Wt 206 lb (93.4 kg)   SpO2 95%   BMI 28.73 kg/m    Subjective:   Patient ID: Louis [name], male    DOB: 19-Sep-1955, 68 y.o.   MRN: [id number]  HPI: Louis [name] is a 68 y.o. male presenting on 03/30/2024 for Medical Management of Chronic Issues, Hyperlipidemia, and Hypertension   HPI Hypertension Patient is currently on lisinopril  and propranolol  and diltiazem , and their blood pressure today is 145/87. Patient denies any lightheadedness or dizziness. Patient denies headaches, blurred vision, chest pains, shortness of breath, or weakness. Denies any side effects from medication and is content with current medication.   Hyperlipidemia and A-fib and CAD, sees cardiology Patient is coming in for recheck of his hyperlipidemia. The patient is currently taking Eliquis  and Crestor . They deny any issues with myalgias or history of liver damage from it. They deny any focal numbness or weakness or chest pain.  To get an ablation for his A-fib within the next couple weeks with cardiology.  They already tried to do cardioversion and it did not stick.  Relevant past medical, surgical, family and social history reviewed and updated as indicated. Interim medical history since our last visit reviewed. Allergies and medications reviewed and updated.  Review of Systems  Constitutional:  Positive for fatigue. Negative for chills and fever.  Eyes:  Negative for discharge.  Respiratory:  Negative for shortness of breath and wheezing.   Cardiovascular:  Positive for palpitations. Negative for chest pain and leg swelling.  Musculoskeletal:  Negative for back pain and gait problem.  Skin:  Negative for rash.  All other systems reviewed and are negative.   Per HPI unless specifically indicated above   Allergies as of 03/30/2024       Reactions   Penicillins Other (See Comments)   Occurred as child not sure what type of reaction he had   Tramadol  Other (See Comments)   Patient states it made him feel like he had the flu Other Reaction(s): Fatigue        Medication List        Accurate as of March 30, 2024  2:43 PM. If you have any questions, ask your nurse or doctor.          apixaban  5 MG Tabs tablet Commonly known as: ELIQUIS  Take 1 tablet (5 mg total) by mouth 2 (two) times daily.   diltiazem  180 MG 24 hr capsule Commonly known as: CARDIZEM  CD TAKE 1 CAPSULE BY MOUTH EVERY DAY   lisinopril  40 MG tablet Commonly known as: ZESTRIL  Take 1 tablet (40 mg total) by mouth daily.   propranolol  ER 80 MG 24 hr capsule Commonly known as: INDERAL  LA Take 1 capsule (80 mg total) by mouth daily. Hold if systolic blood pressure (top number) less than 100 mmHg or pulse less than 55 bpm.   rosuvastatin  20 MG tablet Commonly known as: CRESTOR  TAKE 1 TABLET BY MOUTH EVERY DAY         Objective:   BP (!) 145/87   Pulse (!) 50   Ht 5' 11 (1.803 m)   Wt 206 lb (93.4 kg)   SpO2 95%   BMI 28.73 kg/m   Wt Readings from Last 3 Encounters:  03/30/24 206 lb (93.4 kg)  01/27/24 210 lb 9.6 oz (95.5 kg)  01/06/24 205 lb (93 kg)    Physical Exam Vitals and nursing  note reviewed.  Constitutional:      General: He is not in acute distress.    Appearance: He is well-developed. He is not diaphoretic.  Eyes:     General: No scleral icterus.    Conjunctiva/sclera: Conjunctivae normal.  Neck:     Thyroid : No thyromegaly.  Cardiovascular:     Rate and Rhythm: Normal rate. Rhythm irregular.     Heart sounds: Normal heart sounds. No murmur heard. Pulmonary:     Effort: Pulmonary effort is normal. No respiratory distress.     Breath sounds: Normal breath sounds. No wheezing.  Musculoskeletal:        General: Normal range of motion.     Cervical back: Neck supple.  Lymphadenopathy:     Cervical: No cervical adenopathy.  Skin:    General: Skin is warm and dry.     Findings: No rash.  Neurological:     Mental Status: He  is alert and oriented to person, place, and time.     Coordination: Coordination normal.  Psychiatric:        Behavior: Behavior normal.       Assessment & Plan:   Problem List Items Addressed This Visit       Cardiovascular and Mediastinum   HTN (hypertension)   Relevant Medications   lisinopril  (ZESTRIL ) 40 MG tablet   Other Relevant Orders   CBC with Differential/Platelet   CMP14+EGFR   Persistent atrial fibrillation (HCC)   Relevant Medications   lisinopril  (ZESTRIL ) 40 MG tablet   Other Relevant Orders   CBC with Differential/Platelet   CMP14+EGFR   CAD (coronary artery disease)   Relevant Medications   lisinopril  (ZESTRIL ) 40 MG tablet     Other   HLD (hyperlipidemia) - Primary   Relevant Medications   lisinopril  (ZESTRIL ) 40 MG tablet   Other Relevant Orders   CMP14+EGFR   Lipid panel   Other Visit Diagnoses       Essential hypertension       Relevant Medications   lisinopril  (ZESTRIL ) 40 MG tablet     Prostate cancer screening       Relevant Orders   PSA, total and free       Seems to be doing well, no change in medication.  Will go for ablation with cardiology soon. Follow up plan: Return in about 6 months (around 09/30/2024), or if symptoms worsen or fail to improve, for Physical exam and hypertension.  Counseling provided for all of the vaccine components Orders Placed This Encounter  Procedures   CBC with Differential/Platelet   CMP14+EGFR   Lipid panel   PSA, total and free    Fonda Levins, MD [facility] 03/30/2024, 2:43 PM

## 2024-03-31 LAB — CMP14+EGFR
ALT: 35 IU/L (ref 0–44)
AST: 33 IU/L (ref 0–40)
Albumin: 4.5 g/dL (ref 3.9–4.9)
Alkaline Phosphatase: 68 IU/L (ref 44–121)
BUN/Creatinine Ratio: 15 (ref 10–24)
BUN: 19 mg/dL (ref 8–27)
Bilirubin Total: 0.5 mg/dL (ref 0.0–1.2)
CO2: 24 mmol/L (ref 20–29)
Calcium: 9.4 mg/dL (ref 8.6–10.2)
Chloride: 101 mmol/L (ref 96–106)
Creatinine, Ser: 1.29 mg/dL — AB (ref 0.76–1.27)
Globulin, Total: 2.3 g/dL (ref 1.5–4.5)
Glucose: 83 mg/dL (ref 70–99)
Potassium: 4.1 mmol/L (ref 3.5–5.2)
Sodium: 143 mmol/L (ref 134–144)
Total Protein: 6.8 g/dL (ref 6.0–8.5)
eGFR: 61 mL/min/1.73 (ref >59)

## 2024-03-31 LAB — LIPID PANEL
Cholesterol, Total: 135 mg/dL (ref 100–199)
HDL: 54 mg/dL (ref >39)
LDL CALC COMMENT:: 2.5 ratio (ref 0.0–5.0)
LDL Chol Calc (NIH): 57 mg/dL (ref 0–99)
Triglycerides: 141 mg/dL (ref 0–149)
VLDL Cholesterol Cal: 24 mg/dL (ref 5–40)

## 2024-03-31 LAB — CBC WITH DIFFERENTIAL/PLATELET
Basophils Absolute: 0.1 x10E3/uL (ref 0.0–0.2)
Basos: 1 %
EOS (ABSOLUTE): 0.2 x10E3/uL (ref 0.0–0.4)
Eos: 3 %
Hematocrit: 41.5 % (ref 37.5–51.0)
Hemoglobin: 13.9 g/dL (ref 13.0–17.7)
Immature Grans (Abs): 0 x10E3/uL (ref 0.0–0.1)
Immature Granulocytes: 0 %
Lymphocytes Absolute: 2.4 x10E3/uL (ref 0.7–3.1)
Lymphs: 32 %
MCH: 32.6 pg (ref 26.6–33.0)
MCHC: 33.5 g/dL (ref 31.5–35.7)
MCV: 97 fL (ref 79–97)
Monocytes Absolute: 0.7 x10E3/uL (ref 0.1–0.9)
Monocytes: 9 %
Neutrophils Absolute: 4.1 x10E3/uL (ref 1.4–7.0)
Neutrophils: 55 %
Platelets: 194 x10E3/uL (ref 150–450)
RBC: 4.26 x10E6/uL (ref 4.14–5.80)
RDW: 12.4 % (ref 11.6–15.4)
WBC: 7.5 x10E3/uL (ref 3.4–10.8)

## 2024-03-31 LAB — PSA, TOTAL AND FREE
PSA, Free Pct: 42.5
PSA, Free: 0.17 ng/mL
Prostate Specific Ag, Serum: 0.4 ng/mL (ref 0.0–4.0)

## 2024-04-05 ENCOUNTER — Ambulatory Visit (HOSPITAL_COMMUNITY)
Admission: RE | Admit: 2024-04-05 | Discharge: 2024-04-05 | Disposition: A | Discharge status: Home / Self Care | Source: Ambulatory Visit | Attending: Cardiology | Admitting: Cardiology

## 2024-04-05 DIAGNOSIS — I4892 Unspecified atrial flutter: Secondary | ICD-10-CM | POA: Insufficient documentation

## 2024-04-05 DIAGNOSIS — I4891 Unspecified atrial fibrillation: Secondary | ICD-10-CM | POA: Insufficient documentation

## 2024-04-05 MED ORDER — IOHEXOL 350 MG/ML SOLN
100.0000 mL | Freq: Once | INTRAVENOUS | Status: AC | PRN
  Administered 2024-04-05: 100 mL via INTRAVENOUS

## 2024-04-07 ENCOUNTER — Ambulatory Visit: Payer: Self-pay | Admitting: Family Medicine

## 2024-04-12 ENCOUNTER — Telehealth (HOSPITAL_COMMUNITY): Payer: Self-pay

## 2024-04-12 NOTE — Telephone Encounter (Signed)
 Attempted to reach patient to discuss upcoming procedure, no answer. Left VM for patient to return call.

## 2024-04-13 NOTE — Telephone Encounter (Signed)
 Patient returned call to discuss upcoming procedure.   CT: completed.  Labs: completed.   Any recent signs of acute illness or been started on antibiotics? No Any new medications started? No Any medications to hold? No Any missed doses of blood thinner? No  Advised patient to continue taking ANTICOAGULANT: Eliquis  (Apixaban ) twice daily without missing any doses.  Medication instructions:  On the morning of your procedure DO NOT take any medication., including Eliquis  or the procedure may be rescheduled. Nothing to eat or drink after midnight prior to your procedure.  Confirmed patient is scheduled for Atrial Fibrillation Ablation on Wednesday, August 6 with Dr. Ole Holts. Instructed patient to arrive at the Main Entrance A at [facility]: [address] [place], [place] [address] and check in at Admitting at 9:00 AM.   Advised of plan to go home the same day and will only stay overnight if medically necessary. You MUST have a responsible adult to drive you home and MUST be with you the first 24 hours after you arrive home or your procedure could be cancelled.  Patient verbalized understanding to all instructions provided and agreed to proceed with procedure.

## 2024-04-19 NOTE — Pre-Procedure Instructions (Signed)
 Attempted to call patient regarding procedure instructions.  Left voicemail on the following items: Arrival time 0830 Nothing to eat or drink after midnight No meds AM of procedure Responsible person to drive you home and stay with you for 24 hrs  Have you missed any doses of anti-coagulant Eliquis - should be taken twice a day, if you have missed any doses please let us  know.  Don't take dose morning of procedure.

## 2024-04-20 ENCOUNTER — Ambulatory Visit (HOSPITAL_COMMUNITY): Payer: Self-pay | Admitting: Anesthesiology

## 2024-04-20 ENCOUNTER — Other Ambulatory Visit (HOSPITAL_COMMUNITY): Payer: Self-pay

## 2024-04-20 ENCOUNTER — Other Ambulatory Visit: Payer: Self-pay

## 2024-04-20 ENCOUNTER — Ambulatory Visit (HOSPITAL_COMMUNITY)
Admission: RE | Admit: 2024-04-20 | Discharge: 2024-04-20 | Disposition: A | Discharge status: Home / Self Care | Attending: Cardiology | Admitting: Cardiology

## 2024-04-20 ENCOUNTER — Encounter (HOSPITAL_COMMUNITY)
Admission: RE | Disposition: A | Discharge status: Home / Self Care | Payer: Self-pay | Source: Home / Self Care | Attending: Cardiology

## 2024-04-20 ENCOUNTER — Ambulatory Visit (HOSPITAL_BASED_OUTPATIENT_CLINIC_OR_DEPARTMENT_OTHER): Payer: Self-pay | Admitting: Anesthesiology

## 2024-04-20 DIAGNOSIS — E785 Hyperlipidemia, unspecified: Secondary | ICD-10-CM | POA: Diagnosis not present

## 2024-04-20 DIAGNOSIS — I10 Essential (primary) hypertension: Secondary | ICD-10-CM | POA: Diagnosis not present

## 2024-04-20 DIAGNOSIS — K219 Gastro-esophageal reflux disease without esophagitis: Secondary | ICD-10-CM | POA: Diagnosis not present

## 2024-04-20 DIAGNOSIS — R0609 Other forms of dyspnea: Secondary | ICD-10-CM | POA: Insufficient documentation

## 2024-04-20 DIAGNOSIS — I2510 Atherosclerotic heart disease of native coronary artery without angina pectoris: Secondary | ICD-10-CM | POA: Diagnosis not present

## 2024-04-20 DIAGNOSIS — I4891 Unspecified atrial fibrillation: Secondary | ICD-10-CM

## 2024-04-20 DIAGNOSIS — Z87891 Personal history of nicotine dependence: Secondary | ICD-10-CM

## 2024-04-20 DIAGNOSIS — Z7901 Long term (current) use of anticoagulants: Secondary | ICD-10-CM | POA: Insufficient documentation

## 2024-04-20 DIAGNOSIS — I483 Typical atrial flutter: Secondary | ICD-10-CM

## 2024-04-20 HISTORY — PX: ATRIAL FIBRILLATION ABLATION: EP1191

## 2024-04-20 LAB — POCT ACTIVATED CLOTTING TIME: Activated Clotting Time: 320 s

## 2024-04-20 MED ORDER — COLCHICINE 0.6 MG PO TABS
0.6000 mg | ORAL_TABLET | Freq: Two times a day (BID) | ORAL | 0 refills | Status: DC
  Filled 2024-04-20: qty 10, 5d supply, fill #0

## 2024-04-20 MED ORDER — ALBUMIN HUMAN 5 % IV SOLN: INTRAVENOUS | Status: DC | PRN

## 2024-04-20 MED ORDER — SODIUM CHLORIDE 0.9 % IV SOLN
250.0000 mL | INTRAVENOUS | Status: DC | PRN
Start: 2024-04-20 — End: 2024-04-20

## 2024-04-20 MED ORDER — PANTOPRAZOLE SODIUM 40 MG PO TBEC
40.0000 mg | DELAYED_RELEASE_TABLET | Freq: Every day | ORAL | 0 refills | Status: DC
  Filled 2024-04-20: qty 45, 45d supply, fill #0

## 2024-04-20 MED ORDER — ACETAMINOPHEN 500 MG PO TABS
1000.0000 mg | ORAL_TABLET | Freq: Once | ORAL | Status: AC
  Administered 2024-04-20: 1000 mg via ORAL

## 2024-04-20 MED ORDER — PANTOPRAZOLE SODIUM 40 MG PO TBEC
40.0000 mg | DELAYED_RELEASE_TABLET | Freq: Every day | ORAL | Status: DC
  Administered 2024-04-20: 40 mg via ORAL
  Filled 2024-04-20: qty 1

## 2024-04-20 MED ORDER — SODIUM CHLORIDE 0.9% FLUSH
3.0000 mL | INTRAVENOUS | Status: DC | PRN
Start: 2024-04-20 — End: 2024-04-20

## 2024-04-20 MED ORDER — PROPOFOL 10 MG/ML IV BOLUS
INTRAVENOUS | Status: DC | PRN
  Administered 2024-04-20: 150 mg via INTRAVENOUS
  Administered 2024-04-20: 50 mg via INTRAVENOUS

## 2024-04-20 MED ORDER — PHENYLEPHRINE HCL-NACL 20-0.9 MG/250ML-% IV SOLN
INTRAVENOUS | Status: DC | PRN
  Administered 2024-04-20: 30 ug/min via INTRAVENOUS
  Administered 2024-04-20: 15 ug/min via INTRAVENOUS

## 2024-04-20 MED ORDER — ONDANSETRON HCL 4 MG/2ML IJ SOLN: 4.0000 mg | Freq: Four times a day (QID) | INTRAMUSCULAR | Status: DC | PRN

## 2024-04-20 MED ORDER — SODIUM CHLORIDE 0.9 % IV SOLN: INTRAVENOUS | Status: DC

## 2024-04-20 MED ORDER — HEPARIN SODIUM (PORCINE) 1000 UNIT/ML IJ SOLN
INTRAMUSCULAR | Status: DC | PRN
  Administered 2024-04-20: 14000 [IU] via INTRAVENOUS

## 2024-04-20 MED ORDER — LIDOCAINE 2% (20 MG/ML) 5 ML SYRINGE
INTRAMUSCULAR | Status: DC | PRN
  Administered 2024-04-20: 20 mg via INTRAVENOUS

## 2024-04-20 MED ORDER — ONDANSETRON HCL 4 MG/2ML IJ SOLN
INTRAMUSCULAR | Status: DC | PRN
  Administered 2024-04-20: 4 mg via INTRAVENOUS

## 2024-04-20 MED ORDER — PHENYLEPHRINE 80 MCG/ML (10ML) SYRINGE FOR IV PUSH (FOR BLOOD PRESSURE SUPPORT)
PREFILLED_SYRINGE | INTRAVENOUS | Status: DC | PRN
  Administered 2024-04-20: 80 ug via INTRAVENOUS
  Administered 2024-04-20: 160 ug via INTRAVENOUS
  Administered 2024-04-20 (×2): 80 ug via INTRAVENOUS
  Administered 2024-04-20: 160 ug via INTRAVENOUS

## 2024-04-20 MED ORDER — SODIUM CHLORIDE 0.9% FLUSH: 3.0000 mL | Freq: Two times a day (BID) | INTRAVENOUS | Status: DC

## 2024-04-20 MED ORDER — PROTAMINE SULFATE 10 MG/ML IV SOLN
INTRAVENOUS | Status: DC | PRN
  Administered 2024-04-20: 35 mg via INTRAVENOUS

## 2024-04-20 MED ORDER — ROCURONIUM BROMIDE 10 MG/ML (PF) SYRINGE
PREFILLED_SYRINGE | INTRAVENOUS | Status: DC | PRN
  Administered 2024-04-20: 5 mg via INTRAVENOUS
  Administered 2024-04-20: 60 mg via INTRAVENOUS
  Administered 2024-04-20: 5 mg via INTRAVENOUS

## 2024-04-20 MED ORDER — HEPARIN SODIUM (PORCINE) 1000 UNIT/ML IJ SOLN
INTRAMUSCULAR | Status: AC
Start: 2024-04-20 — End: 2024-04-20
  Filled 2024-04-20: qty 10

## 2024-04-20 MED ORDER — HEPARIN (PORCINE) IN NACL 1000-0.9 UT/500ML-% IV SOLN
INTRAVENOUS | Status: DC | PRN
  Administered 2024-04-20 (×2): 500 mL

## 2024-04-20 MED ORDER — FENTANYL CITRATE (PF) 250 MCG/5ML IJ SOLN
INTRAMUSCULAR | Status: DC | PRN
  Administered 2024-04-20 (×2): 50 ug via INTRAVENOUS

## 2024-04-20 MED ORDER — ATROPINE SULFATE 1 MG/10ML IJ SOSY
PREFILLED_SYRINGE | INTRAMUSCULAR | Status: DC | PRN
  Administered 2024-04-20: 1 mg via INTRAVENOUS

## 2024-04-20 MED ORDER — DEXAMETHASONE SODIUM PHOSPHATE 10 MG/ML IJ SOLN
INTRAMUSCULAR | Status: DC | PRN
  Administered 2024-04-20: 5 mg via INTRAVENOUS

## 2024-04-20 MED ORDER — COLCHICINE 0.6 MG PO TABS
0.6000 mg | ORAL_TABLET | Freq: Two times a day (BID) | ORAL | Status: DC
  Administered 2024-04-20: 0.6 mg via ORAL
  Filled 2024-04-20: qty 1

## 2024-04-20 MED ORDER — ACETAMINOPHEN 500 MG PO TABS
ORAL_TABLET | ORAL | Status: AC
  Filled 2024-04-20: qty 2

## 2024-04-20 MED ORDER — FENTANYL CITRATE (PF) 100 MCG/2ML IJ SOLN
INTRAMUSCULAR | Status: AC
  Filled 2024-04-20: qty 2

## 2024-04-20 MED ORDER — ACETAMINOPHEN 325 MG PO TABS
650.0000 mg | ORAL_TABLET | ORAL | Status: DC | PRN
  Administered 2024-04-20: 650 mg via ORAL

## 2024-04-20 MED ORDER — ACETAMINOPHEN 325 MG PO TABS
ORAL_TABLET | ORAL | Status: AC
  Filled 2024-04-20: qty 2

## 2024-04-20 MED ORDER — SUGAMMADEX SODIUM 200 MG/2ML IV SOLN
INTRAVENOUS | Status: DC | PRN
  Administered 2024-04-20: 200 mg via INTRAVENOUS

## 2024-04-20 MED ORDER — APIXABAN 5 MG PO TABS
5.0000 mg | ORAL_TABLET | Freq: Two times a day (BID) | ORAL | Status: DC
  Administered 2024-04-20: 5 mg via ORAL
  Filled 2024-04-20: qty 1

## 2024-04-20 MED ORDER — SODIUM CHLORIDE 0.9 % IV SOLN
INTRAVENOUS | Status: DC | PRN
  Administered 2024-04-20 (×2): 10 mL/h via INTRAVENOUS

## 2024-04-20 NOTE — Anesthesia Procedure Notes (Signed)
 Procedure Name: Intubation Date/Time: 04/20/2024 10:24 AM  Performed by: Atanacio Arland HERO, CRNAPre-anesthesia Checklist: Patient identified, Emergency Drugs available, Suction available and Patient being monitored Patient Re-evaluated:Patient Re-evaluated prior to induction Oxygen Delivery Method: Circle System Utilized Preoxygenation: Pre-oxygenation with 100% oxygen Induction Type: IV induction Ventilation: Mask ventilation without difficulty Laryngoscope Size: Mac and 4 Grade View: Grade I Tube type: Oral Tube size: 7.5 mm Number of attempts: 1 Airway Equipment and Method: Stylet Placement Confirmation: ETT inserted through vocal cords under direct vision, positive ETCO2 and breath sounds checked- equal and bilateral Secured at: 23 cm Tube secured with: Tape Dental Injury: Teeth and Oropharynx as per pre-operative assessment

## 2024-04-20 NOTE — H&P (Signed)
 Electrophysiology Office Note:     Date:  04/20/2024    ID:  Louis [name], DOB 1956/07/08, MRN [id number]   [facility] Cardiologist:  Oneil Parchment, MD  [facility] Electrophysiologist:  OLE ONEIDA HOLTS, MD    Referring MD: Lelon Glendia ONEIDA, PA-C    Chief Complaint: Atrial fibrillation and flutter   History of Present Illness:     Louis [name] is a 68 year old man who I am seeing today for an evaluation of atrial fibrillation and flutter at the request of Glendia Lelon.  The patient has a history of atrial fibrillation with a prior monitor in 2024 showing a 3% burden.  He also carries a diagnosis of coronary artery disease, hypertension, hyperlipidemia.  He has atrial flutter as well.  He has dyspnea on exertion.  He takes Eliquis  for stroke prophylaxis.   Today he reports fatigue and shortness of breath while in atrial fibrillation.  He has been taking his Eliquis  appropriately for 3 weeks.  He does not describe outright chest pain but describes more shortness of breath with exertion.  Presents for AF ablation. Procedure reviewed.     Objective Their past medical, social and family history was reviewed.     ROS:   Please see the history of present illness.    All other systems reviewed and are negative.   EKGs/Labs/Other Studies Reviewed:     The following studies were reviewed today:   January 06, 2024 EKG shows typical appearing atrial flutter, ventricular rate 79 bpm   December 24, 2022 EKG shows sinus bradycardia, ventricular rate 49 bpm, incomplete right bundle branch block.  QTc 429 ms   December 01, 2022 EKG shows atrial fibrillation, incomplete right bundle branch block, ventricular rate 115 bpm        Physical Exam:     VS:  BP 147/93   Pulse 108   Ht 5' 11 (1.803 m)   Wt 210 lb 9.6 oz (95.5 kg)   SpO2 94%   BMI 29.37 kg/m         Wt Readings from Last 3 Encounters:  01/27/24 210 lb 9.6 oz (95.5 kg)  01/06/24 205 lb (93 kg)  04/03/23 200 lb 12.8 oz (91.1  kg)      GEN: no distress CARD: Irregularly irregular, No MRG RESP: No IWOB. CTAB.         Assessment ASSESSMENT AND PLAN:     1. Atrial fibrillation/flutter (HCC)   2. Coronary artery disease involving native coronary artery of native heart without angina pectoris   3. Essential hypertension       #Atrial fibrillation flutter Symptomatic On Eliquis  for stroke prophylaxis I discussed treatment options for the patient during today's clinic appointment.  I discussed the catheter ablation procedure in detail including the risks, recovery and likelihood of success.  He is interested in proceeding.   I would like to get the PET stress done before ablation to exclude significant coronary artery disease.   Discussed treatment options today for AF including antiarrhythmic drug therapy and ablation. Discussed risks, recovery and likelihood of success with each treatment strategy. Risk, benefits, and alternatives to EP study and ablation for afib were discussed. These risks include but are not limited to stroke, bleeding, vascular damage, tamponade, perforation, damage to the esophagus, lungs, phrenic nerve and other structures, pulmonary vein stenosis, worsening renal function, coronary vasospasm and death.  Discussed potential need for repeat ablation procedures and antiarrhythmic drugs after an initial ablation. The patient understands these risk  and wishes to proceed.  We will therefore proceed with catheter ablation at the next available time.  Carto, ICE, anesthesia are requested for the procedure.  Will also obtain CT PV protocol prior to the procedure to exclude LAA thrombus and further evaluate atrial anatomy.   Before the ablation, plan for cardioversion to see if this helps some of his symptoms.  I discussed the cardioversion procedure in detail including the risks and recovery and he would like to proceed.    Presents for AF ablation. Procedure reviewed.       Signed, Ole DASEN.  Cindie, MD, [facility], [facility] 04/20/2024 Electrophysiology [facility]

## 2024-04-20 NOTE — Anesthesia Postprocedure Evaluation (Signed)
 Anesthesia Post Note  Patient: Louis [name]  Procedure(s) Performed: ATRIAL FIBRILLATION ABLATION     Patient location during evaluation: Cath Lab Anesthesia Type: General Level of consciousness: awake and alert, oriented and patient cooperative Pain management: pain level controlled Vital Signs Assessment: post-procedure vital signs reviewed and stable Respiratory status: spontaneous breathing, nonlabored ventilation and respiratory function stable Cardiovascular status: blood pressure returned to baseline and stable Postop Assessment: no apparent nausea or vomiting Anesthetic complications: no   There were no known notable events for this encounter.  Last Vitals:  Vitals:   04/20/24 0900 04/20/24 1214  BP: (!) 147/93   Pulse: (!) 108 72  Resp: (!) 21 20  Temp: 36.7 C   SpO2: 98% 97%    Last Pain:  Vitals:   04/20/24 0900  TempSrc: Oral  PainSc:                  Ismaeel Arvelo,E. Elad Macphail

## 2024-04-20 NOTE — Progress Notes (Signed)
 PT ambulated in the hallway to the bathroom where he was able to void. No S.S of bleeding or surgical complications.  Discharge instructions reviewed with patient and wife at bedside. Denies questions or concerns. IV removed. PT escorted off the unit via wheel chair to personal vehicle.

## 2024-04-20 NOTE — Anesthesia Preprocedure Evaluation (Addendum)
 Anesthesia Evaluation  Patient identified by MRN, date of birth, ID band Patient awake    Reviewed: Allergy & Precautions, NPO status , Patient's Chart, lab work & pertinent test results, reviewed documented beta blocker date and time   History of Anesthesia Complications Negative for: history of anesthetic complications  Airway Mallampati: II  TM Distance: >3 FB Neck ROM: Full    Dental  (+) Dental Advisory Given, Teeth Intact   Pulmonary neg pulmonary ROS, former smoker   breath sounds clear to auscultation       Cardiovascular hypertension, Pt. on medications and Pt. on home beta blockers + dysrhythmias Atrial Fibrillation  Rhythm:Regular Rate:Normal  11/2022 ECHO: EF 60 to 65%.  1.The LV has normal function, no regional wall motion abnormalities. Left ventricular diastolic parameters were normal.   2. RVF is normal. The right ventricular size is normal.   3. The mitral valve is normal in structure. No evidence of mitral valve regurgitation. No evidence of mitral stenosis.   4. The aortic valve is normal in structure. Aortic valve regurgitation is not visualized. No aortic stenosis is present.     Neuro/Psych   Anxiety     negative neurological ROS     GI/Hepatic Neg liver ROS,GERD  Controlled,,  Endo/Other  negative endocrine ROS    Renal/GU negative Renal ROS     Musculoskeletal   Abdominal   Peds  Hematology eliquis    Anesthesia Other Findings   Reproductive/Obstetrics                              Anesthesia Physical Anesthesia Plan  ASA: 3  Anesthesia Plan: General   Post-op Pain Management: Tylenol  PO (pre-op)*   Induction: Intravenous  PONV Risk Score and Plan: 2 and Ondansetron  and Dexamethasone   Airway Management Planned: Oral ETT  Additional Equipment: None  Intra-op Plan:   Post-operative Plan: Extubation in OR  Informed Consent: I have reviewed the patients  History and Physical, chart, labs and discussed the procedure including the risks, benefits and alternatives for the proposed anesthesia with the patient or authorized representative who has indicated his/her understanding and acceptance.     Dental advisory given  Plan Discussed with: CRNA and Surgeon  Anesthesia Plan Comments:          Anesthesia Quick Evaluation

## 2024-04-20 NOTE — Discharge Instructions (Signed)

## 2024-04-20 NOTE — Transfer of Care (Signed)
 Immediate Anesthesia Transfer of Care Note  Patient: Louis [name]  Procedure(s) Performed: ATRIAL FIBRILLATION ABLATION  Patient Location: PACU  Anesthesia Type:General  Level of Consciousness: drowsy  Airway & Oxygen Therapy: Patient Spontanous Breathing  Post-op Assessment: Report given to RN and Post -op Vital signs reviewed and stable  Post vital signs: Reviewed and stable  Last Vitals:  Vitals Value Taken Time  BP 117/73   Temp 98.5   Pulse 73   Resp 24   SpO2 96     Last Pain:  Vitals:   04/20/24 0900  TempSrc: Oral  PainSc:       Patients Stated Pain Goal: 4 (04/20/24 0850)  Complications: There were no known notable events for this encounter.

## 2024-04-21 ENCOUNTER — Telehealth (HOSPITAL_COMMUNITY): Payer: Self-pay

## 2024-04-21 ENCOUNTER — Ambulatory Visit: Payer: Self-pay | Admitting: Family Medicine

## 2024-04-21 ENCOUNTER — Encounter (HOSPITAL_COMMUNITY): Payer: Self-pay | Admitting: Cardiology

## 2024-04-21 NOTE — Telephone Encounter (Signed)
 Attempted to reach patient to follow up with procedure completed on 04/20/24, no answer. Left VM for patient to return call.

## 2024-04-21 NOTE — Telephone Encounter (Signed)
 Spoke with patient to complete post procedure follow up call.  Patient reports no complications with groin sites.   Instructions reviewed with patient:  Remove large bandage at puncture site after 24 hours. It is normal to have bruising, tenderness, mild swelling, and a pea or marble sized lump/knot at the groin site which can take up to three months to resolve.  Get help right away if you notice sudden swelling at the puncture site.  Check your puncture site every day for signs of infection: fever, redness, swelling, pus drainage, warmth, foul odor or excessive pain. If this occurs, please call the office at [phone], to speak with the nurse. Get help right away if your puncture site is bleeding and the bleeding does not stop after applying firm pressure to the area.  You may continue to have skipped beats/ atrial fibrillation during the first several months after your procedure.  It is very important not to miss any doses of your blood thinner Eliquis .   You will follow up with the Afib clinic on clinic on 05/18/24  and follow up with the APP on 07/21/24.    Patient verbalized understanding to all instructions provided.

## 2024-04-25 ENCOUNTER — Other Ambulatory Visit: Payer: Self-pay | Admitting: Family Medicine

## 2024-04-25 ENCOUNTER — Encounter: Payer: Self-pay | Admitting: Emergency Medicine

## 2024-04-25 DIAGNOSIS — G250 Essential tremor: Secondary | ICD-10-CM

## 2024-04-25 NOTE — Telephone Encounter (Signed)
 Copied from CRM [id number]. Topic: Clinical - Medication Refill >> Apr 25, 2024  9:32 AM Cynthia K wrote: Medication: propranolol  ER (INDERAL  LA) 80 MG 24 hr capsule  Has the patient contacted their pharmacy? Yes (Agent: If no, request that the patient contact the pharmacy for the refill. If patient does not wish to contact the pharmacy document the reason why and proceed with request.) (Agent: If yes, when and what did the pharmacy advise?) Pharmacy needs order to refill  This is the patient's preferred pharmacy:  [id number] - [place], [place] - [address] [place] [address] Phone: [phone] Fax: [phone]   Is this the correct pharmacy for this prescription? Yes If no, delete pharmacy and type the correct one.   Has the prescription been filled recently? No  Is the patient out of the medication? No  Has the patient been seen for an appointment in the last year OR does the patient have an upcoming appointment? Yes  Can we respond through MyChart? Yes  Agent: Please be advised that Rx refills may take up to 3 business days. We ask that you follow-up with your pharmacy.

## 2024-04-26 ENCOUNTER — Other Ambulatory Visit (HOSPITAL_COMMUNITY)

## 2024-04-26 NOTE — Telephone Encounter (Signed)
 Dettinger, since you are not the original prescriber please verify approval for refill.  Last OV in July and pt was taking. BP was 147/87.  Has f/u in Nov with you.

## 2024-04-27 MED ORDER — PROPRANOLOL HCL ER 80 MG PO CP24: 80.0000 mg | ORAL_CAPSULE | Freq: Every day | ORAL | 1 refills | Status: DC

## 2024-05-03 ENCOUNTER — Ambulatory Visit: Admitting: Physician Assistant

## 2024-05-18 ENCOUNTER — Ambulatory Visit (HOSPITAL_COMMUNITY)
Admit: 2024-05-18 | Discharge: 2024-05-18 | Disposition: A | Discharge status: Home / Self Care | Attending: Physician Assistant | Admitting: Physician Assistant

## 2024-05-18 VITALS — BP 142/64 | HR 43 | Ht 71.0 in | Wt 205.8 lb

## 2024-05-18 DIAGNOSIS — I4819 Other persistent atrial fibrillation: Secondary | ICD-10-CM | POA: Diagnosis not present

## 2024-05-18 DIAGNOSIS — I4891 Unspecified atrial fibrillation: Secondary | ICD-10-CM

## 2024-05-18 DIAGNOSIS — D6869 Other thrombophilia: Secondary | ICD-10-CM

## 2024-05-18 MED ORDER — AMLODIPINE BESYLATE 5 MG PO TABS: 5.0000 mg | ORAL_TABLET | Freq: Every day | ORAL | 6 refills | Status: DC

## 2024-05-18 MED ORDER — DILTIAZEM HCL ER COATED BEADS 120 MG PO CP24: 120.0000 mg | ORAL_CAPSULE | Freq: Every day | ORAL | 6 refills | Status: DC

## 2024-05-18 NOTE — Patient Instructions (Addendum)
Decrease Cardizem (Diltiazem) to 120mg  once a day

## 2024-05-18 NOTE — Progress Notes (Signed)
 Primary Care Physician: Dettinger, Fonda LABOR, MD Primary Cardiologist: Dr Jeffrie  Primary Electrophysiologist: Dr Cindie Referring Physician: Dr Dettinger   Louis [name] is a 68 y.o. male with a history of HTN, CAD, HLD, tobacco abuse, atrial fibrillation who presents for consultation in the [facility].  The patient was initially diagnosed with atrial fibrillation 01/2022 in the setting on pneumonia. Given the brevity of the episode and potential trigger, he was not started on anticoagulation at that time. He presented to the ED from his PCP office on 12/01/22 with generalized weakness and was found to be back in afib. He was started on IV diltiazem  and admitted. His diltiazem  was increased from 120 to 180 mg. He was started on Eliquis  for stroke prevention.   Patient returns for follow up for atrial fibrillation. He is in SR today. However, he has noted more fatigue recently. He denies chest pain or groin issues. No bleeding issues on anticoagulation.   Today, he  denies symptoms of palpitations, chest pain, shortness of breath, orthopnea, PND, lower extremity edema, dizziness, presyncope, syncope, bleeding, or neurologic sequela. The patient is tolerating medications without difficulties and is otherwise without complaint today.    Atrial Fibrillation Risk Factors:  he does have symptoms or diagnosis of sleep apnea. he does not have a history of rheumatic fever. he does have a history of alcohol use. The patient does not have a history of early familial atrial fibrillation or other arrhythmias.   Atrial Fibrillation Management history:  Previous antiarrhythmic drugs: none Previous cardioversions: 02/09/24 Previous ablations: 04/20/24 Anticoagulation history: Eliquis    Past Medical History:  Diagnosis Date   Anxiety    situational   DDD (degenerative disc disease), lumbar    Elevated white blood cell count    Hyperlipidemia    Hypertension     Pneumonia     Current Outpatient Medications  Medication Sig Dispense Refill   apixaban  (ELIQUIS ) 5 MG TABS tablet Take 1 tablet (5 mg total) by mouth 2 (two) times daily. 60 tablet 11   diltiazem  (CARDIZEM  CD) 180 MG 24 hr capsule TAKE 1 CAPSULE BY MOUTH EVERY DAY 90 capsule 3   fexofenadine (ALLEGRA) 180 MG tablet Take 180 mg by mouth every other day.     lisinopril  (ZESTRIL ) 40 MG tablet Take 1 tablet (40 mg total) by mouth daily. 90 tablet 3   pantoprazole  (PROTONIX ) 40 MG tablet Take 1 tablet (40 mg total) by mouth daily. 45 tablet 0   propranolol  ER (INDERAL  LA) 80 MG 24 hr capsule Take 1 capsule (80 mg total) by mouth daily. Hold if systolic blood pressure (top number) less than 100 mmHg or pulse less than 55 bpm. 90 capsule 1   rosuvastatin  (CRESTOR ) 20 MG tablet TAKE 1 TABLET BY MOUTH EVERY DAY 90 tablet 3   No current facility-administered medications for this encounter.    ROS- All systems are reviewed and negative except as per the HPI above.  Physical Exam: Vitals:   05/18/24 1011  BP: (!) 142/64  Pulse: (!) 43  Weight: 93.4 kg  Height: 5' 11 (1.803 m)    GEN: Well nourished, well developed in no acute distress CARDIAC: Regular rate and rhythm, no murmurs, rubs, gallops RESPIRATORY:  Clear to auscultation without rales, wheezing or rhonchi  ABDOMEN: Soft, non-tender, non-distended EXTREMITIES:  No edema; No deformity    Wt Readings from Last 3 Encounters:  05/18/24 93.4 kg  04/20/24 93.4 kg  03/30/24 93.4 kg  EKG today demonstrates  SB Vent. rate 43 BPM PR interval 162 ms QRS duration 96 ms QT/QTcB 474/400 ms   Echo 12/02/22 demonstrated 1. Left ventricular ejection fraction, by estimation, is 60 to 65%. The  left ventricle has normal function. The left ventricle has no regional  wall motion abnormalities. Left ventricular diastolic parameters were  normal.   2. Right ventricular systolic function is normal. The right ventricular  size is normal.    3. The mitral valve is normal in structure. No evidence of mitral valve  regurgitation. No evidence of mitral stenosis.   4. The aortic valve is normal in structure. Aortic valve regurgitation is  not visualized. No aortic stenosis is present.   5. The inferior vena cava is normal in size with greater than 50%  respiratory variability, suggesting right atrial pressure of 3 mmHg.   Epic records are reviewed at length today.  CHA2DS2-VASc Score = 3  The patient's score is based upon: CHF History: 0 HTN History: 1 Diabetes History: 0 Stroke History: 0 Vascular Disease History: 1 Age Score: 1 Gender Score: 0       ASSESSMENT AND PLAN: Persistent Atrial Fibrillation/atrial flutter (ICD10:  I48.19) The patient's CHA2DS2-VASc score is 3, indicating a 3.2% annual risk of stroke.   S/p afib and flutter ablation 04/20/24 He is in SR today, bradycardic.  Will decrease diltiazem  to 120 mg daily given bradycardia and fatigue.  Continue Eliquis  5 mg BID with no missed doses for 3 months post ablation.  Continue propranolol  80 mg daily (also takes for tremors)  Secondary Hypercoagulable State (ICD10:  D68.69) The patient is at significant risk for stroke/thromboembolism based upon his CHA2DS2-VASc Score of 3.  Continue Apixaban  (Eliquis ).   HTN Stable on current regimen If BP becomes elevated on lower dose of diltiazem  will need to consider alternate agent. He was on amlodipine  10 years ago and had significant lower extremity edema.   CAD CAC score 2106 PET stress 02/2024 showed no ischemia No anginal symptoms Followed by Dr Jeffrie   Follow up in the AF clinic in 2 months.    Daril Kicks PA-C Afib Clinic [facility] [address] [place], [place] [address] [phone] 05/18/2024 11:12 AM

## 2024-06-09 ENCOUNTER — Ambulatory Visit (INDEPENDENT_AMBULATORY_CARE_PROVIDER_SITE_OTHER): Admitting: Family Medicine

## 2024-06-09 ENCOUNTER — Encounter: Payer: Self-pay | Admitting: Family Medicine

## 2024-06-09 VITALS — BP 129/60 | HR 44 | Temp 98.0°F | Ht 71.0 in | Wt 205.0 lb

## 2024-06-09 DIAGNOSIS — J441 Chronic obstructive pulmonary disease with (acute) exacerbation: Secondary | ICD-10-CM

## 2024-06-09 DIAGNOSIS — R058 Other specified cough: Secondary | ICD-10-CM | POA: Diagnosis not present

## 2024-06-09 MED ORDER — FLUTICASONE PROPIONATE 50 MCG/ACT NA SUSP: 1.0000 | Freq: Two times a day (BID) | NASAL | 3 refills | Status: DC | PRN

## 2024-06-09 MED ORDER — PREDNISONE 20 MG PO TABS: ORAL_TABLET | ORAL | 0 refills | Status: DC

## 2024-06-09 MED ORDER — ALBUTEROL SULFATE HFA 108 (90 BASE) MCG/ACT IN AERS: 2.0000 | INHALATION_SPRAY | Freq: Four times a day (QID) | RESPIRATORY_TRACT | 0 refills | Status: DC | PRN

## 2024-06-09 NOTE — Progress Notes (Signed)
 BP 129/60   Pulse (!) 44   Temp 98 F (36.7 C)   Ht 5' 11 (1.803 m)   Wt 205 lb (93 kg)   SpO2 95%   BMI 28.59 kg/m    Subjective:   Patient ID: Louis [name], male    DOB: Aug 30, 1956, 68 y.o.   MRN: [id number]  HPI: Louis [name] is a 68 y.o. male presenting on 06/09/2024 for URI (x3w)   Discussed the use of AI scribe software for clinical note transcription with the patient, who gave verbal consent to proceed.  History of Present Illness   Louis [name] is a 68 year old male who presents with persistent cough and congestion for over three weeks.  He has been experiencing severe congestion and a persistent cough for over three weeks, with episodes of productive cough that provide some relief. No fevers or chills are present, but he notes a runny nose and some wheezing. Over-the-counter medications such as Mucinex  and Claritin have not significantly improved his symptoms.  He mentions that individuals at his workplace and his son, who resides in [place], have had similar symptoms, although he has since recovered. He has not been in contact with his son for over a month but maintains regular communication. No other household members have been affected.  He continues to take Eliquis , a blood thinner, and reports no issues with bleeding.  No fevers or chills reported. He denies sore throat, sinus pain, or lymph node swelling. He reports a runny nose and wheezing.          Relevant past medical, surgical, family and social history reviewed and updated as indicated. Interim medical history since our last visit reviewed. Allergies and medications reviewed and updated.  Review of Systems  Constitutional:  Negative for chills and fever.  HENT:  Positive for congestion, postnasal drip, rhinorrhea and sinus pressure. Negative for ear discharge, ear pain, sneezing, sore throat and voice change.   Eyes:  Negative for pain, discharge, redness and visual disturbance.  Respiratory:   Positive for cough and wheezing. Negative for chest tightness and shortness of breath.   Cardiovascular:  Negative for chest pain and leg swelling.  Gastrointestinal:  Negative for abdominal pain, constipation and diarrhea.  Genitourinary:  Negative for difficulty urinating.  Musculoskeletal:  Positive for myalgias. Negative for back pain and gait problem.  Skin:  Negative for rash.  Neurological:  Negative for syncope, light-headedness and headaches.  All other systems reviewed and are negative.   Per HPI unless specifically indicated above   Allergies as of 06/09/2024       Reactions   Penicillins Other (See Comments)   Occurred as child not sure what type of reaction he had   Tramadol Other (See Comments)   Patient states it made him feel like he had the flu Other Reaction(s): Fatigue        Medication List        Accurate as of June 09, 2024  9:26 AM. If you have any questions, ask your nurse or doctor.          STOP taking these medications    fexofenadine 180 MG tablet Commonly known as: ALLEGRA Stopped by: Fonda LABOR Maeghan Canny   pantoprazole  40 MG tablet Commonly known as: Protonix  Stopped by: Fonda LABOR Dorethy Tomey       TAKE these medications    albuterol  108 (90 Base) MCG/ACT inhaler Commonly known as: VENTOLIN  HFA Inhale 2 puffs into the lungs every 6 (six) hours  as needed for wheezing or shortness of breath. Started by: Fonda LABOR Evania Lyne   apixaban  5 MG Tabs tablet Commonly known as: ELIQUIS  Take 1 tablet (5 mg total) by mouth 2 (two) times daily.   diltiazem  120 MG 24 hr capsule Commonly known as: CARDIZEM  CD Take 1 capsule (120 mg total) by mouth daily.   fluticasone  50 MCG/ACT nasal spray Commonly known as: FLONASE  Place 1 spray into both nostrils 2 (two) times daily as needed for allergies or rhinitis. Started by: Fonda LABOR Keesha Pellum   lisinopril  40 MG tablet Commonly known as: ZESTRIL  Take 1 tablet (40 mg total) by mouth daily.    loratadine 10 MG tablet Commonly known as: CLARITIN Take 10 mg by mouth daily.   predniSONE  20 MG tablet Commonly known as: DELTASONE  2 po at same time daily for 5 days Started by: Fonda LABOR Bular Hickok   propranolol  ER 80 MG 24 hr capsule Commonly known as: INDERAL  LA Take 1 capsule (80 mg total) by mouth daily. Hold if systolic blood pressure (top number) less than 100 mmHg or pulse less than 55 bpm.   rosuvastatin  20 MG tablet Commonly known as: CRESTOR  TAKE 1 TABLET BY MOUTH EVERY DAY         Objective:   BP 129/60   Pulse (!) 44   Temp 98 F (36.7 C)   Ht 5' 11 (1.803 m)   Wt 205 lb (93 kg)   SpO2 95%   BMI 28.59 kg/m   Wt Readings from Last 3 Encounters:  06/09/24 205 lb (93 kg)  05/18/24 205 lb 12.8 oz (93.4 kg)  04/20/24 206 lb (93.4 kg)    Physical Exam Physical Exam   HEENT: Ears without redness or swelling. Postnasal drip noted, no throat redness. NECK: No cervical lymphadenopathy. CHEST: Mild wheezing in upper lung fields, rest of lungs clear to auscultation. CARDIOVASCULAR: Heart regular rate and rhythm, no murmurs.         Assessment & Plan:   Problem List Items Addressed This Visit   None Visit Diagnoses       Post-viral cough syndrome    -  Primary   Relevant Medications   fluticasone  (FLONASE ) 50 MCG/ACT nasal spray   predniSONE  (DELTASONE ) 20 MG tablet   albuterol  (VENTOLIN  HFA) 108 (90 Base) MCG/ACT inhaler     COPD exacerbation (HCC)       Relevant Medications   loratadine (CLARITIN) 10 MG tablet   fluticasone  (FLONASE ) 50 MCG/ACT nasal spray   predniSONE  (DELTASONE ) 20 MG tablet   albuterol  (VENTOLIN  HFA) 108 (90 Base) MCG/ACT inhaler           Chronic cough with congestion and wheezing Persistent cough with congestion and wheezing likely due to post-infectious inflammation. - Prescribed prednisone , 2 pills daily for 5 days, with food in the morning. - Prescribed Flonase  nasal spray. -Will also prescribe an albuterol   inhaler to use as needed - Continue Mucinex  and Claritin.  Atrial fibrillation, status post ablation, on anticoagulation Status post ablation for atrial fibrillation, on Eliquis  without complications. - Continue Eliquis .          Follow up plan: Return if symptoms worsen or fail to improve.  Counseling provided for all of the vaccine components No orders of the defined types were placed in this encounter.   Fonda Levins, MD [facility] 06/09/2024, 9:26 AM

## 2024-06-29 ENCOUNTER — Encounter: Payer: Self-pay | Admitting: Family Medicine

## 2024-06-29 ENCOUNTER — Ambulatory Visit (INDEPENDENT_AMBULATORY_CARE_PROVIDER_SITE_OTHER): Admitting: Family Medicine

## 2024-06-29 VITALS — BP 120/54 | HR 80 | Ht 71.0 in | Wt 202.0 lb

## 2024-06-29 DIAGNOSIS — Z0000 Encounter for general adult medical examination without abnormal findings: Secondary | ICD-10-CM

## 2024-06-29 DIAGNOSIS — Z23 Encounter for immunization: Secondary | ICD-10-CM | POA: Diagnosis not present

## 2024-06-29 NOTE — Progress Notes (Signed)
 Subjective:    Louis [name] is a 68 y.o. male who presents for a Welcome to Medicare exam.   Cardiac Risk Factors include: advanced age (>32men, >33 women);male gender;smoking/ tobacco exposure The 10-year ASCVD risk score (Arnett DK, et al., 2019) is: 11.6%   Values used to calculate the score:     Age: 54 years     Clincally relevant sex: Male     Is Non-Hispanic African American: No     Diabetic: No     Tobacco smoker: No     Systolic Blood Pressure: 120 mmHg     Is BP treated: Yes     HDL Cholesterol: 54 mg/dL     Total Cholesterol: 135 mg/dL      Objective:    Today's Vitals   06/29/24 1407  BP: (!) 120/54  Pulse: 80  SpO2: 98%  Weight: 202 lb (91.6 kg)  Height: 5' 11 (1.803 m)   Body mass index is 28.17 kg/m.  Medications Outpatient Encounter Medications as of 06/29/2024  Medication Sig   albuterol  (VENTOLIN  HFA) 108 (90 Base) MCG/ACT inhaler Inhale 2 puffs into the lungs every 6 (six) hours as needed for wheezing or shortness of breath.   apixaban  (ELIQUIS ) 5 MG TABS tablet Take 1 tablet (5 mg total) by mouth 2 (two) times daily.   diltiazem  (CARDIZEM  CD) 120 MG 24 hr capsule Take 1 capsule (120 mg total) by mouth daily.   fluticasone  (FLONASE ) 50 MCG/ACT nasal spray Place 1 spray into both nostrils 2 (two) times daily as needed for allergies or rhinitis.   lisinopril  (ZESTRIL ) 40 MG tablet Take 1 tablet (40 mg total) by mouth daily.   loratadine (CLARITIN) 10 MG tablet Take 10 mg by mouth daily.   predniSONE  (DELTASONE ) 20 MG tablet 2 po at same time daily for 5 days   propranolol  ER (INDERAL  LA) 80 MG 24 hr capsule Take 1 capsule (80 mg total) by mouth daily. Hold if systolic blood pressure (top number) less than 100 mmHg or pulse less than 55 bpm.   rosuvastatin  (CRESTOR ) 20 MG tablet TAKE 1 TABLET BY MOUTH EVERY DAY   No facility-administered encounter medications on file as of 06/29/2024.     History: Past Medical History:  Diagnosis Date   Anxiety     situational   DDD (degenerative disc disease), lumbar    Elevated white blood cell count    Hyperlipidemia    Hypertension    Pneumonia    Past Surgical History:  Procedure Laterality Date   ATRIAL FIBRILLATION ABLATION N/A 04/20/2024   Procedure: ATRIAL FIBRILLATION ABLATION;  Surgeon: Cindie Ole DASEN, MD;  Location: [facility]VASIVE [facility];  Service: Cardiovascular;  Laterality: N/A;   CARDIOVERSION N/A 02/09/2024   Procedure: CARDIOVERSION;  Surgeon: Michele Richardson, DO;  Location: [facility] CV [facility];  Service: Cardiovascular;  Laterality: N/A;   PAROTIDECTOMY Left 09/29/2022   Procedure: LEFT SUPERFICIAL PAROTIDECTOMY WITH FACIAL NERVE DISSECTION AND FACIAL NERVE MONITORING;  Surgeon: Jesus Oliphant, MD;  Location: [facility] OR;  Service: ENT;  Laterality: Left;   TONSILLECTOMY     TOTAL HIP ARTHROPLASTY Bilateral     Family History  Problem Relation Age of Onset   Cancer Mother        breast   Social History   Occupational History   Not on file  Tobacco Use   Smoking status: Former    Current packs/day: 0.00    Average packs/day: 0.5 packs/day for 37.9 years (19.0 ttl pk-yrs)  Types: Cigarettes    Start date: 08/17/1984    Quit date: 07/2022    Years since quitting: 1.9   Smokeless tobacco: Current    Types: Chew, Snuff   Tobacco comments:    Zen - nicotene pouch 4mg  of nicotene  Vaping Use   Vaping status: Never Used  Substance and Sexual Activity   Alcohol use: Yes    Alcohol/week: 3.0 standard drinks of alcohol    Types: 3 Standard drinks or equivalent per week    Comment: At least 3 mixed drinks on the weekend   Drug use: No   Sexual activity: Not on file    Tobacco Counseling Ready to quit: Not Answered Counseling given: Not Answered Tobacco comments: Zen - nicotene pouch 4mg  of nicotene   Immunizations and Health Maintenance Immunization History  Administered Date(s) Administered   Fluad Quad(high Dose 65+) 09/04/2022   INFLUENZA, HIGH DOSE SEASONAL PF  10/03/2023   Influenza,inj,Quad PF,6+ Mos 09/03/2015, 08/18/2016, 07/24/2017, 07/28/2018, 08/01/2019, 07/23/2020, 07/25/2021   Janssen (J&J) SARS-COV-2 Vaccination 12/23/2019   Td 06/02/2014   Tdap 06/02/2014, 11/28/2019   Health Maintenance Due  Topic Date Due   COVID-19 Vaccine (2 - Janssen risk series) 01/20/2020   Influenza Vaccine  04/15/2024    Activities of Daily Living    06/29/2024    2:18 PM 02/09/2024    8:53 AM  In your present state of health, do you have any difficulty performing the following activities:  Hearing? 0 0  Vision? 0 0  Difficulty concentrating or making decisions? 0 0  Walking or climbing stairs? 0   Dressing or bathing? 0   Doing errands, shopping? 0   Preparing Food and eating ? N   Using the Toilet? N   In the past six months, have you accidently leaked urine? N   Do you have problems with loss of bowel control? N   Managing your Medications? N   Managing your Finances? N   Housekeeping or managing your Housekeeping? N     Physical Exam   Physical Exam (optional), or other factors deemed appropriate based on the beneficiary's medical and social history and current clinical standards.   Advanced Directives: Does Patient Have a Medical Advance Directive?: No Would patient like information on creating a medical advance directive?: No - Patient declined, Yes (Inpatient - patient requests chaplain consult to create a medical advance directive)      Assessment:    This is a routine wellness  examination for this patient .   Vision/Hearing screen No results found.   Goals      Exercise 150 min/wk Moderate Activity         Depression Screen    06/29/2024    2:07 PM 06/09/2024    9:11 AM 03/30/2024    2:25 PM 03/09/2023    2:35 PM  PHQ 2/9 Scores  PHQ - 2 Score 1  1 2   PHQ- 9 Score   3   Exception Documentation  Patient refusal  Patient refusal     Fall Risk    06/29/2024    2:07 PM  Fall Risk   Falls in the past year? 0   Number falls in past yr: 0  Injury with Fall? 0  Follow up Falls evaluation completed    Cognitive Function        06/29/2024    2:23 PM  6CIT Screen  What Year? 0 points  What month? 0 points  What time? 0 points  Count back from 20 0 points  Months in reverse 0 points  Repeat phrase 2 points  Total Score 2 points    Patient Care Team: Arie Gable, Fonda LABOR, MD as PCP - General (Family Medicine) Jeffrie Oneil BROCKS, MD as PCP - Cardiology (Cardiology) Cindie Ole DASEN, MD as PCP - Electrophysiology (Cardiology)     Plan:     I have personally reviewed and noted the following in the patient's chart:   Medical and social history Use of alcohol, tobacco or illicit drugs  Current medications and supplements including opioid prescriptions. Patient is not currently taking opioid prescriptions. Functional ability and status Nutritional status Physical activity Advanced directives List of other physicians Hospitalizations, surgeries, and ER visits in previous 12 months Vitals Screenings to include cognitive, depression, and falls Referrals and appointments  In addition, I have reviewed and discussed with patient certain preventive protocols, quality metrics, and best practice recommendations. A written personalized care plan for preventive services as well as general preventive health recommendations were provided to patient.     Fonda LABOR Levins, MD 06/29/2024

## 2024-07-21 ENCOUNTER — Ambulatory Visit (HOSPITAL_COMMUNITY)
Admission: RE | Admit: 2024-07-21 | Discharge: 2024-07-21 | Disposition: A | Discharge status: Home / Self Care | Source: Ambulatory Visit | Attending: Physician Assistant | Admitting: Physician Assistant

## 2024-07-21 ENCOUNTER — Ambulatory Visit: Admitting: Pulmonary Disease

## 2024-07-21 VITALS — BP 162/70 | HR 50 | Ht 71.0 in | Wt 207.8 lb

## 2024-07-21 DIAGNOSIS — F1010 Alcohol abuse, uncomplicated: Secondary | ICD-10-CM

## 2024-07-21 DIAGNOSIS — I4891 Unspecified atrial fibrillation: Secondary | ICD-10-CM | POA: Diagnosis not present

## 2024-07-21 DIAGNOSIS — I4819 Other persistent atrial fibrillation: Secondary | ICD-10-CM | POA: Diagnosis not present

## 2024-07-21 DIAGNOSIS — D6869 Other thrombophilia: Secondary | ICD-10-CM | POA: Diagnosis not present

## 2024-07-21 DIAGNOSIS — R001 Bradycardia, unspecified: Secondary | ICD-10-CM | POA: Diagnosis not present

## 2024-07-21 NOTE — Patient Instructions (Signed)
 Stop diltiazem  --- monitor BP -- if becomes elevated let us  know

## 2024-07-21 NOTE — Progress Notes (Signed)
 Primary Care Physician: Dettinger, Fonda LABOR, MD Primary Cardiologist: Dr Jeffrie  Primary Electrophysiologist: Dr Cindie Referring Physician: Dr Dettinger   Louis [name] is a 68 y.o. male with a history of HTN, CAD, HLD, tobacco abuse, atrial fibrillation who presents for consultation in the [facility].  The patient was initially diagnosed with atrial fibrillation 01/2022 in the setting on pneumonia. Given the brevity of the episode and potential trigger, he was not started on anticoagulation at that time. He presented to the ED from his PCP office on 12/01/22 with generalized weakness and was found to be back in afib. He was started on IV diltiazem  and admitted. His diltiazem  was increased from 120 to 180 mg. He was started on Eliquis  for stroke prevention. S/p afib and flutter ablation with Dr Cindie on 04/20/24.  Patient returns for follow up for atrial fibrillation. He is in SR today. He does still report fatigue with exertion. No bleeding issues on anticoagulation.   Today, he  denies symptoms of palpitations, chest pain, shortness of breath, orthopnea, PND, lower extremity edema, dizziness, presyncope, syncope, bleeding, or neurologic sequela. The patient is tolerating medications without difficulties and is otherwise without complaint today.    Atrial Fibrillation Risk Factors:  he does have symptoms or diagnosis of sleep apnea. he does not have a history of rheumatic fever. he does have a history of alcohol use. The patient does not have a history of early familial atrial fibrillation or other arrhythmias.   Atrial Fibrillation Management history:  Previous antiarrhythmic drugs: none Previous cardioversions: 02/09/24 Previous ablations: 04/20/24 Anticoagulation history: Eliquis    Past Medical History:  Diagnosis Date   Anxiety    situational   DDD (degenerative disc disease), lumbar    Elevated white blood cell count    Hyperlipidemia     Hypertension    Pneumonia     Current Outpatient Medications  Medication Sig Dispense Refill   albuterol  (VENTOLIN  HFA) 108 (90 Base) MCG/ACT inhaler Inhale 2 puffs into the lungs every 6 (six) hours as needed for wheezing or shortness of breath. 8 g 0   apixaban  (ELIQUIS ) 5 MG TABS tablet Take 1 tablet (5 mg total) by mouth 2 (two) times daily. 60 tablet 11   diltiazem  (CARDIZEM  CD) 120 MG 24 hr capsule Take 1 capsule (120 mg total) by mouth daily. 30 capsule 6   fluticasone  (FLONASE ) 50 MCG/ACT nasal spray Place 1 spray into both nostrils 2 (two) times daily as needed for allergies or rhinitis. 16 g 3   lisinopril  (ZESTRIL ) 40 MG tablet Take 1 tablet (40 mg total) by mouth daily. 90 tablet 3   loratadine (CLARITIN) 10 MG tablet Take 10 mg by mouth daily.     propranolol  ER (INDERAL  LA) 80 MG 24 hr capsule Take 1 capsule (80 mg total) by mouth daily. Hold if systolic blood pressure (top number) less than 100 mmHg or pulse less than 55 bpm. 90 capsule 1   rosuvastatin  (CRESTOR ) 20 MG tablet TAKE 1 TABLET BY MOUTH EVERY DAY 90 tablet 3   No current facility-administered medications for this encounter.    ROS- All systems are reviewed and negative except as per the HPI above.  Physical Exam: Vitals:   07/21/24 1431  BP: (!) 162/70  Pulse: (!) 50  Weight: 94.3 kg  Height: 5' 11 (1.803 m)    GEN: Well nourished, well developed in no acute distress CARDIAC: Regular rate and rhythm with occasional ectopy, no murmurs, rubs,  gallops RESPIRATORY:  Clear to auscultation without rales, wheezing or rhonchi  ABDOMEN: Soft, non-tender, non-distended EXTREMITIES:  No edema; No deformity    Wt Readings from Last 3 Encounters:  07/21/24 94.3 kg  06/29/24 91.6 kg  06/09/24 93 kg    EKG today demonstrates  SB with trigeminal PACs Vent. rate 50 BPM PR interval 156 ms QRS duration 94 ms QT/QTcB 468/426 ms   Echo 12/02/22 demonstrated 1. Left ventricular ejection fraction, by estimation,  is 60 to 65%. The  left ventricle has normal function. The left ventricle has no regional  wall motion abnormalities. Left ventricular diastolic parameters were  normal.   2. Right ventricular systolic function is normal. The right ventricular  size is normal.   3. The mitral valve is normal in structure. No evidence of mitral valve  regurgitation. No evidence of mitral stenosis.   4. The aortic valve is normal in structure. Aortic valve regurgitation is  not visualized. No aortic stenosis is present.   5. The inferior vena cava is normal in size with greater than 50%  respiratory variability, suggesting right atrial pressure of 3 mmHg.   Epic records are reviewed at length today.   CHA2DS2-VASc Score = 3  The patient's score is based upon: CHF History: 0 HTN History: 1 Diabetes History: 0 Stroke History: 0 Vascular Disease History: 1 Age Score: 1 Gender Score: 0       ASSESSMENT AND PLAN: Persistent Atrial Fibrillation/atrial flutter (ICD10:  I48.19) The patient's CHA2DS2-VASc score is 3, indicating a 3.2% annual risk of stroke.   S/p afib and flutter ablation 04/20/24 He remains bradycardic with fatigue. ECG reviewed with EP. Will stop diltiazem . Continue Eliquis  5 mg BID Continue propranolol  80 mg daily (tremors)  Secondary Hypercoagulable State (ICD10:  D68.69) The patient is at significant risk for stroke/thromboembolism based upon his CHA2DS2-VASc Score of 3.  Continue Apixaban  (Eliquis ). No bleeding issues.   HTN Elevated BP today, well controlled on home BP machine.  Changes as above. Can consider amlodipine  if needed.   CAD CAC score 2106 PET stress 02/2024 showed no ischemia No anginal symptoms Followed by Dr Jeffrie   Follow up in the AF clinic in 3 months.    Daril Kicks PA-C [facility] [address] [place], [place] [address] [phone] 07/21/2024 3:24 PM

## 2024-08-09 ENCOUNTER — Encounter: Payer: Self-pay | Admitting: Cardiology

## 2024-09-04 ENCOUNTER — Other Ambulatory Visit: Payer: Self-pay | Admitting: Family Medicine

## 2024-09-04 DIAGNOSIS — R058 Other specified cough: Secondary | ICD-10-CM

## 2024-09-20 ENCOUNTER — Other Ambulatory Visit (HOSPITAL_COMMUNITY): Payer: Self-pay

## 2024-09-21 ENCOUNTER — Encounter: Payer: Self-pay | Admitting: Family Medicine

## 2024-09-21 ENCOUNTER — Ambulatory Visit: Payer: Self-pay | Admitting: Family Medicine

## 2024-09-21 VITALS — BP 140/69 | HR 60 | Ht 71.0 in | Wt 203.0 lb

## 2024-09-21 DIAGNOSIS — I4819 Other persistent atrial fibrillation: Secondary | ICD-10-CM | POA: Diagnosis not present

## 2024-09-21 DIAGNOSIS — Z0001 Encounter for general adult medical examination with abnormal findings: Secondary | ICD-10-CM | POA: Diagnosis not present

## 2024-09-21 DIAGNOSIS — E782 Mixed hyperlipidemia: Secondary | ICD-10-CM | POA: Diagnosis not present

## 2024-09-21 DIAGNOSIS — G250 Essential tremor: Secondary | ICD-10-CM | POA: Diagnosis not present

## 2024-09-21 DIAGNOSIS — J42 Unspecified chronic bronchitis: Secondary | ICD-10-CM | POA: Diagnosis not present

## 2024-09-21 DIAGNOSIS — Z0000 Encounter for general adult medical examination without abnormal findings: Secondary | ICD-10-CM

## 2024-09-21 DIAGNOSIS — I10 Essential (primary) hypertension: Secondary | ICD-10-CM | POA: Diagnosis not present

## 2024-09-21 MED ORDER — PROPRANOLOL HCL ER 80 MG PO CP24: 80.0000 mg | ORAL_CAPSULE | Freq: Every day | ORAL | 1 refills | Status: AC

## 2024-09-21 MED ORDER — ALBUTEROL SULFATE HFA 108 (90 BASE) MCG/ACT IN AERS: 2.0000 | INHALATION_SPRAY | Freq: Four times a day (QID) | RESPIRATORY_TRACT | 0 refills | Status: AC | PRN

## 2024-09-21 NOTE — Progress Notes (Signed)
 "  BP (!) 140/69   Pulse 60   Ht 5' 11 (1.803 m)   Wt 203 lb (92.1 kg)   SpO2 95%   BMI 28.31 kg/m    Subjective:   Patient ID: Louis [name], male    DOB: 01/05/1956, 69 y.o.   MRN: [id number]  HPI: Louis [name] is a 69 y.o. male presenting on 09/21/2024 for Medical Management of Chronic Issues and Hyperlipidemia   Discussed the use of AI scribe software for clinical note transcription with the patient, who gave verbal consent to proceed.  History of Present Illness   Louis [name] is a 69 year old male with hypertension, essential tremor, hyperlipidemia, and atrial fibrillation who presents for a six-month recheck and physical exam.  Hypertension - Blood pressure at home typically runs around 129 systolic. - Continues lisinopril  and propranolol  for blood pressure management.  Essential tremor - Tremors fluctuate, with some days better than others. - Tremors worsen with fatigue. - Propranolol  dosage was previously lowered due to side effects. - Continues propranolol  for tremor management.  Hyperlipidemia - Continues rosuvastatin  therapy. - No issues with rosuvastatin . - Awaiting blood work results to assess cholesterol levels.  Atrial fibrillation - Under care of cardiologist. - Experiencing fewer symptoms and improved exercise tolerance ('more wind'). - Taking Eliquis  for anticoagulation. - No significant bleeding issues, but bruises easily with minor trauma. - Status post ablation procedure with gradual improvement.          Relevant past medical, surgical, family and social history reviewed and updated as indicated. Interim medical history since our last visit reviewed. Allergies and medications reviewed and updated.  Review of Systems  Constitutional:  Negative for chills and fever.  Eyes:  Negative for visual disturbance.  Respiratory:  Positive for shortness of breath (On exertion). Negative for wheezing.   Cardiovascular:  Positive for palpitations. Negative  for chest pain and leg swelling.  Musculoskeletal:  Negative for back pain and gait problem.  Skin:  Negative for rash.  All other systems reviewed and are negative.   Per HPI unless specifically indicated above   Allergies as of 09/21/2024       Reactions   Penicillins Other (See Comments)   Occurred as child not sure what type of reaction he had   Tramadol Other (See Comments)   Patient states it made him feel like he had the flu Other Reaction(s): Fatigue        Medication List        Accurate as of September 21, 2024  2:22 PM. If you have any questions, ask your nurse or doctor.          albuterol  108 (90 Base) MCG/ACT inhaler Commonly known as: VENTOLIN  HFA Inhale 2 puffs into the lungs every 6 (six) hours as needed for wheezing or shortness of breath.   apixaban  5 MG Tabs tablet Commonly known as: ELIQUIS  Take 1 tablet (5 mg total) by mouth 2 (two) times daily.   fluticasone  50 MCG/ACT nasal spray Commonly known as: FLONASE  PLACE 1 SPRAY INTO BOTH NOSTRILS 2 (TWO) TIMES DAILY AS NEEDED FOR ALLERGIES OR RHINITIS.   lisinopril  40 MG tablet Commonly known as: ZESTRIL  Take 1 tablet (40 mg total) by mouth daily.   loratadine 10 MG tablet Commonly known as: CLARITIN Take 10 mg by mouth daily.   propranolol  ER 80 MG 24 hr capsule Commonly known as: INDERAL  LA Take 1 capsule (80 mg total) by mouth daily. Hold if systolic blood pressure (top  number) less than 100 mmHg or pulse less than 55 bpm.   rosuvastatin  20 MG tablet Commonly known as: CRESTOR  TAKE 1 TABLET BY MOUTH EVERY DAY         Objective:   BP (!) 140/69   Pulse 60   Ht 5' 11 (1.803 m)   Wt 203 lb (92.1 kg)   SpO2 95%   BMI 28.31 kg/m   Wt Readings from Last 3 Encounters:  09/21/24 203 lb (92.1 kg)  07/21/24 207 lb 12.8 oz (94.3 kg)  06/29/24 202 lb (91.6 kg)    Physical Exam Physical Exam   VITALS: BP- 140/69 NECK: Thyroid  without nodules. CHEST: Lungs clear to auscultation  bilaterally. CARDIOVASCULAR: Irregular heart rhythm consistent with AFib, good rate. ABDOMEN: No kidney tenderness. EXTREMITIES: No edema, good pulses bilaterally.         Assessment & Plan:   Problem List Items Addressed This Visit       Cardiovascular and Mediastinum   HTN (hypertension)   Relevant Medications   propranolol  ER (INDERAL  LA) 80 MG 24 hr capsule   Other Relevant Orders   CBC with Differential/Platelet   CMP14+EGFR   Lipid panel   TSH   Persistent atrial fibrillation (HCC)   Relevant Medications   propranolol  ER (INDERAL  LA) 80 MG 24 hr capsule   Other Relevant Orders   CBC with Differential/Platelet   CMP14+EGFR   Lipid panel   TSH     Respiratory   Chronic bronchitis (HCC)   Relevant Medications   albuterol  (VENTOLIN  HFA) 108 (90 Base) MCG/ACT inhaler     Other   HLD (hyperlipidemia)   Relevant Medications   propranolol  ER (INDERAL  LA) 80 MG 24 hr capsule   Other Visit Diagnoses       Physical exam    -  Primary   Relevant Orders   CBC with Differential/Platelet   CMP14+EGFR   Lipid panel   TSH     Benign essential tremor       Relevant Medications   propranolol  ER (INDERAL  LA) 80 MG 24 hr capsule          Persistent atrial fibrillation Improvement post-ablation with fewer flutters and increased energy. Heart rate irregular but acceptable. No bleeding complications from Eliquis , minor bruising noted. - Continue Eliquis  for anticoagulation. - Follow up with cardiologist in February.  Primary hypertension Blood pressure borderline elevated in office, satisfactory home readings. - Continue lisinopril  and propranolol . - Monitor blood pressure at home once or twice a month.  Benign essential tremor Managed with propranolol . Symptoms fluctuate with fatigue and rest. Dosage previously reduced due to side effects. - Continue propranolol  for tremor management. - Ensure adequate rest to manage symptoms.  Mixed hyperlipidemia Managed with  rosuvastatin . - Continue rosuvastatin . - Checked cholesterol levels with blood work.  General Health Maintenance Routine health maintenance discussed. - Performed blood work. - Scheduled follow-up appointment in six months.          Follow up plan: Return in about 6 months (around 03/21/2025), or if symptoms worsen or fail to improve, for Hypertension and hyperlipidemia and COPD recheck.  Counseling provided for all of the vaccine components Orders Placed This Encounter  Procedures   CBC with Differential/Platelet   CMP14+EGFR   Lipid panel   TSH    Fonda Levins, MD [facility] 09/21/2024, 2:22 PM     "

## 2024-09-22 LAB — CBC WITH DIFFERENTIAL/PLATELET
Basophils Absolute: 0 x10E3/uL (ref 0.0–0.2)
Basos: 0 %
EOS (ABSOLUTE): 0.2 x10E3/uL (ref 0.0–0.4)
Eos: 2 %
Hematocrit: 45 % (ref 37.5–51.0)
Hemoglobin: 15.3 g/dL (ref 13.0–17.7)
Immature Grans (Abs): 0 x10E3/uL (ref 0.0–0.1)
Immature Granulocytes: 0 %
Lymphocytes Absolute: 1.6 x10E3/uL (ref 0.7–3.1)
Lymphs: 23 %
MCH: 33.3 pg — ABNORMAL HIGH (ref 26.6–33.0)
MCHC: 34 g/dL (ref 31.5–35.7)
MCV: 98 fL — ABNORMAL HIGH (ref 79–97)
Monocytes Absolute: 0.6 x10E3/uL (ref 0.1–0.9)
Monocytes: 8 %
Neutrophils Absolute: 4.6 x10E3/uL (ref 1.4–7.0)
Neutrophils: 67 %
Platelets: 194 x10E3/uL (ref 150–450)
RBC: 4.59 x10E6/uL (ref 4.14–5.80)
RDW: 12.8 % (ref 11.6–15.4)
WBC: 6.9 x10E3/uL (ref 3.4–10.8)

## 2024-09-22 LAB — CMP14+EGFR
ALT: 37 IU/L (ref 0–44)
AST: 38 IU/L (ref 0–40)
Albumin: 4.5 g/dL (ref 3.9–4.9)
Alkaline Phosphatase: 70 IU/L (ref 47–123)
BUN/Creatinine Ratio: 12 (ref 10–24)
BUN: 11 mg/dL (ref 8–27)
Bilirubin Total: 0.7 mg/dL (ref 0.0–1.2)
CO2: 31 mmol/L — ABNORMAL HIGH (ref 20–29)
Calcium: 9.2 mg/dL (ref 8.6–10.2)
Chloride: 102 mmol/L (ref 96–106)
Creatinine, Ser: 0.95 mg/dL (ref 0.76–1.27)
Globulin, Total: 2.2 g/dL (ref 1.5–4.5)
Glucose: 102 mg/dL — ABNORMAL HIGH (ref 70–99)
Potassium: 4.6 mmol/L (ref 3.5–5.2)
Sodium: 145 mmol/L — ABNORMAL HIGH (ref 134–144)
Total Protein: 6.7 g/dL (ref 6.0–8.5)
eGFR: 88 mL/min/1.73

## 2024-09-22 LAB — LIPID PANEL
Chol/HDL Ratio: 2 ratio (ref 0.0–5.0)
Cholesterol, Total: 122 mg/dL (ref 100–199)
HDL: 61 mg/dL
LDL Chol Calc (NIH): 48 mg/dL (ref 0–99)
Triglycerides: 58 mg/dL (ref 0–149)
VLDL Cholesterol Cal: 13 mg/dL (ref 5–40)

## 2024-09-22 LAB — TSH: TSH: 0.253 u[IU]/mL — ABNORMAL LOW (ref 0.450–4.500)

## 2024-09-28 ENCOUNTER — Ambulatory Visit: Payer: Self-pay | Admitting: Family Medicine

## 2024-10-20 ENCOUNTER — Ambulatory Visit (HOSPITAL_COMMUNITY)
Admission: RE | Admit: 2024-10-20 | Discharge: 2024-10-20 | Disposition: A | Source: Ambulatory Visit | Attending: Physician Assistant | Admitting: Physician Assistant

## 2024-10-20 VITALS — BP 192/90 | HR 49 | Ht 71.0 in | Wt 206.2 lb

## 2024-10-20 DIAGNOSIS — I4891 Unspecified atrial fibrillation: Secondary | ICD-10-CM | POA: Diagnosis not present

## 2024-10-20 DIAGNOSIS — I4819 Other persistent atrial fibrillation: Secondary | ICD-10-CM | POA: Diagnosis not present

## 2024-10-20 DIAGNOSIS — D6869 Other thrombophilia: Secondary | ICD-10-CM | POA: Diagnosis not present

## 2024-10-20 DIAGNOSIS — I10 Essential (primary) hypertension: Secondary | ICD-10-CM | POA: Diagnosis not present

## 2024-10-20 MED ORDER — AMLODIPINE BESYLATE 2.5 MG PO TABS: 2.5000 mg | ORAL_TABLET | Freq: Every day | ORAL | 3 refills | Status: AC

## 2024-10-20 NOTE — Patient Instructions (Signed)
 Start Amlodipine  2.5mg  once a day  Follow up with Dr Jeffrie office in 2-3 weeks  Follow up with new EP MD

## 2024-10-20 NOTE — Progress Notes (Signed)
 "   Primary Care Physician: Dettinger, Fonda LABOR, MD Primary Cardiologist: Dr Jeffrie  Primary Electrophysiologist: Dr Cindie Referring Physician: Dr Dettinger   Louis [name] is a 69 y.o. male with a history of HTN, CAD, HLD, tobacco abuse, atrial fibrillation who presents for consultation in the [facility].  The patient was initially diagnosed with atrial fibrillation 01/2022 in the setting on pneumonia. Given the brevity of the episode and potential trigger, he was not started on anticoagulation at that time. He presented to the ED from his PCP office on 12/01/22 with generalized weakness and was found to be back in afib. He was started on IV diltiazem  and admitted. His diltiazem  was increased from 120 to 180 mg. He was started on Eliquis  for stroke prevention. S/p afib and flutter ablation with Dr Cindie on 04/20/24.  Patient returns for follow up for atrial fibrillation. Discussed the use of AI scribe software for clinical note transcription with the patient, who gave verbal consent to proceed.  Patient in SR today and feels well. He feels less short of breath since discontinuing diltiazem , which was causing his heart rate to slow excessively. He reports no significant bleeding issues with his blood thinner. He reports being an amlodipine  remotely and having lower extremity edema but has tolerated diltiazem  without edema.      Today, he  denies symptoms of palpitations, chest pain, shortness of breath, orthopnea, PND, lower extremity edema, dizziness, presyncope, syncope, bleeding, or neurologic sequela. The patient is tolerating medications without difficulties and is otherwise without complaint today.    Atrial Fibrillation Risk Factors:  he does have symptoms or diagnosis of sleep apnea. he does not have a history of rheumatic fever. he does have a history of alcohol use. The patient does not have a history of early familial atrial fibrillation or other  arrhythmias.   Atrial Fibrillation Management history:  Previous antiarrhythmic drugs: none Previous cardioversions: 02/09/24 Previous ablations: 04/20/24 Anticoagulation history: Eliquis    Past Medical History:  Diagnosis Date   Anxiety    situational   DDD (degenerative disc disease), lumbar    Elevated white blood cell count    Hyperlipidemia    Hypertension    Pneumonia     Current Outpatient Medications  Medication Sig Dispense Refill   albuterol  (VENTOLIN  HFA) 108 (90 Base) MCG/ACT inhaler Inhale 2 puffs into the lungs every 6 (six) hours as needed for wheezing or shortness of breath. 8 g 0   amLODipine  (NORVASC ) 2.5 MG tablet Take 1 tablet (2.5 mg total) by mouth daily. 30 tablet 3   apixaban  (ELIQUIS ) 5 MG TABS tablet Take 1 tablet (5 mg total) by mouth 2 (two) times daily. 60 tablet 11   fluticasone  (FLONASE ) 50 MCG/ACT nasal spray PLACE 1 SPRAY INTO BOTH NOSTRILS 2 (TWO) TIMES DAILY AS NEEDED FOR ALLERGIES OR RHINITIS. 48 mL 1   lisinopril  (ZESTRIL ) 40 MG tablet Take 1 tablet (40 mg total) by mouth daily. 90 tablet 3   loratadine (CLARITIN) 10 MG tablet Take 10 mg by mouth daily. (Patient taking differently: Take 10 mg by mouth as needed.)     propranolol  ER (INDERAL  LA) 80 MG 24 hr capsule Take 1 capsule (80 mg total) by mouth daily. Hold if systolic blood pressure (top number) less than 100 mmHg or pulse less than 55 bpm. 90 capsule 1   rosuvastatin  (CRESTOR ) 20 MG tablet TAKE 1 TABLET BY MOUTH EVERY DAY 90 tablet 3   No current facility-administered medications for  this encounter.    ROS- All systems are reviewed and negative except as per the HPI above.  Physical Exam: Vitals:   10/20/24 1527  BP: (!) 192/90  Pulse: (!) 49  Weight: 93.5 kg  Height: 5' 11 (1.803 m)    GEN: Well nourished, well developed in no acute distress CARDIAC: Regular rate and rhythm with occasional ectopy, no murmurs, rubs, gallops RESPIRATORY:  Clear to auscultation without rales,  wheezing or rhonchi  ABDOMEN: Soft, non-tender, non-distended EXTREMITIES:  No edema; No deformity    Wt Readings from Last 3 Encounters:  10/20/24 93.5 kg  09/21/24 92.1 kg  07/21/24 94.3 kg    EKG Interpretation Date/Time:  Thursday October 20 2024 15:31:39 EST Ventricular Rate:  49 PR Interval:  150 QRS Duration:  94 QT Interval:  460 QTC Calculation: 415 R Axis:   82  Text Interpretation: Sinus bradycardia with Blocked Premature atrial complexes Otherwise normal ECG When compared with ECG of 21-Jul-2024 14:57, No significant change was found Confirmed by Kennidy Lamke ([id number]) on 10/20/2024 3:57:12 PM    Echo 12/02/22 demonstrated 1. Left ventricular ejection fraction, by estimation, is 60 to 65%. The  left ventricle has normal function. The left ventricle has no regional  wall motion abnormalities. Left ventricular diastolic parameters were  normal.   2. Right ventricular systolic function is normal. The right ventricular  size is normal.   3. The mitral valve is normal in structure. No evidence of mitral valve  regurgitation. No evidence of mitral stenosis.   4. The aortic valve is normal in structure. Aortic valve regurgitation is  not visualized. No aortic stenosis is present.   5. The inferior vena cava is normal in size with greater than 50%  respiratory variability, suggesting right atrial pressure of 3 mmHg.   Epic records are reviewed at length today.   CHA2DS2-VASc Score = 3  The patient's score is based upon: CHF History: 0 HTN History: 1 Diabetes History: 0 Stroke History: 0 Vascular Disease History: 1 Age Score: 1 Gender Score: 0       ASSESSMENT AND PLAN: Persistent Atrial Fibrillation/atrial flutter (ICD10:  I48.19) The patient's CHA2DS2-VASc score is 3, indicating a 3.2% annual risk of stroke.   S/p afib and flutter ablation 04/20/24 Patient appears to be maintaining SR Continue Eliquis  5 mg BID Continue propranolol  80 mg daily  (tremors) Diltiazem  previously stopped due to bradycardia and fatigue.   Secondary Hypercoagulable State (ICD10:  D68.69) The patient is at significant risk for stroke/thromboembolism based upon his CHA2DS2-VASc Score of 3.  Continue Apixaban  (Eliquis ). No bleeding issues.   HTN Elevated today.  Will start amlodipine  2.5 mg daily Follow up with primary cardiology team for titration in 2 weeks.   CAD CAC score 2106 PET stress test 02/2024 showed no ischemia No anginal symptoms Followed by Dr Jeffrie   Follow up with Dr Jeffrie or APP in 2 weeks. EP MD in 6 months to establish care.    Daril Kicks PA-C Afib Clinic [facility] [address] [place], [place] [address] [phone] 10/20/2024 3:57 PM  "
